# Patient Record
Sex: Female | Born: 1955 | Race: Black or African American | Hispanic: No | Smoking: Never smoker
Health system: Southern US, Community
[De-identification: ages and names within clinical notes are randomized; demographics above are authoritative.]

## PROBLEM LIST (undated history)

## (undated) DIAGNOSIS — R87619 Unspecified abnormal cytological findings in specimens from cervix uteri: Secondary | ICD-10-CM

## (undated) DIAGNOSIS — D573 Sickle-cell trait: Secondary | ICD-10-CM

## (undated) DIAGNOSIS — M1990 Unspecified osteoarthritis, unspecified site: Secondary | ICD-10-CM

## (undated) DIAGNOSIS — D259 Leiomyoma of uterus, unspecified: Secondary | ICD-10-CM

## (undated) DIAGNOSIS — H9192 Unspecified hearing loss, left ear: Secondary | ICD-10-CM

## (undated) DIAGNOSIS — F411 Generalized anxiety disorder: Secondary | ICD-10-CM

## (undated) DIAGNOSIS — D649 Anemia, unspecified: Secondary | ICD-10-CM

## (undated) DIAGNOSIS — F329 Major depressive disorder, single episode, unspecified: Secondary | ICD-10-CM

## (undated) DIAGNOSIS — H269 Unspecified cataract: Secondary | ICD-10-CM

## (undated) DIAGNOSIS — J302 Other seasonal allergic rhinitis: Secondary | ICD-10-CM

## (undated) DIAGNOSIS — N83209 Unspecified ovarian cyst, unspecified side: Secondary | ICD-10-CM

## (undated) DIAGNOSIS — K219 Gastro-esophageal reflux disease without esophagitis: Secondary | ICD-10-CM

## (undated) DIAGNOSIS — M8580 Other specified disorders of bone density and structure, unspecified site: Secondary | ICD-10-CM

## (undated) DIAGNOSIS — E785 Hyperlipidemia, unspecified: Secondary | ICD-10-CM

## (undated) DIAGNOSIS — F32A Depression, unspecified: Secondary | ICD-10-CM

## (undated) HISTORY — DX: Generalized anxiety disorder: F41.1

## (undated) HISTORY — PX: ENDOMETRIAL ABLATION: SHX621

## (undated) HISTORY — DX: Unspecified cataract: H26.9

## (undated) HISTORY — DX: Sickle-cell trait: D57.3

## (undated) HISTORY — DX: Other specified disorders of bone density and structure, unspecified site: M85.80

## (undated) HISTORY — DX: Leiomyoma of uterus, unspecified: D25.9

## (undated) HISTORY — DX: Unspecified abnormal cytological findings in specimens from cervix uteri: R87.619

## (undated) HISTORY — PX: COLONOSCOPY: SHX174

## (undated) HISTORY — PX: POLYPECTOMY: SHX149

## (undated) HISTORY — DX: Unspecified ovarian cyst, unspecified side: N83.209

## (undated) HISTORY — DX: Unspecified hearing loss, left ear: H91.92

## (undated) HISTORY — DX: Gastro-esophageal reflux disease without esophagitis: K21.9

## (undated) HISTORY — PX: MYOMECTOMY: SHX85

## (undated) HISTORY — DX: Other seasonal allergic rhinitis: J30.2

---

## 2001-09-16 ENCOUNTER — Other Ambulatory Visit: Admission: RE | Admit: 2001-09-16 | Discharge: 2001-09-16 | Payer: Self-pay | Admitting: Internal Medicine

## 2006-12-29 DIAGNOSIS — H9192 Unspecified hearing loss, left ear: Secondary | ICD-10-CM

## 2006-12-29 HISTORY — DX: Unspecified hearing loss, left ear: H91.92

## 2008-01-28 ENCOUNTER — Encounter: Payer: Self-pay | Admitting: Gastroenterology

## 2008-01-28 ENCOUNTER — Emergency Department (HOSPITAL_COMMUNITY): Admission: EM | Admit: 2008-01-28 | Discharge: 2008-01-28 | Payer: Self-pay | Admitting: Emergency Medicine

## 2008-02-09 ENCOUNTER — Ambulatory Visit: Payer: Self-pay | Admitting: Gastroenterology

## 2008-02-23 ENCOUNTER — Ambulatory Visit: Payer: Self-pay | Admitting: Gastroenterology

## 2008-02-23 ENCOUNTER — Encounter: Payer: Self-pay | Admitting: Gastroenterology

## 2008-11-30 ENCOUNTER — Encounter: Payer: Self-pay | Admitting: Gastroenterology

## 2009-03-05 ENCOUNTER — Encounter: Payer: Self-pay | Admitting: Gastroenterology

## 2009-03-19 ENCOUNTER — Telehealth: Payer: Self-pay | Admitting: Gastroenterology

## 2009-04-17 ENCOUNTER — Ambulatory Visit: Payer: Self-pay | Admitting: Gastroenterology

## 2009-04-17 DIAGNOSIS — R142 Eructation: Secondary | ICD-10-CM

## 2009-04-17 DIAGNOSIS — R1011 Right upper quadrant pain: Secondary | ICD-10-CM | POA: Insufficient documentation

## 2009-04-17 DIAGNOSIS — R143 Flatulence: Secondary | ICD-10-CM

## 2009-04-17 DIAGNOSIS — R141 Gas pain: Secondary | ICD-10-CM | POA: Insufficient documentation

## 2009-04-17 DIAGNOSIS — K219 Gastro-esophageal reflux disease without esophagitis: Secondary | ICD-10-CM | POA: Insufficient documentation

## 2009-04-24 ENCOUNTER — Ambulatory Visit: Payer: Self-pay | Admitting: Gastroenterology

## 2009-04-25 ENCOUNTER — Telehealth: Payer: Self-pay | Admitting: Gastroenterology

## 2009-09-27 ENCOUNTER — Encounter: Admission: RE | Admit: 2009-09-27 | Discharge: 2009-09-27 | Payer: Self-pay | Admitting: Family Medicine

## 2011-01-19 ENCOUNTER — Encounter: Payer: Self-pay | Admitting: Internal Medicine

## 2011-02-21 ENCOUNTER — Ambulatory Visit
Admission: RE | Admit: 2011-02-21 | Discharge: 2011-02-21 | Disposition: A | Discharge status: Home / Self Care | Payer: 59 | Source: Ambulatory Visit | Attending: Internal Medicine | Admitting: Internal Medicine

## 2011-02-21 ENCOUNTER — Other Ambulatory Visit: Payer: Self-pay | Admitting: Internal Medicine

## 2011-02-27 ENCOUNTER — Emergency Department (HOSPITAL_COMMUNITY)
Admission: EM | Admit: 2011-02-27 | Discharge: 2011-02-27 | Disposition: A | Discharge status: Home / Self Care | Payer: 59 | Attending: Emergency Medicine | Admitting: Emergency Medicine

## 2011-02-27 ENCOUNTER — Emergency Department (HOSPITAL_COMMUNITY): Payer: 59

## 2011-02-27 DIAGNOSIS — R002 Palpitations: Secondary | ICD-10-CM | POA: Insufficient documentation

## 2011-02-27 DIAGNOSIS — F411 Generalized anxiety disorder: Secondary | ICD-10-CM | POA: Insufficient documentation

## 2011-02-27 DIAGNOSIS — R110 Nausea: Secondary | ICD-10-CM | POA: Insufficient documentation

## 2011-02-27 DIAGNOSIS — R000 Tachycardia, unspecified: Secondary | ICD-10-CM | POA: Insufficient documentation

## 2011-02-27 DIAGNOSIS — F410 Panic disorder [episodic paroxysmal anxiety] without agoraphobia: Secondary | ICD-10-CM | POA: Insufficient documentation

## 2011-02-27 DIAGNOSIS — R0602 Shortness of breath: Secondary | ICD-10-CM | POA: Insufficient documentation

## 2011-02-27 DIAGNOSIS — R0989 Other specified symptoms and signs involving the circulatory and respiratory systems: Secondary | ICD-10-CM | POA: Insufficient documentation

## 2011-02-27 DIAGNOSIS — Z79899 Other long term (current) drug therapy: Secondary | ICD-10-CM | POA: Insufficient documentation

## 2011-02-27 DIAGNOSIS — K219 Gastro-esophageal reflux disease without esophagitis: Secondary | ICD-10-CM | POA: Insufficient documentation

## 2011-02-27 DIAGNOSIS — R0609 Other forms of dyspnea: Secondary | ICD-10-CM | POA: Insufficient documentation

## 2011-02-27 LAB — CBC
MCHC: 35.8 g/dL (ref 30.0–36.0)
MCV: 86 fL (ref 78.0–100.0)
Platelets: 247 10*3/uL (ref 150–400)
RDW: 13.3 % (ref 11.5–15.5)
WBC: 8.5 10*3/uL (ref 4.0–10.5)

## 2011-02-27 LAB — BASIC METABOLIC PANEL
BUN: 12 mg/dL (ref 6–23)
Calcium: 9 mg/dL (ref 8.4–10.5)
GFR calc non Af Amer: 52 mL/min — ABNORMAL LOW (ref >60)
Glucose, Bld: 102 mg/dL — ABNORMAL HIGH (ref 70–99)
Potassium: 4.2 mEq/L (ref 3.5–5.1)

## 2011-02-27 LAB — POCT CARDIAC MARKERS
Myoglobin, poc: 62.2 ng/mL (ref 12–200)
Troponin i, poc: <0.05 ng/mL (ref 0.00–0.09)

## 2011-07-07 ENCOUNTER — Other Ambulatory Visit: Payer: Self-pay | Admitting: Obstetrics & Gynecology

## 2011-09-18 LAB — URINALYSIS, ROUTINE W REFLEX MICROSCOPIC
Nitrite: NEGATIVE
Protein, ur: NEGATIVE
Specific Gravity, Urine: 1.008
Urobilinogen, UA: 0.2

## 2011-09-18 LAB — COMPREHENSIVE METABOLIC PANEL
Alkaline Phosphatase: 35 — ABNORMAL LOW
BUN: 7
CO2: 26
Chloride: 103
GFR calc non Af Amer: 58 — ABNORMAL LOW
Glucose, Bld: 112 — ABNORMAL HIGH
Potassium: 3.8
Total Bilirubin: 1
Total Protein: 6.7

## 2011-09-18 LAB — DIFFERENTIAL
Basophils Absolute: 0
Basophils Relative: 0
Eosinophils Absolute: 0
Neutro Abs: 7.6
Neutrophils Relative %: 84 — ABNORMAL HIGH

## 2011-09-18 LAB — CBC
HCT: 44.2
Hemoglobin: 15
MCHC: 34
MCV: 92
Platelets: 239
RBC: 4.8
RDW: 13.3
WBC: 9.1

## 2011-10-18 ENCOUNTER — Emergency Department (HOSPITAL_COMMUNITY)
Admission: EM | Admit: 2011-10-18 | Discharge: 2011-10-18 | Disposition: A | Discharge status: Home / Self Care | Payer: 59 | Attending: Emergency Medicine | Admitting: Emergency Medicine

## 2011-10-18 DIAGNOSIS — F411 Generalized anxiety disorder: Secondary | ICD-10-CM | POA: Insufficient documentation

## 2011-10-18 DIAGNOSIS — K219 Gastro-esophageal reflux disease without esophagitis: Secondary | ICD-10-CM | POA: Insufficient documentation

## 2011-10-18 DIAGNOSIS — IMO0002 Reserved for concepts with insufficient information to code with codable children: Secondary | ICD-10-CM | POA: Insufficient documentation

## 2011-10-18 DIAGNOSIS — S71009A Unspecified open wound, unspecified hip, initial encounter: Secondary | ICD-10-CM | POA: Insufficient documentation

## 2011-10-18 DIAGNOSIS — Z79899 Other long term (current) drug therapy: Secondary | ICD-10-CM | POA: Insufficient documentation

## 2011-10-18 DIAGNOSIS — W540XXA Bitten by dog, initial encounter: Secondary | ICD-10-CM | POA: Insufficient documentation

## 2011-10-18 DIAGNOSIS — S71109A Unspecified open wound, unspecified thigh, initial encounter: Secondary | ICD-10-CM | POA: Insufficient documentation

## 2011-11-17 ENCOUNTER — Ambulatory Visit (INDEPENDENT_AMBULATORY_CARE_PROVIDER_SITE_OTHER): Payer: Managed Care, Other (non HMO) | Admitting: Family Medicine

## 2011-11-17 ENCOUNTER — Encounter: Payer: Self-pay | Admitting: Family Medicine

## 2011-11-17 VITALS — BP 118/70 | HR 72 | Temp 98.0°F | Ht 66.0 in | Wt 156.0 lb

## 2011-11-17 DIAGNOSIS — N390 Urinary tract infection, site not specified: Secondary | ICD-10-CM

## 2011-11-17 DIAGNOSIS — F411 Generalized anxiety disorder: Secondary | ICD-10-CM | POA: Insufficient documentation

## 2011-11-17 DIAGNOSIS — R300 Dysuria: Secondary | ICD-10-CM

## 2011-11-17 LAB — POCT URINALYSIS DIPSTICK
Bilirubin, UA: NEGATIVE
Ketones, UA: NEGATIVE
Urobilinogen, UA: NEGATIVE
pH, UA: 5

## 2011-11-17 MED ORDER — SULFAMETHOXAZOLE-TRIMETHOPRIM 800-160 MG PO TABS: 1.0000 | ORAL_TABLET | Freq: Two times a day (BID) | ORAL | Status: AC

## 2011-11-17 NOTE — Patient Instructions (Signed)
Please take the antibiotics as directed.  Call if your symptoms progress or worsen.  We will contact you with culture results if it is found that you either don't have an infection, or that the prescribed antibiotics aren't effective and need to be changed.   Continue to drink plenty of fluids.  We discussed your anxiety today.  Consider taking a daily medication for prevention, if you continue to have ongoing, daily anxiety. Please follow up to discuss this further if you are interested.

## 2011-11-17 NOTE — Progress Notes (Signed)
5 days ago noticed faint pink on the toilet paper after wiping after urinating. Only occurred one time. She had been traveling, and not drinking adequate fluids.  Denies any discomfort with voiding--no stinging or burning; denies urgency or frequency.  She gets UTI's every 1-2 years, hasn't had one yet this year, but this felt different than her previous UTI's.  Presents to the office today to make sure there is no infection.  Has had an ache on the right side of her umbilicus for about a month, comes and goes.  Also has had some suprapubic discomfort for a while.  Last saw her GYN in July--has known fibroids and ovarian cyst.  Past Medical History  Diagnosis Date  . GERD (gastroesophageal reflux disease)     EGD at [facility]  . Anxiety state, unspecified     on daily xanax for a couple of years  . Fibroid uterus     GYN--Dr. Seymour Bars  . Ovarian cyst   . Sickle cell trait     Past Surgical History  Procedure Date  . Myomectomy     Dr. Seymour Bars  . Endometrial ablation     History   Social History  . Marital Status: Married    Spouse Name: N/A    Number of Children: 1  . Years of Education: N/A   Occupational History  . works from home.  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 Smoking status: Never Smoker   . Smokeless tobacco: Never Used  . Alcohol Use: Yes     2-3 drinks per week.  . Drug Use: No  . Sexually Active: Not on file   Other Topics Concern  . Not on file   Social History Narrative   Just got her Ph.D. In July.  Lives with her husband, mother and 70 year old daughter    Family History  Problem Relation Age of Onset  . Hyperlipidemia Mother   . Hypertension Mother   . Diabetes Mother   . Dementia Mother   . Irritable bowel syndrome Mother   . Alcohol abuse Father   . Kidney disease Father   . Ulcers Father   . Allergies Daughter   . Eczema Daughter   . Cancer Maternal Aunt     breast cancer (60's)  . Cancer  Maternal Uncle 70    colon  . Diabetes Maternal Grandfather   . Stroke Maternal Grandfather     Current outpatient prescriptions:ALPRAZolam (XANAX) 0.25 MG tablet, Take 0.25 mg by mouth every morning.  , Disp: , Rfl: ;  cholecalciferol (VITAMIN D) 1000 UNITS tablet, Take 1,000 Units by mouth daily.  , Disp: , Rfl: ;  cyanocobalamin 100 MCG tablet, Take 100 mcg by mouth daily.  , Disp: , Rfl: ;  cyproheptadine (PERIACTIN) 4 MG tablet, Take 4 mg by mouth at bedtime as needed.  , Disp: , Rfl:  dexlansoprazole (DEXILANT) 60 MG capsule, Take 60 mg by mouth daily.  , Disp: , Rfl: ;  folic acid (FOLVITE) 400 MCG tablet, Take 400 mcg by mouth daily.  , Disp: , Rfl: ;  Iron-Vitamins (GERITOL PO), Take 1 tablet by mouth.  , Disp: , Rfl: ;  Magnesium 500 MG TABS, Take 1 tablet by mouth daily.  , Disp: , Rfl: ;  Probiotic Product (RESTORA) CAPS, Take 1 capsule by mouth daily.  , Disp: , Rfl:  vitamin E 100 UNIT capsule, Take 100 Units by mouth daily.  ,  Disp: , Rfl: ;  zinc gluconate 50 MG tablet, Take 50 mg by mouth daily.  , Disp: , Rfl: ;  sulfamethoxazole-trimethoprim (SEPTRA DS) 800-160 MG per tablet, Take 1 tablet by mouth 2 (two) times daily., Disp: 14 tablet, Rfl: 0  Allergies  Allergen Reactions  . Augmentin Diarrhea   ROS: Denies any fevers.  Has had some lower back pain, thinks due to her mattress, for the last few weeks, intermittent.  Denies h/o kidney stones. No nausea, vomiting, diarrhea or other bowel changes.  No fevers, URI symptoms, shortness of breath, chest pain, headaches, other joint pains, skin rashes or other concerns. +anxiety.  Taking xanax most mornings for anxiety.  Got her PhD over the summer.  Already a little anxious about her only daughter leaving for college next year.  PHYSICAL EXAM: BP 118/70  Pulse 72  Temp(Src) 98 F (36.7 C) (Oral)  Ht 5\' 6"  (1.676 m)  Wt 156 lb (70.761 kg)  BMI 25.18 kg/m2 Well developed, pleasant female in no distress HEENT: PERRL, EOMI,  conjunctiva clear Neck: no lymphadenopathy or thyromegaly Heart: regular rate and rhythm without murmur Lungs: clear bilaterally Abdomen:  Soft.  No organomegaly or mass.  Tender just to the right of umbilicus--slight defect in fascia, but no bulging or evidence of hernia.  Tender in suprapubic area, and slightly to the right.  No masses appreciable Extremities: no edema Skin: no rash Psych: normal mood, affect, hygiene and grooming  Urine dip--trace blood and 2+ leukocytes  ASSESSMENT/PLAN: 1. Urinary tract infection, site not specified  Urine Culture, sulfamethoxazole-trimethoprim (SEPTRA DS) 800-160 MG per tablet  2. Dysuria  POCT Urinalysis Dipstick  3. Anxiety state, unspecified     Discussed anxiety--using xanax daily.  To consider preventative med instead.  Will f/u to discuss in future

## 2011-11-21 LAB — URINE CULTURE

## 2011-11-24 ENCOUNTER — Other Ambulatory Visit: Payer: Self-pay | Admitting: *Deleted

## 2011-11-24 DIAGNOSIS — N390 Urinary tract infection, site not specified: Secondary | ICD-10-CM

## 2011-11-24 MED ORDER — NITROFURANTOIN MONOHYD MACRO 100 MG PO CAPS: 100.0000 mg | ORAL_CAPSULE | Freq: Two times a day (BID) | ORAL | Status: DC

## 2011-12-08 ENCOUNTER — Ambulatory Visit: Payer: Managed Care, Other (non HMO) | Admitting: Family Medicine

## 2011-12-10 ENCOUNTER — Other Ambulatory Visit: Payer: Managed Care, Other (non HMO)

## 2011-12-10 DIAGNOSIS — Z79899 Other long term (current) drug therapy: Secondary | ICD-10-CM

## 2011-12-10 DIAGNOSIS — E7800 Pure hypercholesterolemia, unspecified: Secondary | ICD-10-CM

## 2011-12-10 DIAGNOSIS — E559 Vitamin D deficiency, unspecified: Secondary | ICD-10-CM

## 2011-12-10 LAB — COMPREHENSIVE METABOLIC PANEL
Alkaline Phosphatase: 44 U/L (ref 39–117)
CO2: 28 mEq/L (ref 19–32)
Creat: 1.11 mg/dL — ABNORMAL HIGH (ref 0.50–1.10)
Glucose, Bld: 80 mg/dL (ref 70–99)
Sodium: 140 mEq/L (ref 135–145)
Total Bilirubin: 0.7 mg/dL (ref 0.3–1.2)
Total Protein: 6.8 g/dL (ref 6.0–8.3)

## 2011-12-10 LAB — LIPID PANEL
Cholesterol: 219 mg/dL — ABNORMAL HIGH (ref 0–200)
HDL: 54 mg/dL (ref >39)
Total CHOL/HDL Ratio: 4.1 Ratio
VLDL: 19 mg/dL (ref 0–40)

## 2011-12-10 LAB — MAGNESIUM: Magnesium: 2 mg/dL (ref 1.5–2.5)

## 2011-12-11 LAB — VITAMIN D 25 HYDROXY (VIT D DEFICIENCY, FRACTURES): Vit D, 25-Hydroxy: 48 ng/mL (ref 30–89)

## 2011-12-15 ENCOUNTER — Encounter: Payer: Self-pay | Admitting: Family Medicine

## 2011-12-15 ENCOUNTER — Ambulatory Visit (INDEPENDENT_AMBULATORY_CARE_PROVIDER_SITE_OTHER): Payer: Managed Care, Other (non HMO) | Admitting: Family Medicine

## 2011-12-15 VITALS — BP 104/72 | HR 68 | Ht 66.0 in | Wt 157.0 lb

## 2011-12-15 DIAGNOSIS — E7800 Pure hypercholesterolemia, unspecified: Secondary | ICD-10-CM

## 2011-12-15 DIAGNOSIS — Z0000 Encounter for general adult medical examination without abnormal findings: Secondary | ICD-10-CM

## 2011-12-15 LAB — POCT URINALYSIS DIPSTICK
Bilirubin, UA: NEGATIVE
Glucose, UA: NEGATIVE
Ketones, UA: NEGATIVE
Leukocytes, UA: NEGATIVE
Nitrite, UA: NEGATIVE

## 2011-12-15 NOTE — Progress Notes (Signed)
Laura Kirby is a 55 y.o. female who presents for a complete physical.  She has the following concerns: annual exam, labs done 12/10/11. Dr Seymour Bars is GYN.-up to date on PAP.  Left leg.,dog bite 10/18/11-treated in ER and by former physician, wants to know if you can take a look at it, not sure if it is healing properly  10 days ago had shooting pains in L thigh where dog bite was, resolved on its own after one day.  Some stingling and slight residual pain  F/u labs she had done prior to her appointment.  She has had elevated cholesterol in the past.  Has been eating more Gouda cheese recently.  Lipids from 11/2010 showed TChol 193, LDL 119.  Labs: Lab Results  Component Value Date   CHOL 219* 12/10/2011   HDL 54 12/10/2011   LDLCALC 146* 12/10/2011   TRIG 94 12/10/2011   CHOLHDL 4.1 12/10/2011     Chemistry      Component Value Date/Time   NA 140 12/10/2011 1521   K 4.0 12/10/2011 1521   CL 105 12/10/2011 1521   CO2 28 12/10/2011 1521   BUN 13 12/10/2011 1521   CREATININE 1.11* 12/10/2011 1521   CREATININE 1.10 02/27/2011 0534      Component Value Date/Time   CALCIUM 9.4 12/10/2011 1521   ALKPHOS 44 12/10/2011 1521   AST 17 12/10/2011 1521   ALT 18 12/10/2011 1521   BILITOT 0.7 12/10/2011 1521     Lab Results  Component Value Date   WBC 8.5 02/27/2011   HGB 14.3 02/27/2011   HCT 40.0 02/27/2011   MCV 86.0 02/27/2011   PLT 247 02/27/2011   Vitamin D was normal at 48 Magnesium level normal at 2  Health Maintenance: Immunization History  Administered Date(s) Administered  . Td 11/17/2005   She had tetanus shot given in ER 10/18/11 when she had dog bite (doesn't state whether it was Td or TdaP) Doesn't take flu shots Last Pap smear: July 2012 with Dr. Seymour Bars Last mammogram: July 2012 Last colonoscopy: 2009, + polyps, due again in 2013  Last DEXA: never Dentist: over a year ago Ophtho: last year Exercise: yoga daily.  Not getting cardiovascular  Past Medical  History  Diagnosis Date  . GERD (gastroesophageal reflux disease)     EGD at [facility]  . Anxiety state, unspecified     on daily xanax for a couple of years in past  . Fibroid uterus     GYN--Dr. Seymour Bars  . Ovarian cyst   . Sickle cell trait     Past Surgical History  Procedure Date  . Myomectomy     Dr. Seymour Bars  . Endometrial ablation     History   Social History  . Marital Status: Married    Spouse Name: N/A    Number of Children: 1  . Years of Education: N/A   Occupational History  . works from home.  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 Smoking status: Never Smoker   . Smokeless tobacco: Never Used  . Alcohol Use: Yes     2-3 drinks per week.  . Drug Use: No  . Sexually Active: Yes -- Female partner(s)    Birth Control/ Protection: Post-menopausal   Other Topics Concern  . Not on file   Social History Narrative   Just got her Ph.D. In July.  Lives with her husband, mother and 17 year old daughter  Family History  Problem Relation Age of Onset  . Hyperlipidemia Mother   . Hypertension Mother   . Diabetes Mother   . Dementia Mother   . Irritable bowel syndrome Mother   . Alcohol abuse Father   . Kidney disease Father   . Ulcers Father   . Allergies Daughter   . Eczema Daughter   . Cancer Maternal Aunt     breast cancer (60's)  . Cancer Maternal Uncle 70    colon  . Diabetes Maternal Grandfather   . Stroke Maternal Grandfather    Current Outpatient Prescriptions on File Prior to Visit  Medication Sig Dispense Refill  . cholecalciferol (VITAMIN D) 1000 UNITS tablet Take 1,000 Units by mouth daily.        . cyanocobalamin 100 MCG tablet Take 100 mcg by mouth daily.        . cyproheptadine (PERIACTIN) 4 MG tablet Take 4 mg by mouth at bedtime as needed.        Marland Kitchen dexlansoprazole (DEXILANT) 60 MG capsule Take 60 mg by mouth daily.        . folic acid (FOLVITE) 400 MCG tablet Take 400 mcg by mouth daily.         . Iron-Vitamins (GERITOL PO) Take 1 tablet by mouth.        . Magnesium 500 MG TABS Take 1 tablet by mouth daily.        . Probiotic Product (RESTORA) CAPS Take 1 capsule by mouth daily.        . vitamin E 100 UNIT capsule Take 100 Units by mouth daily.        Marland Kitchen zinc gluconate 50 MG tablet Take 50 mg by mouth daily.        Marland Kitchen ALPRAZolam (XANAX) 0.25 MG tablet Take 0.25 mg by mouth every morning.          Allergies  Allergen Reactions  . Augmentin Diarrhea   ROS: The patient denies anorexia, fever, weight changes, headaches,  vision changes, decreased hearing, ear pain, sore throat, breast concerns, chest pain, palpitations, dizziness, syncope, dyspnea on exertion, cough, swelling, nausea, vomiting, diarrhea, constipation, abdominal pain, melena, hematochezia, indigestion/heartburn, hematuria, incontinence, dysuria, irregular menstrual cycles, vaginal discharge, odor or itch, genital lesions, joint pains, numbness, tingling, weakness, tremor, suspicious skin lesions, depression, anxiety, abnormal bleeding/bruising (has persistent bruise/lesion L thigh from dogbite), or enlarged lymph nodes. Hasn't needed xanax in over a month.  C/o Pain at night L anterior elbow at antecubital fossa, and radiates up the arm to the shoulder.  Only hurts when she lies on her left side.  Symptoms lasted x 2 weeks, and is improving now.  Better since back on her Magnesium.  Has known carpal tunnel on left, and sometimes gets tingling into L hand.  PHYSICAL EXAM: BP 104/72  Pulse 68  Ht 5\' 6"  (1.676 m)  Wt 157 lb (71.215 kg)  BMI 25.34 kg/m2  General Appearance:    Alert, cooperative, no distress, appears stated age  Head:    Normocephalic, without obvious abnormality, atraumatic  Eyes:    PERRL, conjunctiva/corneas clear, EOM's intact, fundi    benign  Ears:    Normal TM's and external ear canals  Nose:   Nares normal, mucosa normal, no drainage or sinus   tenderness  Throat:   Lips, mucosa, and tongue  normal; teeth and gums normal  Neck:   Supple, no lymphadenopathy;  thyroid:  no   enlargement/tenderness/nodules; no carotid   bruit or JVD  Back:    Spine nontender, no curvature, ROM normal, no CVA     tenderness  Lungs:     Clear to auscultation bilaterally without wheezes, rales or     ronchi; respirations unlabored  Chest Wall:    No tenderness or deformity   Heart:    Regular rate and rhythm, S1 and S2 normal, no murmur, rub   or gallop  Breast Exam:    Deferred to GYN  Abdomen:     Soft, non-tender, nondistended, normoactive bowel sounds,    no masses, no hepatosplenomegaly. Mild diffuse tenderness over uterus, slightly nodular (known fibroids)  Genitalia:    Deferred to GYN     Extremities:   No clubbing, cyanosis or edema. Normal Left shoulder and elbow exam--strength 5/5 and no pain with strength testing. L outer thigh--multiple healing puncture wounds with some scar tissue.  Central area is somewhat raised/firm, slightly tender.  No erythema, warmth, induration or fluctuance.  Likely residual hematoma (?)   Pulses:   2+ and symmetric all extremities  Skin:   Skin color, texture, turgor normal, no rashes or lesions  Lymph nodes:   Cervical, supraclavicular, and axillary nodes normal  Neurologic:   CNII-XII intact, normal strength, sensation and gait; reflexes 2+ and sym metric throughout          Psych:   Normal mood, affect, hygiene and grooming.    ASSESSMENT/PLAN:  1. Routine general medical examination at a health care facility  POCT Urinalysis Dipstick, Visual acuity screening  2. Pure hypercholesterolemia  Lipid panel   Low cholesterol diet reviewed. Re-check in 6 months  L arm pain--discussed differential diagnosis with normal exam--?strain, ?cervical disk issues.  Symptoms seems to be resolving.  Trial of NSAID's prn  Discussed monthly self breast exams and yearly mammograms after the age of 52; at least 30 minutes of aerobic activity at least 5 days/week; proper  sunscreen use reviewed; healthy diet, including goals of calcium and vitamin D intake and alcohol recommendations (less than or equal to 1 drink/day) reviewed; regular seatbelt use; changing batteries in smoke detectors.  Immunization recommendations discussed--flu shot declined. To try and find out if Tdap vs Td given in ER so can be entered in Epic.  Colonoscopy recommendations reviewed--UTD

## 2011-12-15 NOTE — Patient Instructions (Addendum)
HEALTH MAINTENANCE RECOMMENDATIONS:  It is recommended that you get at least 30 minutes of aerobic exercise at least 5 days/week (for weight loss, you may need as much as 60-90 minutes). This can be any activity that gets your heart rate up. This can be divided in 10-15 minute intervals if needed, but try and build up your endurance at least once a week.  Weight bearing exercise is also recommended twice weekly.  Eat a healthy diet with lots of vegetables, fruits and fiber.  "Colorful" foods have a lot of vitamins (ie green vegetables, tomatoes, red peppers, etc).  Limit sweet tea, regular sodas and alcoholic beverages, all of which has a lot of calories and sugar.  Up to 1 alcoholic drink daily may be beneficial for women (unless trying to lose weight, watch sugars).  Drink a lot of water.  Calcium recommendations are 1200-1500 mg daily (1500 mg for postmenopausal women or women without ovaries), and vitamin D 1000 IU daily.  This should be obtained from diet and/or supplements (vitamins), and calcium should not be taken all at once, but in divided doses.  Monthly self breast exams and yearly mammograms for women over the age of 49 is recommended.  Sunscreen of at least SPF 30 should be used on all sun-exposed parts of the skin when outside between the hours of 10 am and 4 pm (not just when at beach or pool, but even with exercise, golf, tennis, and yard work!)  Use a sunscreen that says "broad spectrum" so it covers both UVA and UVB rays, and make sure to reapply every 1-2 hours.  Remember to change the batteries in your smoke detectors when changing your clock times in the spring and fall.  Use your seat belt every time you are in a car, and please drive safely and not be distracted with cell phones and texting while driving.  Please see if you can find out if you were given Td or TdaP (type of tetanus shot) at your ER visit so I can enter properly in the computer. Try and cut back on cheese,  creamy things, red meat, butter, egg yolks, etc to help lower your cholesterol.  See info below.  Left arm pain--? Etiology.  If pain persists, trial of anti-inflammatories (ie Aleve or Motrin).  Follow up if symptoms progress or worsen, or new symptoms develop   Cholesterol Control Diet Cholesterol levels in your body are determined significantly by your diet. Cholesterol levels may also be related to heart disease. The following material helps to explain this relationship and discusses what you can do to help keep your heart healthy. Not all cholesterol is bad. Low-density lipoprotein (LDL) cholesterol is the "bad" cholesterol. It may cause fatty deposits to build up inside your arteries. High-density lipoprotein (HDL) cholesterol is "good." It helps to remove the "bad" LDL cholesterol from your blood. Cholesterol is a very important risk factor for heart disease. Other risk factors are high blood pressure, smoking, stress, heredity, and weight. The heart muscle gets its supply of blood through the coronary arteries. If your LDL cholesterol is high and your HDL cholesterol is low, you are at risk for having fatty deposits build up in your coronary arteries. This leaves less room through which blood can flow. Without sufficient blood and oxygen, the heart muscle cannot function properly and you may feel chest pains (angina pectoris). When a coronary artery closes up entirely, a part of the heart muscle may die, causing a heart attack (myocardial infarction).  CHECKING CHOLESTEROL When your caregiver sends your blood to a lab to be analyzed for cholesterol, a complete lipid (fat) profile may be done. With this test, the total amount of cholesterol and levels of LDL and HDL are determined. Triglycerides are a type of fat that circulates in the blood and can also be used to determine heart disease risk. The list below describes what the numbers should be: Test: Total Cholesterol.  Less than 200 mg/dl.    Test: LDL "bad cholesterol."  Less than 100 mg/dl.   Less than 70 mg/dl if you are at very high risk of a heart attack or sudden cardiac death.  Test: HDL "good cholesterol."  Greater than 50 mg/dl for women.   Greater than 40 mg/dl for men.  Test: Triglycerides.  Less than 150 mg/dl.  CONTROLLING CHOLESTEROL WITH DIET Although exercise and lifestyle factors are important, your diet is key. That is because certain foods are known to raise cholesterol and others to lower it. The goal is to balance foods for their effect on cholesterol and more importantly, to replace saturated and trans fat with other types of fat, such as monounsaturated fat, polyunsaturated fat, and omega-3 fatty acids. On average, a person should consume no more than 15 to 17 g of saturated fat daily. Saturated and trans fats are considered "bad" fats, and they will raise LDL cholesterol. Saturated fats are primarily found in animal products such as meats, butter, and cream. However, that does not mean you need to sacrifice all your favorite foods. Today, there are good tasting, low-fat, low-cholesterol substitutes for most of the things you like to eat. Choose low-fat or nonfat alternatives. Choose round or loin cuts of red meat, since these types of cuts are lowest in fat and cholesterol. Chicken (without the skin), fish, veal, and ground [place] breast are excellent choices. Eliminate fatty meats, such as hot dogs and salami. Even shellfish have little or no saturated fat. Have a 3 oz (85 g) portion when you eat lean meat, poultry, or fish. Trans fats are also called "partially hydrogenated oils." They are oils that have been scientifically manipulated so that they are solid at room temperature resulting in a longer shelf life and improved taste and texture of foods in which they are added. Trans fats are found in stick margarine, some tub margarines, cookies, crackers, and baked goods.  When baking and cooking, oils are an  excellent substitute for butter. The monounsaturated oils are especially beneficial since it is believed they lower LDL and raise HDL. The oils you should avoid entirely are saturated tropical oils, such as coconut and palm.  Remember to eat liberally from food groups that are naturally free of saturated and trans fat, including fish, fruit, vegetables, beans, grains (barley, rice, couscous, bulgur wheat), and pasta (without cream sauces).  IDENTIFYING FOODS THAT LOWER CHOLESTEROL  Soluble fiber may lower your cholesterol. This type of fiber is found in fruits such as apples, vegetables such as broccoli, potatoes, and carrots, legumes such as beans, peas, and lentils, and grains such as barley. Foods fortified with plant sterols (phytosterol) may also lower cholesterol. You should eat at least 2 g per day of these foods for a cholesterol lowering effect.  Read package labels to identify low-saturated fats, trans fats free, and low-fat foods at the supermarket. Select cheeses that have only 2 to 3 g saturated fat per ounce. Use a heart-healthy tub margarine that is free of trans fats or partially hydrogenated oil. When buying  baked goods (cookies, crackers), avoid partially hydrogenated oils. Breads and muffins should be made from whole grains (whole-wheat or whole oat flour, instead of "flour" or "enriched flour"). Buy non-creamy canned soups with reduced salt and no added fats.  FOOD PREPARATION TECHNIQUES  Never deep-fry. If you must fry, either stir-fry, which uses very little fat, or use non-stick cooking sprays. When possible, broil, bake, or roast meats, and steam vegetables. Instead of dressing vegetables with butter or margarine, use lemon and herbs, applesauce and cinnamon (for squash and sweet potatoes), nonfat yogurt, salsa, and low-fat dressings for salads.  LOW-SATURATED FAT / LOW-FAT FOOD SUBSTITUTES Meats / Saturated Fat (g)  Avoid: Steak, marbled (3 oz/85 g) / 11 g   Choose: Steak, lean  (3 oz/85 g) / 4 g   Avoid: Hamburger (3 oz/85 g) / 7 g   Choose: Hamburger, lean (3 oz/85 g) / 5 g   Avoid: Ham (3 oz/85 g) / 6 g   Choose: Ham, lean cut (3 oz/85 g) / 2.4 g   Avoid: Chicken, with skin, dark meat (3 oz/85 g) / 4 g   Choose: Chicken, skin removed, dark meat (3 oz/85 g) / 2 g   Avoid: Chicken, with skin, light meat (3 oz/85 g) / 2.5 g   Choose: Chicken, skin removed, light meat (3 oz/85 g) / 1 g  Dairy / Saturated Fat (g)  Avoid: Whole milk (1 cup) / 5 g   Choose: Low-fat milk, 2% (1 cup) / 3 g   Choose: Low-fat milk, 1% (1 cup) / 1.5 g   Choose: Skim milk (1 cup) / 0.3 g   Avoid: Hard cheese (1 oz/28 g) / 6 g   Choose: Skim milk cheese (1 oz/28 g) / 2 to 3 g   Avoid: Cottage cheese, 4% fat (1 cup) / 6.5 g   Choose: Low-fat cottage cheese, 1% fat (1 cup) / 1.5 g   Avoid: Ice cream (1 cup) / 9 g   Choose: Sherbet (1 cup) / 2.5 g   Choose: Nonfat frozen yogurt (1 cup) / 0.3 g   Choose: Frozen fruit bar / trace   Avoid: Whipped cream (1 tbs) / 3.5 g   Choose: Nondairy whipped topping (1 tbs) / 1 g  Condiments / Saturated Fat (g)  Avoid: Mayonnaise (1 tbs) / 2 g   Choose: Low-fat mayonnaise (1 tbs) / 1 g   Avoid: Butter (1 tbs) / 7 g   Choose: Extra light margarine (1 tbs) / 1 g   Avoid: Coconut oil (1 tbs) / 11.8 g   Choose: Olive oil (1 tbs) / 1.8 g   Choose: Corn oil (1 tbs) / 1.7 g   Choose: Safflower oil (1 tbs) / 1.2 g   Choose: Sunflower oil (1 tbs) / 1.4 g   Choose: Soybean oil (1 tbs) / 2.4 g   Choose: Canola oil (1 tbs) / 1 g  Document Released: 12/15/2005 Document Revised: 08/27/2011 Document Reviewed: 06/05/2011 [facility] Patient Information 2012 [place], [place].

## 2012-01-01 ENCOUNTER — Telehealth: Payer: Self-pay | Admitting: *Deleted

## 2012-01-01 NOTE — Telephone Encounter (Signed)
If she has been taking Aleve 2 pills twice daily regularly for at least 10-14 days, then she needs to f/u.  Need to re-exam to determine next step (whether it is EMG-NCV tests, vs MRI vs refer to ortho, vs cortisone shot, vs other)

## 2012-01-01 NOTE — Telephone Encounter (Signed)
Spoke with patient and she states that she only took one dose of Aleve and felt no relief. She will begin taking 2 pills twice daily and see if things get better. If she gets no relief she will schedule follow up OV.

## 2012-01-01 NOTE — Telephone Encounter (Signed)
Patient called and said that during her CPE in December she had discussed with you some arm pain that she was having and it was recommended that she take aleve, she has been taking w/o any relief. She states that she is having tingling/numbness in her left arm from shoulder to fingers, she believes the pain is coming from the inside of her left elbow. She states that she has been taking Bayer 325mg  and this resolves pain for about an hour or so. She really wants to get back to the gym but doesn't feel comfortable doing so until this issue resolves. Is there anything you can suggest?

## 2012-01-07 ENCOUNTER — Other Ambulatory Visit: Payer: Self-pay | Admitting: Family Medicine

## 2012-01-07 NOTE — Telephone Encounter (Signed)
This was in rx requests. I called patient to see if she put this request in and what was going on. She stated that she told you that one of the "side effects" she had while taking this med for her uti was that it cleared up her acne. She is having a terrible acne break out and she has a job interview next week. Is this ok to refill?

## 2012-02-10 ENCOUNTER — Telehealth: Payer: Self-pay | Admitting: Family Medicine

## 2012-02-10 DIAGNOSIS — K219 Gastro-esophageal reflux disease without esophagitis: Secondary | ICD-10-CM

## 2012-02-10 MED ORDER — DEXLANSOPRAZOLE 60 MG PO CPDR: 60.0000 mg | DELAYED_RELEASE_CAPSULE | Freq: Every day | ORAL | Status: DC

## 2012-02-10 NOTE — Telephone Encounter (Signed)
rx sent to pharmacy.  Await forms and chart if needs prior auth.

## 2012-02-10 NOTE — Telephone Encounter (Signed)
Pt wants refill on Dexilant 60 mg qd with [facility] on [address].  She didn't call pharm as under old dr name.  Pt states rx will need prior auth.through [facility].

## 2012-03-03 ENCOUNTER — Other Ambulatory Visit: Payer: Self-pay | Admitting: Family Medicine

## 2012-03-03 DIAGNOSIS — K219 Gastro-esophageal reflux disease without esophagitis: Secondary | ICD-10-CM

## 2012-03-03 NOTE — Telephone Encounter (Signed)
Okay--if they won't authorize without her also trying Nexium, then change to Nexium 40mg  daily.  Advise pt of need to do this, and to notify us if this med isn't as effective as the Dexilant, in which case we can resubmit for prior auth. If Nexium is as effective, then call for further refills. Okay for #30 with 2 refills for trial

## 2012-03-04 MED ORDER — ESOMEPRAZOLE MAGNESIUM 40 MG PO CPDR: 40.0000 mg | DELAYED_RELEASE_CAPSULE | Freq: Every day | ORAL | Status: DC

## 2012-03-04 NOTE — Telephone Encounter (Signed)
PLS SIGN OFF ON MED 

## 2012-03-04 NOTE — Telephone Encounter (Signed)
signed

## 2012-03-04 NOTE — Telephone Encounter (Signed)
PT INFORMED

## 2012-03-05 ENCOUNTER — Telehealth: Payer: Self-pay | Admitting: Family Medicine

## 2012-03-05 NOTE — Telephone Encounter (Signed)
DONE

## 2012-04-05 ENCOUNTER — Telehealth: Payer: Self-pay | Admitting: Family Medicine

## 2012-04-05 NOTE — Telephone Encounter (Signed)
LM

## 2012-04-14 ENCOUNTER — Telehealth: Payer: Self-pay | Admitting: *Deleted

## 2012-04-14 DIAGNOSIS — G4700 Insomnia, unspecified: Secondary | ICD-10-CM

## 2012-04-14 MED ORDER — CYPROHEPTADINE HCL 4 MG PO TABS: 4.0000 mg | ORAL_TABLET | Freq: Every evening | ORAL | Status: DC | PRN

## 2012-04-14 NOTE — Telephone Encounter (Signed)
This is an antihistamine--I need to know what she uses it for (is it for insomnia, to help her sleep? Or for allergies?) so I can map is appropriately in computer.  Does she take it every night, or prn?

## 2012-04-14 NOTE — Telephone Encounter (Signed)
done

## 2012-04-14 NOTE — Telephone Encounter (Signed)
Patient called back and said she takes medication for sleep and #30 usually lasts her about 2 months.

## 2012-04-14 NOTE — Telephone Encounter (Signed)
Patient called and wanted to know if you would refill her cyproheptadine 4mg  qhs to [facility]. This is a medication that was rx'd by her her former doctor.

## 2012-04-14 NOTE — Telephone Encounter (Signed)
Left message for patient to return my call.

## 2012-04-22 ENCOUNTER — Encounter: Payer: Self-pay | Admitting: Family Medicine

## 2012-04-22 ENCOUNTER — Ambulatory Visit (INDEPENDENT_AMBULATORY_CARE_PROVIDER_SITE_OTHER): Payer: Managed Care, Other (non HMO) | Admitting: Family Medicine

## 2012-04-22 VITALS — BP 102/70 | HR 74 | Temp 98.2°F | Ht 65.0 in | Wt 153.0 lb

## 2012-04-22 DIAGNOSIS — M25519 Pain in unspecified shoulder: Secondary | ICD-10-CM

## 2012-04-22 DIAGNOSIS — M25512 Pain in left shoulder: Secondary | ICD-10-CM

## 2012-04-22 MED ORDER — NAPROXEN 500 MG PO TABS: 500.0000 mg | ORAL_TABLET | Freq: Two times a day (BID) | ORAL | Status: DC

## 2012-04-22 NOTE — Progress Notes (Signed)
Patient presents with complaint of L arm pain. She first mentioned the pain at her physical in December.  At that time, she reported pain at night L anterior elbow at antecubital fossa, and radiates up the arm to the shoulder. Only hurt when she laid on her left side. Symptoms lasted x 2 weeks, and is was improving at time of physical, had gotten better since back on her Magnesium. She reports that pain never really resolved.  Had gotten duller, but never resolved.  Pain seems to start at her anterior shoulder, and also has pain at her L elbow--at antecubital fossa and posteriorly, near triceps muscle.  Has sharp pains at L shoulder with certain positions, and radiates down to the elbow.  No numbness/tingling associated with it, and some weakness, related to the pain.  Occurred today when she reached to turn off her alarm, pain with certain positions with yoga (internal rotation).  She tried Aleve, and initially got some relief, even thought it got better for a few days. Used the Aleve nightly, for about a month, although just sporadically at first. Pain has gotten worse over the last month.  Past Medical History  Diagnosis Date  . GERD (gastroesophageal reflux disease)     EGD at [facility]  . Anxiety state, unspecified     on daily xanax for a couple of years in past  . Fibroid uterus     GYN--Dr. Seymour Bars  . Ovarian cyst   . Sickle cell trait    Past Surgical History  Procedure Date  . Myomectomy     Dr. Seymour Bars  . Endometrial ablation    Current Outpatient Prescriptions on File Prior to Visit  Medication Sig Dispense Refill  . cholecalciferol (VITAMIN D) 1000 UNITS tablet Take 1,000 Units by mouth daily.        . cyproheptadine (PERIACTIN) 4 MG tablet Take 1 tablet (4 mg total) by mouth at bedtime as needed.  30 tablet  1  . dexlansoprazole (DEXILANT) 60 MG capsule Take 1 capsule (60 mg total) by mouth daily.  30 capsule  5  . folic acid (FOLVITE) 400 MCG tablet Take 400 mcg by mouth daily.         . Iron-Vitamins (GERITOL PO) Take 1 tablet by mouth.        . Magnesium 500 MG TABS Take 1 tablet by mouth daily.        . Probiotic Product (RESTORA) CAPS Take 1 capsule by mouth daily.        . vitamin E 100 UNIT capsule Take 100 Units by mouth daily.        Marland Kitchen zinc gluconate 50 MG tablet Take 50 mg by mouth daily.        Marland Kitchen ALPRAZolam (XANAX) 0.25 MG tablet Take 0.25 mg by mouth every morning.        . cyanocobalamin 100 MCG tablet Take 100 mcg by mouth daily.        Marland Kitchen esomeprazole (NEXIUM) 40 MG capsule Take 1 capsule (40 mg total) by mouth daily.  30 capsule  2  . nitrofurantoin, macrocrystal-monohydrate, (MACROBID) 100 MG capsule TAKE ONE CAPSULE BY MOUTH TWICE DAILY  14 capsule  0   Allergies  Allergen Reactions  . Augmentin Diarrhea   ROS:  Denies numbness, tingling, fevers, URI symptoms, chest pain, skin rash, or other concerns.  PHYSICAL EXAM: BP 102/70  Pulse 74  Temp(Src) 98.2 F (36.8 C) (Oral)  Ht 5\' 5"  (1.651 m)  Wt 153 lb (  69.4 kg)  BMI 25.46 kg/m2 Well developed, pleasant female in no distress Neck: nontender, FROM L shoulder: Full range of motion with forward flexion and abduction, but painful arc (mostly at full forward flexion/abduction), significant discomfort with decreased range of motion with internal rotation. Normal strength, sensation, no impingement.  Tender at L [facility] joint Pain with internal rotation against resistance Pain with subscapularis testing (but somewhat painful just being in that position with internal rotation of shoulder) Tender over L biceps tendon at elbow, and over muscle belly of triceps muscle.  Heart: regular rate and rhythm Lungs: clear Abdomen nontender Skin: no rash  ASSESSMENT/PLAN: 1. Shoulder pain, left  naproxen (NAPROSYN) 500 MG tablet, DG Shoulder Left   X-ray of L shoulder/AC joint Naproxen BID--risks and NSAID precautions reviewed.  ROM exercises shown.  Avoid yoga poses with internal rotation (listen to body--if  it hurts, don't do it) May need PT, vs referral to ortho if abnormality seen on x-ray,  F/u 2 weeks prn.  Consider PT vs chiro if not improving with these measures, possibly ortho, depending on x-ray results, especially if symptoms seem to be limited to [facility] joint

## 2012-04-22 NOTE — Patient Instructions (Signed)
Take the naproxen twice daily with food, no other pain medications. Tylenol is okay. Get x-ray at your convenience.  Follow up in 2 weeks if not better.

## 2012-05-03 ENCOUNTER — Ambulatory Visit
Admission: RE | Admit: 2012-05-03 | Discharge: 2012-05-03 | Disposition: A | Discharge status: Home / Self Care | Payer: 59 | Source: Ambulatory Visit | Attending: Family Medicine | Admitting: Family Medicine

## 2012-05-03 DIAGNOSIS — M25512 Pain in left shoulder: Secondary | ICD-10-CM

## 2012-05-20 ENCOUNTER — Encounter: Payer: Self-pay | Admitting: Family Medicine

## 2012-05-20 ENCOUNTER — Ambulatory Visit (INDEPENDENT_AMBULATORY_CARE_PROVIDER_SITE_OTHER): Payer: 59 | Admitting: Family Medicine

## 2012-05-20 VITALS — BP 118/78 | HR 72 | Ht 66.0 in | Wt 155.0 lb

## 2012-05-20 DIAGNOSIS — M79609 Pain in unspecified limb: Secondary | ICD-10-CM

## 2012-05-20 DIAGNOSIS — M79602 Pain in left arm: Secondary | ICD-10-CM

## 2012-05-20 MED ORDER — MELOXICAM 15 MG PO TABS: 15.0000 mg | ORAL_TABLET | Freq: Every day | ORAL | Status: DC

## 2012-05-20 NOTE — Patient Instructions (Signed)
Stop the naproxen.  Use the meloxicam in its place--take with food, just once daily.  Continue stretches and heat vs ice. Call if you don't hear about physical therapy appointment in the next week.  If not getting better with PT, I will likely refer you to either Dr. Enid Baas (sports medicine) vs orthopedist.

## 2012-05-20 NOTE — Progress Notes (Signed)
Chief Complaint  Patient presents with  . Follow-up    2 week follow-u on left shoulder pain   HPI:  Pain in L shoulder and AC joint--treated with naproxen.  Shown stretches.  X-rays showed minimal degenerative changes at L [facility] joint.  For the most part was taking the medication very regularly, and it seems to dull the pain, but pain persists.  Continues to have pain at her L elbow, mostly at antecubital fossa, but up into biceps and triceps also.  Denies neck pain.  Medicine dulls the shoulder pain more than the elbow pain--the pain in anterior elbow sometimes keeps her awake at night. ROM of shoulder seems better than previously.  Past Medical History  Diagnosis Date  . GERD (gastroesophageal reflux disease)     EGD at [facility]  . Anxiety state, unspecified     on daily xanax for a couple of years in past  . Fibroid uterus     GYN--Dr. Seymour Bars  . Ovarian cyst   . Sickle cell trait    Past Surgical History  Procedure Date  . Myomectomy     Dr. Seymour Bars  . Endometrial ablation    Current Outpatient Prescriptions on File Prior to Visit  Medication Sig Dispense Refill  . ALPRAZolam (XANAX) 0.25 MG tablet Take 0.25 mg by mouth every morning.        . cholecalciferol (VITAMIN D) 1000 UNITS tablet Take 1,000 Units by mouth daily.        . cyanocobalamin 100 MCG tablet Take 100 mcg by mouth daily.        . cyproheptadine (PERIACTIN) 4 MG tablet Take 1 tablet (4 mg total) by mouth at bedtime as needed.  30 tablet  1  . dexlansoprazole (DEXILANT) 60 MG capsule Take 1 capsule (60 mg total) by mouth daily.  30 capsule  5  . esomeprazole (NEXIUM) 40 MG capsule Take 1 capsule (40 mg total) by mouth daily.  30 capsule  2  . folic acid (FOLVITE) 400 MCG tablet Take 400 mcg by mouth daily.        . Iron-Vitamins (GERITOL PO) Take 1 tablet by mouth.        . Magnesium 500 MG TABS Take 1 tablet by mouth daily.        . nitrofurantoin, macrocrystal-monohydrate, (MACROBID) 100 MG capsule TAKE ONE CAPSULE  BY MOUTH TWICE DAILY  14 capsule  0  . Probiotic Product (RESTORA) CAPS Take 1 capsule by mouth daily.        . vitamin E 100 UNIT capsule Take 100 Units by mouth daily.        Marland Kitchen zinc gluconate 50 MG tablet Take 50 mg by mouth daily.         Allergies  Allergen Reactions  . Amoxicillin-Pot Clavulanate Diarrhea   ROS:  Denies GI complaints, fevers, numbness, tingling, weakness, neck pain, skin rashes, bleeding or other concerns  PHYSICAL EXAM: BP 118/78  Pulse 72  Ht 5\' 6"  (1.676 m)  Wt 155 lb (70.308 kg)  BMI 25.02 kg/m2 Well developed, pleasant female in no distress LUE:  FROM of shoulder--some discomfort but much improved. Pain with internal rotation against resistance.  Strength is normal. Pain with subscapularis testing. nontender at anterior shoulder, just at L [facility] joint, mildly tender.  Area of pain is antecubital fossa, biceps tendon, and triceps muscle, however isn't tender on exam currently  ASSESSMENT/PLAN: 1. Arm pain, left  Ambulatory referral to Physical Therapy, meloxicam (MOBIC) 15 MG tablet  L shoulder and arm/elbow pain.  Mild improvement with naproxen, but still ongoing daily symptoms.  Has component of mild AC joint arthritis, and biceps tendinitis.  Change to mobic and refer to PT.  Consider Dr. Enid Baas vs ortho if not improving with these measures.

## 2012-05-26 ENCOUNTER — Telehealth: Payer: Self-pay | Admitting: Internal Medicine

## 2012-05-26 NOTE — Telephone Encounter (Signed)
Pt is to be referred to PT for shoulder pain. She is scheduled at [facility], Tuesday June 4 @2 :15pm. Address is [address] [place]. Phone # to office is [phone]. Pt was notified and aware of appt

## 2012-06-01 ENCOUNTER — Ambulatory Visit: Payer: 59 | Admitting: Rehabilitative and Restorative Service Providers"

## 2012-07-05 ENCOUNTER — Other Ambulatory Visit: Payer: Self-pay | Admitting: Family Medicine

## 2012-07-05 NOTE — Telephone Encounter (Signed)
Is this okay to refill? 

## 2012-07-05 NOTE — Telephone Encounter (Signed)
done

## 2012-07-19 LAB — HM MAMMOGRAPHY: HM Mammogram: NORMAL

## 2012-07-22 ENCOUNTER — Encounter: Payer: Self-pay | Admitting: Internal Medicine

## 2012-08-10 ENCOUNTER — Telehealth: Payer: Self-pay

## 2012-08-10 DIAGNOSIS — K219 Gastro-esophageal reflux disease without esophagitis: Secondary | ICD-10-CM

## 2012-08-10 MED ORDER — ESOMEPRAZOLE MAGNESIUM 40 MG PO CPDR: 40.0000 mg | DELAYED_RELEASE_CAPSULE | Freq: Every day | ORAL | Status: DC

## 2012-08-10 NOTE — Telephone Encounter (Signed)
We have never evaluated for hearing loss--might be worth having OV at our office to make sure isn't something as simple as cerumen impaction, which we could take care of.  If this has been an ongoing problem that is worsening (and just not documented in chart), then okay for referral.

## 2012-08-10 NOTE — Telephone Encounter (Signed)
Called pt for her to call me back and let me know for sure that this has been on going so I can make referral

## 2012-08-10 NOTE — Telephone Encounter (Signed)
PT CALLED AND SAID SHE NEEDED A REFERRAL TO DR.MAY AT [facility] [phone] FOR HER HEARING IN LEFT EAR HAS DIMINISHED EVEN MORE PLEASE ADVISE IF THIS IS OK THANK YOU

## 2012-08-10 NOTE — Telephone Encounter (Signed)
Pt called me back she was DX in 2008 and has seen Dr.May before but needs a new referral so I faxed a new referral to [facility] office fax # [phone] and phone # [phone]

## 2012-08-10 NOTE — Telephone Encounter (Signed)
PT CALLED AND ASKED FOR SOME SAMPLES GAVE HER 28 DAYS

## 2012-09-16 ENCOUNTER — Telehealth: Payer: Self-pay | Admitting: Internal Medicine

## 2012-09-16 NOTE — Telephone Encounter (Signed)
Done sl 

## 2012-10-21 ENCOUNTER — Other Ambulatory Visit: Payer: Self-pay | Admitting: Family Medicine

## 2012-12-15 ENCOUNTER — Other Ambulatory Visit: Payer: Self-pay | Admitting: Family Medicine

## 2012-12-15 NOTE — Telephone Encounter (Signed)
She should schedule CPE, where it will be written for a year.  Refill until appt

## 2012-12-15 NOTE — Telephone Encounter (Signed)
Is this okay to refill? Her last CPE was 11/2011.

## 2012-12-17 ENCOUNTER — Telehealth: Payer: Self-pay | Admitting: Family Medicine

## 2012-12-17 ENCOUNTER — Other Ambulatory Visit: Payer: Self-pay | Admitting: Medical

## 2012-12-17 DIAGNOSIS — K219 Gastro-esophageal reflux disease without esophagitis: Secondary | ICD-10-CM

## 2012-12-17 MED ORDER — DEXLANSOPRAZOLE 60 MG PO CPDR: 60.0000 mg | DELAYED_RELEASE_CAPSULE | Freq: Every day | ORAL | Status: DC

## 2012-12-17 NOTE — Telephone Encounter (Signed)
Patient needs refill on Dexilant, patient schedule for CPE in March   [facility] [address]

## 2012-12-27 ENCOUNTER — Ambulatory Visit (INDEPENDENT_AMBULATORY_CARE_PROVIDER_SITE_OTHER): Payer: 59 | Admitting: Medical

## 2012-12-27 ENCOUNTER — Encounter: Payer: Self-pay | Admitting: Medical

## 2012-12-27 VITALS — BP 98/60 | HR 72 | Temp 98.7°F | Resp 16 | Wt 152.0 lb

## 2012-12-27 DIAGNOSIS — J111 Influenza due to unidentified influenza virus with other respiratory manifestations: Secondary | ICD-10-CM

## 2012-12-27 LAB — POCT INFLUENZA A/B: Influenza A, POC: NEGATIVE

## 2012-12-27 MED ORDER — OSELTAMIVIR PHOSPHATE 75 MG PO CAPS: 75.0000 mg | ORAL_CAPSULE | Freq: Two times a day (BID) | ORAL | Status: DC

## 2012-12-27 NOTE — Progress Notes (Signed)
Subjective:  Laura Kirby is a 56 y.o. female who presents for evaluation of influenza like symptoms. Symptoms include chills, dry cough, headache, myalgias, sore throat and fever and have been present for 1 day. She has tried to alleviate the symptoms with acetaminophen with minimal relief.  Been around daughter who has been sick, home from college.  No SOB, wheezing, chest pain, no nausea, vomiting, diarrhea, sinus pressure.  She normally does not get flu vaccine.   Past Medical History  Diagnosis Date  . GERD (gastroesophageal reflux disease)     EGD at [facility]  . Anxiety state, unspecified     on daily xanax for a couple of years in past  . Fibroid uterus     GYN--Dr. Seymour Bars  . Ovarian cyst   . Sickle cell trait    ROS as in subjective     Objective:    General: Ill-appearing, well-developed, well-nourished Skin: warm, dry HEENT: Nose inflamed and congested, clear conjunctiva, TMs pearly, no sinus tenderness, pharynx with erythema, no exudates Neck: Supple, nontender, shotty cervical adenopathy Heart: Regular rate and rhythm, normal S1, S2, no murmurs Lungs: Clear to auscultation bilaterally, no wheezes, rales, rhonchi Extremities: Mild generalized tenderness      Assessment and Plan:   Encounter Diagnosis  Name Primary?  . Influenza Yes   Flu test negative, but advised of possibility of false negative results.   Clinically symptoms and exam suggest influenza like illness, and we will thus treat as such.  Discussed diagnosis of influenza.  prescription given for Tamiflu, discussed risks/benefits of medication.  Discussed supportive care including rest, hydration, OTC Tylenol or NSAID for fever, aches, and malaise.  Discussed period of contagion, self quarantine at home away from others to avoid spread of disease, discussed means of transmission, and possible complications including pneumonia.  If worse or not improving within the next 4-5 days, then call or  return.

## 2013-01-17 ENCOUNTER — Encounter: Payer: Self-pay | Admitting: Gastroenterology

## 2013-01-26 HISTORY — PX: STAPEDES SURGERY: SHX789

## 2013-03-03 ENCOUNTER — Ambulatory Visit (INDEPENDENT_AMBULATORY_CARE_PROVIDER_SITE_OTHER): Payer: 59 | Admitting: Family Medicine

## 2013-03-03 ENCOUNTER — Encounter: Payer: Self-pay | Admitting: Family Medicine

## 2013-03-03 VITALS — BP 118/74 | HR 68 | Ht 65.25 in | Wt 153.0 lb

## 2013-03-03 DIAGNOSIS — K219 Gastro-esophageal reflux disease without esophagitis: Secondary | ICD-10-CM

## 2013-03-03 DIAGNOSIS — Z0000 Encounter for general adult medical examination without abnormal findings: Secondary | ICD-10-CM

## 2013-03-03 DIAGNOSIS — F329 Major depressive disorder, single episode, unspecified: Secondary | ICD-10-CM

## 2013-03-03 DIAGNOSIS — F3289 Other specified depressive episodes: Secondary | ICD-10-CM

## 2013-03-03 DIAGNOSIS — R3915 Urgency of urination: Secondary | ICD-10-CM

## 2013-03-03 DIAGNOSIS — E7800 Pure hypercholesterolemia, unspecified: Secondary | ICD-10-CM

## 2013-03-03 LAB — LIPID PANEL
Cholesterol: 241 mg/dL — ABNORMAL HIGH (ref 0–200)
LDL Cholesterol: 152 mg/dL — ABNORMAL HIGH (ref 0–99)
VLDL: 28 mg/dL (ref 0–40)

## 2013-03-03 LAB — COMPREHENSIVE METABOLIC PANEL
Albumin: 3.8 g/dL (ref 3.5–5.2)
Alkaline Phosphatase: 61 U/L (ref 39–117)
Glucose, Bld: 87 mg/dL (ref 70–99)
Potassium: 3.9 mEq/L (ref 3.5–5.3)
Sodium: 139 mEq/L (ref 135–145)
Total Protein: 6.8 g/dL (ref 6.0–8.3)

## 2013-03-03 LAB — POCT URINALYSIS DIPSTICK
Glucose, UA: NEGATIVE
Ketones, UA: NEGATIVE
Spec Grav, UA: <=1.005
Urobilinogen, UA: NEGATIVE

## 2013-03-03 MED ORDER — ESCITALOPRAM OXALATE 10 MG PO TABS: ORAL_TABLET | ORAL | Status: DC

## 2013-03-03 NOTE — Patient Instructions (Addendum)
Consider calling Dr. Donell Beers for your mother (psychiatrist) Counseling (for you) is strongly encouraged.  I recommend Berniece Andreas (with [facility], on [address], near [address]).  Start lexapro at 1/2 tablet once daily, increase to full tablet if tolerating at a week.  If you need to take 1/2 tablet longer, that's okay. Ultimately full tablet will likely be more effective.  Schedule your colonoscopy.   HEALTH MAINTENANCE RECOMMENDATIONS:  It is recommended that you get at least 30 minutes of aerobic exercise at least 5 days/week (for weight loss, you may need as much as 60-90 minutes). This can be any activity that gets your heart rate up. This can be divided in 10-15 minute intervals if needed, but try and build up your endurance at least once a week.  Weight bearing exercise is also recommended twice weekly.  Eat a healthy diet with lots of vegetables, fruits and fiber.  "Colorful" foods have a lot of vitamins (ie green vegetables, tomatoes, red peppers, etc).  Limit sweet tea, regular sodas and alcoholic beverages, all of which has a lot of calories and sugar.  Up to 1 alcoholic drink daily may be beneficial for women (unless trying to lose weight, watch sugars).  Drink a lot of water.  Calcium recommendations are 1200-1500 mg daily (1500 mg for postmenopausal women or women without ovaries), and vitamin D 1000 IU daily.  This should be obtained from diet and/or supplements (vitamins), and calcium should not be taken all at once, but in divided doses.  Monthly self breast exams and yearly mammograms for women over the age of 59 is recommended.  Sunscreen of at least SPF 30 should be used on all sun-exposed parts of the skin when outside between the hours of 10 am and 4 pm (not just when at beach or pool, but even with exercise, golf, tennis, and yard work!)  Use a sunscreen that says "broad spectrum" so it covers both UVA and UVB rays, and make sure to reapply every 1-2 hours.  Remember to  change the batteries in your smoke detectors when changing your clock times in the spring and fall.  Use your seat belt every time you are in a car, and please drive safely and not be distracted with cell phones and texting while driving.

## 2013-03-03 NOTE — Progress Notes (Addendum)
Chief Complaint  Patient presents with  . Annual Exam    fasting annual exam without pap-sees Dr.Lavoie and is up to date. UA showed 3+leuks and trace blood, patient is asymptomatic except for some urgency. Patient was on prednisone x 8days post ear surgery and states that she felt great and wants to know if there anything that she can take that would replicate the way she felt on those. Also having some issues with depression as her mother was just diagnosed with dementia.   Laura Kirby is a 57 y.o. female who presents for a complete physical.  She has the following concerns:  Feeling hopeless and helpless with regards to being a caregiver for her mother, her healthcare.  She is going to support groups, learning more.  Mother is in denial.  Accusing her of stealing, etc.  Refusing to give up driving.  She is feeling inadequate.  She is not crying, feels unable to cry.  Trouble getting things done in a timely fashion (prednisone helped).  She wants medications, asking for something that would mimic how the prednisone made her feel, or at least an anti-depressant.  Lost about 9 pounds (158 to 149 prior to recent use of prednisone).  Has decreased appetite.  H/o eating disorder when younger.  Admits to not eating when stressed.  Immunization History  Administered Date(s) Administered  . Td 11/17/2005  tetanus give in ED 10/18/11--documentation in computer doesn't say which type given Doesn't take flu shots  Last Pap smear: June 2013 with Dr. Seymour Bars  Last mammogram: June 2013 Last colonoscopy: 2009, + polyps, due again in 2013.  Plans to schedule for near future Last DEXA: never  Dentist: twice a year Ophtho: yearly Exercise: yoga daily, and getting cardio just once a week.  Hasn't done any exercise since recent ear surgery.  Past Medical History  Diagnosis Date  . GERD (gastroesophageal reflux disease)     EGD at [facility]  . Anxiety state, unspecified     on daily xanax for a  couple of years in past  . Fibroid uterus     GYN--Dr. Seymour Bars  . Ovarian cyst   . Sickle cell trait   . Hearing loss in left ear 2008    s/p surgery 12/2012, has persistent hearing loss    Past Surgical History  Procedure Laterality Date  . Myomectomy      Dr. Seymour Bars  . Endometrial ablation    . Stapedes surgery Left 01/26/13    at [facility]; otosclerosis; persistent L hearing loss after surgery    History   Social History  . Marital Status: Married    Spouse Name: N/A    Number of Children: 1  . Years of Education: N/A   Occupational History  . works from home.  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 Smoking status: Never Smoker   . Smokeless tobacco: Never Used  . Alcohol Use: Yes     Comment: 2-3 drinks per week.  . Drug Use: No  . Sexually Active: Yes -- Female partner(s)    Birth Control/ Protection: Post-menopausal   Other Topics Concern  . Not on file   Social History Narrative   Got Ph.D. In 06/2011.  Lives with her husband, mother and daughter (in college).   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    Family History  Problem Relation Age of Onset  . Hyperlipidemia Mother   .  Hypertension Mother   . Diabetes Mother   . Dementia Mother   . Irritable bowel syndrome Mother   . Alcohol abuse Father   . Kidney disease Father   . Ulcers Father   . Allergies Daughter   . Eczema Daughter   . Cancer Maternal Aunt     breast cancer (60's)  . Cancer Maternal Uncle 70    colon  . Diabetes Maternal Grandfather   . Stroke Maternal Grandfather     Current outpatient prescriptions:Calcium Carbonate-Vit D-Min (CALCIUM 1200 PO), Take 1 each by mouth daily., Disp: , Rfl: ;  cyanocobalamin 100 MCG tablet, Take 100 mcg by mouth daily.  , Disp: , Rfl: ;  cyproheptadine (PERIACTIN) 4 MG tablet, TAKE ONE TABLET BY MOUTH AT BEDTIME AS NEEDED, Disp: 30 tablet, Rfl: 2;   dexlansoprazole (DEXILANT) 60 MG capsule, Take 1 capsule (60 mg total) by mouth daily., Disp: 30 capsule, Rfl: 3 folic acid (FOLVITE) 400 MCG tablet, Take 400 mcg by mouth daily.  , Disp: , Rfl: ;  Iron-Vitamins (GERITOL PO), Take 1 tablet by mouth.  , Disp: , Rfl: ;  Magnesium 500 MG TABS, Take 1 tablet by mouth daily.  , Disp: , Rfl: ;  Probiotic Product (RESTORA) CAPS, Take 1 capsule by mouth daily.  , Disp: , Rfl: ;  vitamin D, CHOLECALCIFEROL, 400 UNITS tablet, Take 400 Units by mouth daily., Disp: , Rfl:  zinc gluconate 50 MG tablet, Take 50 mg by mouth daily.  , Disp: , Rfl: ;  escitalopram (LEXAPRO) 10 MG tablet, Start at 1/2 tablet for the first week, then increase to full tablet, Disp: 30 tablet, Rfl: 1;  vitamin E 100 UNIT capsule, Take 100 Units by mouth daily.  , Disp: , Rfl:  (Lexapro started at visit, NOT prior to today's encounter)  Allergies  Allergen Reactions  . Amoxicillin-Pot Clavulanate Diarrhea      ROS: The patient denies fever, headaches, vision changes, sore throat, breast concerns, chest pain, palpitations, dizziness, syncope, dyspnea on exertion, cough, swelling, nausea, vomiting, diarrhea, constipation, abdominal pain, melena, hematochezia, indigestion/heartburn, hematuria, incontinence, dysuria but having some urinary urgency, vaginal bleeding, discharge, odor or itch, joint pains, numbness, tingling, weakness, tremor, suspicious skin lesions.  See HPI--decreased L hearing, depression, decreased appetite, weight loss  PHYSICAL EXAM: BP 118/74  Pulse 68  Ht 5' 5.25" (1.657 m)  Wt 153 lb (69.4 kg)  BMI 25.28 kg/m2 General Appearance:  Alert, cooperative, no distress, appears stated age   Head:  Normocephalic, without obvious abnormality, atraumatic   Eyes:  PERRL, conjunctiva/corneas clear, EOM's intact, fundi  benign   Ears:  Normal TM's and external ear canals--very small canals.  Scarring on L TM  Nose:  Nares normal, mucosa normal, no drainage or sinus  tenderness   Throat:  Lips, mucosa, and tongue normal; teeth and gums normal   Neck:  Supple, no lymphadenopathy; thyroid: no enlargement/tenderness/nodules; no carotid  bruit or JVD   Back:  Spine nontender, no curvature, ROM normal, no CVA tenderness   Lungs:  Clear to auscultation bilaterally without wheezes, rales or ronchi; respirations unlabored   Chest Wall:  No tenderness or deformity   Heart:  Regular rate and rhythm, S1 and S2 normal, no murmur, rub  or gallop   Breast Exam:  Deferred to GYN   Abdomen:  Soft, non-tender, nondistended, normoactive bowel sounds,  no masses, no hepatosplenomegaly. Mild diffuse tenderness over uterus/suprapubic, slightly nodular (known fibroids)  Genitalia:  Deferred to GYN  Extremities:  No clubbing, cyanosis or edema. 2+ PT and DP pulses  Pulses:  2+ and symmetric all extremities   Skin:  Skin color, texture, turgor normal, no rashes or lesions   Lymph nodes:  Cervical, supraclavicular, and axillary nodes normal   Neurologic:  CNII-XII intact, normal strength, sensation and gait; reflexes 2+ and sym metric throughout   Psych: Normal affect, hygiene and grooming. She appears frustrated, but full range of affect.  Depressed mood  ASSESSMENT/PLAN:  Routine general medical examination at a health care facility - Plan: Visual acuity screening, POCT Urinalysis Dipstick, Comprehensive metabolic panel  GERD  Depressive disorder, not elsewhere classified - Plan: escitalopram (LEXAPRO) 10 MG tablet, TSH  Pure hypercholesterolemia - Plan: TSH, Lipid panel  Urinary urgency - Plan: Urine culture  Sign release to get nursing records for 10/18/11 ER visit, since documentation of type of tetanus not in computer.  Discussed monthly self breast exams and yearly mammograms after the age of 72; at least 30 minutes of aerobic activity at least 5 days/week; proper sunscreen use reviewed; healthy diet, including goals of calcium and vitamin D intake and  alcohol recommendations (less than or equal to 1 drink/day) reviewed; regular seatbelt use; changing batteries in smoke detectors.  Immunization recommendations discussed--she declines flu shots.  Colonoscopy recommendations reviewed--past due, encouraged to schedule.  Briefly discussed shingles vaccine--can return if covered by insurance, otherwise plan when 61 (or covered). Yearly flu shots recommended Schedule colonoscopy  Depression, situational related to stress of mother with dementia.  Encouraged counseling.  Recommended Dr. Alisia Ferrari for her mother.  She was insistent on starting medications.  We discussed target symptoms of treatment--unmotivation, hopeless/helpless, decreased appetite/weight loss.  Reviewed possible side effects.  Return in 4-6 weeks for f/u.

## 2013-03-04 ENCOUNTER — Encounter: Payer: Self-pay | Admitting: Family Medicine

## 2013-03-04 DIAGNOSIS — E7800 Pure hypercholesterolemia, unspecified: Secondary | ICD-10-CM | POA: Insufficient documentation

## 2013-03-04 DIAGNOSIS — F325 Major depressive disorder, single episode, in full remission: Secondary | ICD-10-CM | POA: Insufficient documentation

## 2013-03-06 LAB — URINE CULTURE: Colony Count: >=100000

## 2013-03-07 ENCOUNTER — Telehealth: Payer: Self-pay | Admitting: Family Medicine

## 2013-03-07 ENCOUNTER — Encounter: Payer: Self-pay | Admitting: *Deleted

## 2013-03-07 DIAGNOSIS — N390 Urinary tract infection, site not specified: Secondary | ICD-10-CM

## 2013-03-07 DIAGNOSIS — F411 Generalized anxiety disorder: Secondary | ICD-10-CM

## 2013-03-07 MED ORDER — ALPRAZOLAM 0.25 MG PO TABS: 0.2500 mg | ORAL_TABLET | Freq: Three times a day (TID) | ORAL | Status: DC | PRN

## 2013-03-07 MED ORDER — NITROFURANTOIN MONOHYD MACRO 100 MG PO CAPS: 100.0000 mg | ORAL_CAPSULE | Freq: Two times a day (BID) | ORAL | Status: DC

## 2013-03-07 NOTE — Telephone Encounter (Signed)
Advise pt okay for alprazolam 0.25mg  to use q8hrs prn anxiety.  #20, no refill.  This is only to be used for significant anxiety, will not help with the symptoms we talked about at her visit.  Remind her of possible sedation from med, not to mix with alcohol, and use caution with driving if causes sedation.  She can try cutting the dose of lexapro back further (quarter tablet) and slowly gradually increase as tolerated.  She can try taking it different times of day--perhaps she wouldn't notice the nausea as much if she took it at bedtime, vs trying to take it with food/meals instead of on empty stomach.  She can continue to experiment with how to take to make tolerable.  The xanax is not a replacement medication

## 2013-03-07 NOTE — Telephone Encounter (Signed)
New medication, she is not able to take, causing nausea  Please call Pt wants Rx for xanax  [facility], [address]

## 2013-03-07 NOTE — Telephone Encounter (Signed)
Medication phoned in to [facility] [address] and left detailed message for her with Dr.Knapp's recommendations.

## 2013-03-10 ENCOUNTER — Ambulatory Visit: Payer: 59 | Admitting: Family Medicine

## 2013-03-10 ENCOUNTER — Encounter: Payer: Self-pay | Admitting: Family Medicine

## 2013-03-10 VITALS — BP 120/82 | HR 72 | Ht 65.25 in | Wt 151.0 lb

## 2013-03-10 DIAGNOSIS — G44209 Tension-type headache, unspecified, not intractable: Secondary | ICD-10-CM

## 2013-03-10 DIAGNOSIS — N390 Urinary tract infection, site not specified: Secondary | ICD-10-CM

## 2013-03-10 DIAGNOSIS — F411 Generalized anxiety disorder: Secondary | ICD-10-CM

## 2013-03-10 DIAGNOSIS — F329 Major depressive disorder, single episode, unspecified: Secondary | ICD-10-CM

## 2013-03-10 DIAGNOSIS — F3289 Other specified depressive episodes: Secondary | ICD-10-CM

## 2013-03-10 NOTE — Progress Notes (Signed)
Chief Complaint  Patient presents with  . Headache    started Monday but has had one every day this week. Would go away and then came back each day at lunchtime. Has to leave work yesterday, wetnt home an took Environmental education officer, felt better last night. Does not have a HA today, just some dullness, residual pain-head is very stuff today.    She started with a headache 3 days ago at lunchtime on the right side of her face, at the temple.  Having some nasal congestion, some runny nose.  Denies fevers.   She took Aleve at work, and headache resolved.  Same thing happened at work the following day. Yesterday headache started prior to lunch, and Aleve dulled the headache, didn't completely resolve, and recurred later in the day with nausea. She left work yesterday afternoon.  Took 2 Vanquish (OTC), went to bed, and headache resolved.  She awoke at 8 last night, and headache was better. She feels like headache now is "about to recur". +family h/o migraines in her mother.  She had similar headache to the severity of yesterday's headache (with nausea) prior to her cycles in the past.  +clenches her teeth, has been under stress. She is up for a promotion at work, and had to cancel job interview today due to headache (r/s for tomorrow).  She has had some numbness in her left arm, which she associates with a shoulder problem that she has had.  She denies any neck pain, but has some stress that she holds in her neck..  Not numb now.  Depression/Anxiety: Took lexapro just twice (1/2 tablet), but stopped due nausea.  Hasn't called to schedule counseling yet. UTI--enterococcus on urine culture. Pt had some urinary urgency.  Taking ABX as prescribed and symptoms have resolved, although ABX may be contributing some to nausea.  Past Medical History  Diagnosis Date  . GERD (gastroesophageal reflux disease)     EGD at [facility]  . Anxiety state, unspecified     on daily xanax for a couple of years in past  . Fibroid uterus      GYN--Dr. Seymour Bars  . Ovarian cyst   . Sickle cell trait   . Hearing loss in left ear 2008    s/p surgery 12/2012, has persistent hearing loss   Past Surgical History  Procedure Laterality Date  . Myomectomy      Dr. Seymour Bars  . Endometrial ablation    . Stapedes surgery Left 01/26/13    at [facility]; otosclerosis; persistent L hearing loss after surgery   History   Social History  . Marital Status: Married    Spouse Name: N/A    Number of Children: 1  . Years of Education: N/A   Occupational History  . works from home.  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 Smoking status: Never Smoker   . Smokeless tobacco: Never Used  . Alcohol Use: Yes     Comment: 2-3 drinks per week.  . Drug Use: No  . Sexually Active: Yes -- Female partner(s)    Birth Control/ Protection: Post-menopausal   Other Topics Concern  . Not on file   Social History Narrative   Got Ph.D. In 06/2011.  Lives with her husband, mother and daughter (in college).   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   Current Outpatient Prescriptions on File Prior to Visit  Medication Sig Dispense Refill  .  ALPRAZolam (XANAX) 0.25 MG tablet Take 1 tablet (0.25 mg total) by mouth every 8 (eight) hours as needed for sleep.  20 tablet  0  . Calcium Carbonate-Vit D-Min (CALCIUM 1200 PO) Take 1 each by mouth daily.      . cyanocobalamin 100 MCG tablet Take 100 mcg by mouth daily.        . cyproheptadine (PERIACTIN) 4 MG tablet TAKE ONE TABLET BY MOUTH AT BEDTIME AS NEEDED  30 tablet  2  . dexlansoprazole (DEXILANT) 60 MG capsule Take 1 capsule (60 mg total) by mouth daily.  30 capsule  3  . escitalopram (LEXAPRO) 10 MG tablet Start at 1/2 tablet for the first week, then increase to full tablet  30 tablet  1  . folic acid (FOLVITE) 400 MCG tablet Take 400 mcg by mouth daily.        . Iron-Vitamins (GERITOL PO) Take 1 tablet by  mouth.        . Magnesium 500 MG TABS Take 1 tablet by mouth daily.        . nitrofurantoin, macrocrystal-monohydrate, (MACROBID) 100 MG capsule Take 1 capsule (100 mg total) by mouth 2 (two) times daily.  10 capsule  0  . Probiotic Product (RESTORA) CAPS Take 1 capsule by mouth daily.        . vitamin D, CHOLECALCIFEROL, 400 UNITS tablet Take 400 Units by mouth daily.      . vitamin E 100 UNIT capsule Take 100 Units by mouth daily.        Marland Kitchen zinc gluconate 50 MG tablet Take 50 mg by mouth daily.         No current facility-administered medications on file prior to visit.   Allergies  Allergen Reactions  . Amoxicillin-Pot Clavulanate Diarrhea   ROS:  Denies fevers, dizziness, chest pain, palpitations, dysuria, abdominal pain.  +nasal congestion, +nausea and headache per HPI.  Denies other neuro symptoms except as per HPI.  Denies skin rash/lesions, bleeding/bruising.  +stress/depression  PHYSICAL EXAM: BP 110/90  Pulse 72  Ht 5' 5.25" (1.657 m)  Wt 151 lb (68.493 kg)  BMI 24.95 kg/m2  Well developed female, in no distress HEENT:  PERRL, EOMI, conjunctiva clear.  Fundi benign.  Nasal mucosa mildly edematous with clear discharge.  Sinuses nontender.  OP clear.  +TTP at R temporalis muscle, and R occiput at origin of trapezius, paraspinous muscles.  nontender at temporal artery, TMJ Neck: no lymphadenopathy, thyromegaly or mass Heart: regular rate and rhythm Lung:clear bilaterally Abdomen: soft, nontender Back: no CVA tenderness Neuro: alert and oriented.  Cranial nerves intact. Normal strength, sensation, finger to nose, gait.  Normal tandem gait.  DTR's symmetric. Psych: mildly depressed mood.  Full range of affect.  Normal eye contact, speech, grooming and hygiene  ASSESSMENT/PLAN:  Tension headache  Anxiety state, unspecified  Depressive disorder, not elsewhere classified  UTI (urinary tract infection)  Tension headaches related to teeth clenching/grinding, which triggered  migraine yesterday.  Previously had nightguard--can follow up with dentist to get another.   Use aleve up to 2 tablets twice daily with food, taken regularly until discomfort at R temple completely resolves (for up to a week) Stress reduction techniques reviewed.  Depression/anxiety--didn't tolerate lexapro due to nausea--to try 1/4 tablet, change time of taking to see if can make it tolerable. Call and schedule with Berniece Andreas  UTI--urgency improving with macrobid--complete full course.  Return if symptoms persist/worsen despite above treatment recommendations.

## 2013-03-10 NOTE — Patient Instructions (Addendum)
Tension headaches related to teeth clenching/grinding, which triggered migraine yesterday.  Also tension headache at neck   Use aleve up to 2 tablets twice daily with food, taken regularly until discomfort at R temple completely resolves (for up to a week) Stress reduction techniques, frequent neck stretches throughout the day   lexapro--try 1/4 tablet, and increase to 1/2 tablet after a few days to a week if tolerating (nausea improves) Call and schedule with Berniece Andreas for counseling

## 2013-04-07 ENCOUNTER — Ambulatory Visit (INDEPENDENT_AMBULATORY_CARE_PROVIDER_SITE_OTHER): Payer: 59 | Admitting: Family Medicine

## 2013-04-07 ENCOUNTER — Encounter: Payer: Self-pay | Admitting: Family Medicine

## 2013-04-07 VITALS — BP 104/70 | HR 72 | Ht 65.25 in | Wt 152.0 lb

## 2013-04-07 DIAGNOSIS — K219 Gastro-esophageal reflux disease without esophagitis: Secondary | ICD-10-CM

## 2013-04-07 DIAGNOSIS — G4700 Insomnia, unspecified: Secondary | ICD-10-CM

## 2013-04-07 DIAGNOSIS — F3289 Other specified depressive episodes: Secondary | ICD-10-CM

## 2013-04-07 DIAGNOSIS — E7800 Pure hypercholesterolemia, unspecified: Secondary | ICD-10-CM

## 2013-04-07 DIAGNOSIS — F329 Major depressive disorder, single episode, unspecified: Secondary | ICD-10-CM

## 2013-04-07 DIAGNOSIS — F411 Generalized anxiety disorder: Secondary | ICD-10-CM

## 2013-04-07 MED ORDER — DEXLANSOPRAZOLE 60 MG PO CPDR: 60.0000 mg | DELAYED_RELEASE_CAPSULE | Freq: Every day | ORAL | Status: DC

## 2013-04-07 MED ORDER — CYPROHEPTADINE HCL 4 MG PO TABS: ORAL_TABLET | ORAL | Status: DC

## 2013-04-07 NOTE — Patient Instructions (Signed)
Start counseling as planned.  Consider trying to increase Lexapro to full tablet, if tolerated.  If not tolerated, continue with 1/2 tablet.  Continue exercise. Have pharmacy call for additional refills when needed.  Continue proper diet for reflux (avoid spicy, caffeine, citrus foods, etc).  You may continue claritin.  You may add decongestant as needed for sinus headaches, runny nose.  If your mucus is thick, try mucinex or robitussin.  Sinus rinses may also help.

## 2013-04-07 NOTE — Progress Notes (Signed)
Chief Complaint  Patient presents with  . Depression    1 month follow up. Needs refill on Dexilant.   Woke up with headache today, she thinks related to allergies.  Took Claritin this morning which helped some.  Headache is at R temple and R cheek. No longer having as many tension headaches as at last visit.  She saw dentist on Monday, and getting another bite guard at the end of the month.  She has been taking 1/2 tablet of Lexapro every night, and overall is improved.  Has some bad days related to when her mother has bad days.  Less anxious, tense, but still notes she is clenching her jaw.  Admits to only taking it 4-5 days/week--she will forget to take it at night when gets home very late. Denies problems sleeping. She started going back to the gym, getting there once a week now and plans to try and add Saturdays in to make it twice a week. She is running there.  She feels much better when she is able to exercise. Has appt with Berniece Andreas tomorrow for her first appointment.  GERD--Dexilant is working well.  Has some reflux symptoms only after certain foods, such as salsa.  Denies dysphagia.  She is concerned about her cholesterol.  Was elevated at last check, and she was given info.  No follow-up is arranged. Lab Results  Component Value Date   CHOL 241* 03/03/2013   HDL 61 03/03/2013   LDLCALC 829* 03/03/2013   TRIG 141 03/03/2013   CHOLHDL 4.0 03/03/2013    Past Medical History  Diagnosis Date  . GERD (gastroesophageal reflux disease)     EGD at [facility]  . Anxiety state, unspecified     on daily xanax for a couple of years in past  . Fibroid uterus     GYN--Dr. Seymour Bars  . Ovarian cyst   . Sickle cell trait   . Hearing loss in left ear 2008    s/p surgery 12/2012, has persistent hearing loss   Past Surgical History  Procedure Laterality Date  . Myomectomy      Dr. Seymour Bars  . Endometrial ablation    . Stapedes surgery Left 01/26/13    at [facility]; otosclerosis; persistent L hearing loss  after surgery   History   Social History  . Marital Status: Married    Spouse Name: N/A    Number of Children: 1  . Years of Education: N/A   Occupational History  . works from home.  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 Smoking status: Never Smoker   . Smokeless tobacco: Never Used  . Alcohol Use: Yes     Comment: 2-3 drinks per week.  . Drug Use: No  . Sexually Active: Yes -- Female partner(s)    Birth Control/ Protection: Post-menopausal   Other Topics Concern  . Not on file   Social History Narrative   Got Ph.D. In 06/2011.  Lives with her husband, mother and daughter (in college).   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   Current Outpatient Prescriptions on File Prior to Visit  Medication Sig Dispense Refill  . ASA-APAP-Caff Buffered (VANQUISH PO) Take 2 tablets by mouth daily.      . Calcium Carbonate-Vit D-Min (CALCIUM 1200 PO) Take 1 each by mouth daily.      . cyanocobalamin 100 MCG tablet Take 100 mcg by mouth daily.        Marland Kitchen  escitalopram (LEXAPRO) 10 MG tablet Start at 1/2 tablet for the first week, then increase to full tablet  30 tablet  1  . folic acid (FOLVITE) 400 MCG tablet Take 400 mcg by mouth daily.        . Iron-Vitamins (GERITOL PO) Take 1 tablet by mouth.        . Magnesium 500 MG TABS Take 1 tablet by mouth daily.        . Probiotic Product (RESTORA) CAPS Take 1 capsule by mouth daily.        . vitamin D, CHOLECALCIFEROL, 400 UNITS tablet Take 400 Units by mouth daily.      . vitamin E 100 UNIT capsule Take 100 Units by mouth daily.        Marland Kitchen zinc gluconate 50 MG tablet Take 50 mg by mouth daily.        Marland Kitchen ALPRAZolam (XANAX) 0.25 MG tablet Take 1 tablet (0.25 mg total) by mouth every 8 (eight) hours as needed for sleep.  20 tablet  0   No current facility-administered medications on file prior to visit.   Allergies  Allergen Reactions  .  Amoxicillin-Pot Clavulanate Diarrhea   ROS: Denies fevers, discolored nasal mucus, cough, shortness of breath, chest pain, palpitations.  +headaches, nasal congestion/allergies.  Had some chest pain a few weeks ago, diagnosed with costochondritis in past, and bayer aspirin helped.  No nausea, vomiting, bowel changes, skin rashes.  PHYSICAL EXAM: BP 104/70  Pulse 72  Ht 5' 5.25" (1.657 m)  Wt 152 lb (68.947 kg)  BMI 25.11 kg/m2  Well developed, pleasant female in no distress HEENT:  PERRL, EOMI, conjunctiva clear.  Sinuses nontender.  No tenderness at temporalis muscles.  OP clear. Nasal mucosa mildly edematous, white discharge, no purulence Neck: no lymphadenopathy, thyromegaly or mass Heart: regular rate and rhythm without murmur Lungs: clear bilaterally Abdomen: soft, nontender, no mass Extremities: no edema Psych: normal mood, affect, hygiene, grooming, speech, eye contact Neuro: alert and oriented.  Normal gait, cranial nerves   ASSESSMENT/PLAN:  Depressive disorder, not elsewhere classified  GERD  GERD (gastroesophageal reflux disease) - Plan: dexlansoprazole (DEXILANT) 60 MG capsule  Insomnia - Plan: cyproheptadine (PERIACTIN) 4 MG tablet  Anxiety state, unspecified   Start counseling as planned.  Consider trying to increase Lexapro to full tablet, if tolerated.  If not tolerated, continue with 1/2 tablet.  Continue exercise. Have pharmacy call for additional refills (6 months) when needed.  Allergies--OTC measures reviewed.  Headaches--tension and sinus.  Discussed supportive measures (sinus rinse, mucinex, antihistamines).  Get nightguard as planned.  GERD--stable overall.  Continue to try and follow reflux precautions/diet.     F/u 6 months med check with fasting labs prior  Discuss lipids at f/u

## 2013-04-08 ENCOUNTER — Ambulatory Visit (INDEPENDENT_AMBULATORY_CARE_PROVIDER_SITE_OTHER): Payer: 59 | Admitting: Licensed Clinical Social Worker

## 2013-04-08 DIAGNOSIS — F321 Major depressive disorder, single episode, moderate: Secondary | ICD-10-CM

## 2013-04-22 ENCOUNTER — Encounter: Payer: Self-pay | Admitting: Gastroenterology

## 2013-05-06 ENCOUNTER — Ambulatory Visit (INDEPENDENT_AMBULATORY_CARE_PROVIDER_SITE_OTHER): Payer: 59 | Admitting: Licensed Clinical Social Worker

## 2013-05-06 DIAGNOSIS — F321 Major depressive disorder, single episode, moderate: Secondary | ICD-10-CM

## 2013-05-17 ENCOUNTER — Telehealth: Payer: Self-pay | Admitting: Internal Medicine

## 2013-05-17 NOTE — Telephone Encounter (Signed)
Pt scheduled 5/22 to see Dr. Lynelle Doctor

## 2013-05-17 NOTE — Telephone Encounter (Signed)
I would like her to schedule OV for tomorrow for evaluation (reasoning is below).    (her last culture showed enterococcus, and there was some resistance of the bacteria to certain antibiotics.  I would like to see her (to ensure no evidence of kidney infection) and that way we could also collect a urine specimen to properly evaluate her infection.  This way, if she doesn't respond, we will know the type of bacterial and which antibiotic to switch it to.  Please schedule appt.)

## 2013-05-17 NOTE — Telephone Encounter (Signed)
Pt is complaining of another UTI with burning and lower back pain. And has been drinking cranberry juice and staying hydrated but she feels she needs an another antbotic. She was seen back in march for UTI. She would like rx sent to [facility] [address]

## 2013-05-19 ENCOUNTER — Ambulatory Visit (INDEPENDENT_AMBULATORY_CARE_PROVIDER_SITE_OTHER): Payer: 59 | Admitting: Family Medicine

## 2013-05-19 ENCOUNTER — Encounter: Payer: Self-pay | Admitting: Family Medicine

## 2013-05-19 VITALS — BP 94/60 | HR 72 | Temp 98.1°F | Ht 65.25 in | Wt 155.0 lb

## 2013-05-19 DIAGNOSIS — R300 Dysuria: Secondary | ICD-10-CM

## 2013-05-19 LAB — POCT URINALYSIS DIPSTICK
Protein, UA: NEGATIVE
Spec Grav, UA: 1.015
Urobilinogen, UA: NEGATIVE

## 2013-05-19 NOTE — Patient Instructions (Addendum)
Continue drinking plenty of fluids. Consider discussing with GYN if ongoing issues and no evidence of infection, might be related to atrophic changes to urethral area.   We will call with results when available.

## 2013-05-19 NOTE — Progress Notes (Signed)
Chief Complaint  Patient presents with  . Back Pain    some burning with urination, lower abdominal pain and urgency x 1 month.   3-4 weeks ago she started with lower abdominal pain and urgency.  She hadn't been drinking enough water.  In the last week she developed slight burning with urination, and some low back pain this week.  The urinary urgency has improved some.  Just feels "yucky".  She no longer has the abdominal pain (sporadic, less often).  Only had burning with urination two times, earlier this week, not recently.  She has been drinking more water, and always drinks cranberry juice daily.  She has been doing a lot of lifting, bending, wearing high heels (new shoes that she wore this week).  "I know how I feel with a urinary tract infection", and thinks she has one.  Denies fevers, chills, nausea, vomiting.  Denies flank pain.  She had some vaginal discharge early on, brownish (denies that it was vaginal spotting or bleeding).  Vaginal discharge has resolved.  Denies pelvic pain, dyspareunia.  Recent UTI in March showed enterococcus (see culture results).  Past Medical History  Diagnosis Date  . GERD (gastroesophageal reflux disease)     EGD at [facility]  . Anxiety state, unspecified     on daily xanax for a couple of years in past  . Fibroid uterus     GYN--Dr. Seymour Bars  . Ovarian cyst   . Sickle cell trait   . Hearing loss in left ear 2008    s/p surgery 12/2012, has persistent hearing loss   Past Surgical History  Procedure Laterality Date  . Myomectomy      Dr. Seymour Bars  . Endometrial ablation    . 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 Marital Status: Married    Spouse Name: N/A    Number of Children: 1  . Years of Education: N/A   Occupational History  . works from home.  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 Smoking status: Never  Smoker   . Smokeless tobacco: Never Used  . Alcohol Use: Yes     Comment: 2-3 drinks per week.  . Drug Use: No  . Sexually Active: Yes -- Female partner(s)    Birth Control/ Protection: Post-menopausal   Other Topics Concern  . Not on file   Social History Narrative   Got Ph.D. In 06/2011.  Lives with her husband, mother and daughter (in college).   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   Current Outpatient Prescriptions on File Prior to Visit  Medication Sig Dispense Refill  . ASA-APAP-Caff Buffered (VANQUISH PO) Take 2 tablets by mouth daily.      . Calcium Carbonate-Vit D-Min (CALCIUM 1200 PO) Take 1 each by mouth daily.      . cyanocobalamin 100 MCG tablet Take 100 mcg by mouth daily.        . cyproheptadine (PERIACTIN) 4 MG tablet TAKE ONE TABLET BY MOUTH AT BEDTIME AS NEEDED  30 tablet  5  . dexlansoprazole (DEXILANT) 60 MG capsule Take 1 capsule (60 mg total) by mouth daily.  30 capsule  5  . escitalopram (LEXAPRO) 10 MG tablet Start at 1/2 tablet for the first week, then increase to full tablet  30 tablet  1  .  folic acid (FOLVITE) 400 MCG tablet Take 400 mcg by mouth daily.        . Iron-Vitamins (GERITOL PO) Take 1 tablet by mouth.        . Magnesium 500 MG TABS Take 1 tablet by mouth daily.        . Probiotic Product (RESTORA) CAPS Take 1 capsule by mouth daily.        . vitamin D, CHOLECALCIFEROL, 400 UNITS tablet Take 400 Units by mouth daily.      . vitamin E 100 UNIT capsule Take 100 Units by mouth daily.        Marland Kitchen zinc gluconate 50 MG tablet Take 50 mg by mouth daily.        Marland Kitchen ALPRAZolam (XANAX) 0.25 MG tablet Take 1 tablet (0.25 mg total) by mouth every 8 (eight) hours as needed for sleep.  20 tablet  0   No current facility-administered medications on file prior to visit.   Allergies  Allergen Reactions  . Amoxicillin-Pot Clavulanate Diarrhea   ROS:  See HPI.  No fevers, nausea, vomiting, flank pain,  diarrhea, skin rash, chest pain, shortness of breath.  PHYSICAL EXAM: BP 94/60  Pulse 72  Temp(Src) 98.1 F (36.7 C) (Oral)  Ht 5' 5.25" (1.657 m)  Wt 155 lb (70.308 kg)  BMI 25.61 kg/m2 Well developed female, in no distress  No CVA tenderness.  Area of discomfort is lower lumbar area, but nontender on exam, no spasm. Abdomen: no suprapubic tenderness  Urine dip:  Trace leuks, trace blood  ASSESSMENT/PLAN:  Burning with urination - Plan: POCT Urinalysis Dipstick, Urine culture  Declines presumptive ABX while awaiting culture.  Can try AZO prn discomfort. Will call with culture results when availble.    Continue drinking plenty of fluids. Consider discussing with GYN if ongoing issues and no evidence of infection, might be related to atrophic changes to urethral area (she reports some dryness)

## 2013-05-20 ENCOUNTER — Encounter: Payer: Self-pay | Admitting: Gastroenterology

## 2013-05-20 ENCOUNTER — Ambulatory Visit (AMBULATORY_SURGERY_CENTER): Payer: 59 | Admitting: *Deleted

## 2013-05-20 ENCOUNTER — Ambulatory Visit: Payer: 59 | Admitting: Licensed Clinical Social Worker

## 2013-05-20 VITALS — Ht 66.0 in | Wt 154.0 lb

## 2013-05-20 DIAGNOSIS — Z1211 Encounter for screening for malignant neoplasm of colon: Secondary | ICD-10-CM

## 2013-05-20 MED ORDER — NA SULFATE-K SULFATE-MG SULF 17.5-3.13-1.6 GM/177ML PO SOLN: ORAL | Status: DC

## 2013-05-21 LAB — URINE CULTURE

## 2013-05-27 ENCOUNTER — Telehealth: Payer: Self-pay

## 2013-05-27 NOTE — Telephone Encounter (Signed)
Patient rescheduled to the same day at 3:30

## 2013-05-27 NOTE — Telephone Encounter (Signed)
I have left a message for the patient that we need to reschedule her colonoscopy for 05/31/13, Dr. Russella Dar has a conflict.

## 2013-05-31 ENCOUNTER — Ambulatory Visit (AMBULATORY_SURGERY_CENTER): Payer: 59 | Admitting: Gastroenterology

## 2013-05-31 ENCOUNTER — Encounter: Payer: Self-pay | Admitting: Gastroenterology

## 2013-05-31 ENCOUNTER — Encounter: Payer: 59 | Admitting: Gastroenterology

## 2013-05-31 VITALS — BP 98/62 | HR 64 | Temp 97.1°F | Resp 16 | Ht 66.0 in | Wt 154.0 lb

## 2013-05-31 DIAGNOSIS — D126 Benign neoplasm of colon, unspecified: Secondary | ICD-10-CM

## 2013-05-31 DIAGNOSIS — Z1211 Encounter for screening for malignant neoplasm of colon: Secondary | ICD-10-CM

## 2013-05-31 MED ORDER — SODIUM CHLORIDE 0.9 % IV SOLN: 500.0000 mL | INTRAVENOUS | Status: DC

## 2013-05-31 NOTE — Patient Instructions (Addendum)

## 2013-05-31 NOTE — Progress Notes (Signed)
Patient did not experience any of the following events: a burn prior to discharge; a fall within the facility; wrong site/side/patient/procedure/implant event; or a hospital transfer or hospital admission upon discharge from the facility. (G8907) Patient did not have preoperative order for IV antibiotic SSI prophylaxis. (G8918)  

## 2013-05-31 NOTE — Progress Notes (Signed)
Called to room to assist during endoscopic procedure.  Patient ID and intended procedure confirmed with present staff. Received instructions for my participation in the procedure from the performing physician.  

## 2013-05-31 NOTE — Op Note (Signed)
[facility] [address] [place], [address]   COLONOSCOPY PROCEDURE REPORT  PATIENT: Laura Kirby, Laura Kirby  MR#: [id number] BIRTHDATE: [birth year] , 56  yrs. old GENDER: Female ENDOSCOPIST: Meryl Dare, MD, [facility] REFERRED Edison Simon, M.D. PROCEDURE DATE:  05/31/2013 PROCEDURE:   Colonoscopy with snare polypectomy ASA CLASS:   Class II INDICATIONS:average risk screening and first colonoscopy. MEDICATIONS: MAC sedation, administered by CRNA and propofol (Diprivan) 200mg  IV DESCRIPTION OF PROCEDURE:   After the risks benefits and alternatives of the procedure were thoroughly explained, informed consent was obtained.  A digital rectal exam revealed no abnormalities of the rectum.   The LB [id number]  endoscope was introduced through the anus and advanced to the cecum, which was identified by both the appendix and ileocecal valve. No adverse events experienced.   The quality of the prep was good, using MoviPrep  The instrument was then slowly withdrawn as the colon was fully examined.  COLON FINDINGS: Two sessile polyps measuring 5-6 mm in size were found at the cecum.  A polypectomy was performed with a cold snare. The resection was complete and the polyp tissue was completely retrieved.   Mild diverticulosis was noted.   The colon was otherwise normal.  There was no diverticulosis, inflammation, polyps or cancers unless previously stated.  Retroflexed views revealed no abnormalities. The time to cecum=2 minutes 51 seconds. Withdrawal time=9 minutes 00 seconds.  The scope was withdrawn and the procedure completed.  COMPLICATIONS: There were no complications.  ENDOSCOPIC IMPRESSION: 1.   Two sessile polyps measuring 5-6 mm at the cecum; polypectomy performed with a cold snare 2.   Mild diverticulosis was noted  RECOMMENDATIONS: 1.  Await pathology results 2.  Repeat colonoscopy in 5 years if polyp(s) adenomatous; otherwise 10 years 3.  High  fiber diet with liberal fluid intake.  eSigned:  Meryl Dare, MD, [facility] 05/31/2013 2:50 PM

## 2013-06-01 ENCOUNTER — Telehealth: Payer: Self-pay

## 2013-06-01 NOTE — Telephone Encounter (Signed)
Left message on answering machine. 

## 2013-06-12 ENCOUNTER — Encounter: Payer: Self-pay | Admitting: Gastroenterology

## 2013-06-14 ENCOUNTER — Encounter: Payer: Self-pay | Admitting: Internal Medicine

## 2013-06-20 ENCOUNTER — Telehealth: Payer: Self-pay | Admitting: Gastroenterology

## 2013-06-20 NOTE — Telephone Encounter (Signed)
Patient's questions about polyps and pathology answered.  I have mailed her some literature on Polyps, she will call back if she has any additional questions

## 2013-07-05 ENCOUNTER — Other Ambulatory Visit: Payer: Self-pay | Admitting: Family Medicine

## 2013-08-24 ENCOUNTER — Other Ambulatory Visit: Payer: Self-pay | Admitting: Obstetrics & Gynecology

## 2013-08-24 ENCOUNTER — Encounter (HOSPITAL_COMMUNITY): Payer: Self-pay | Admitting: Obstetrics & Gynecology

## 2013-08-26 ENCOUNTER — Encounter (HOSPITAL_COMMUNITY): Payer: Self-pay | Admitting: Pharmacy Technician

## 2013-09-02 ENCOUNTER — Encounter (HOSPITAL_COMMUNITY)
Admission: RE | Disposition: A | Discharge status: Home / Self Care | Payer: Self-pay | Source: Ambulatory Visit | Attending: Obstetrics & Gynecology

## 2013-09-02 ENCOUNTER — Ambulatory Visit (HOSPITAL_COMMUNITY)
Admission: RE | Admit: 2013-09-02 | Discharge: 2013-09-02 | Disposition: A | Discharge status: Home / Self Care | Payer: 59 | Source: Ambulatory Visit | Attending: Obstetrics & Gynecology | Admitting: Obstetrics & Gynecology

## 2013-09-02 ENCOUNTER — Ambulatory Visit (HOSPITAL_COMMUNITY): Payer: 59 | Admitting: Registered Nurse

## 2013-09-02 ENCOUNTER — Encounter (HOSPITAL_COMMUNITY): Payer: Self-pay | Admitting: Registered Nurse

## 2013-09-02 ENCOUNTER — Encounter (HOSPITAL_COMMUNITY): Payer: Self-pay | Admitting: *Deleted

## 2013-09-02 DIAGNOSIS — N854 Malposition of uterus: Secondary | ICD-10-CM | POA: Insufficient documentation

## 2013-09-02 DIAGNOSIS — N841 Polyp of cervix uteri: Secondary | ICD-10-CM | POA: Insufficient documentation

## 2013-09-02 DIAGNOSIS — N840 Polyp of corpus uteri: Secondary | ICD-10-CM | POA: Insufficient documentation

## 2013-09-02 DIAGNOSIS — N950 Postmenopausal bleeding: Secondary | ICD-10-CM | POA: Insufficient documentation

## 2013-09-02 HISTORY — DX: Major depressive disorder, single episode, unspecified: F32.9

## 2013-09-02 HISTORY — DX: Hyperlipidemia, unspecified: E78.5

## 2013-09-02 HISTORY — DX: Unspecified osteoarthritis, unspecified site: M19.90

## 2013-09-02 HISTORY — DX: Depression, unspecified: F32.A

## 2013-09-02 HISTORY — DX: Anemia, unspecified: D64.9

## 2013-09-02 HISTORY — PX: DILITATION & CURRETTAGE/HYSTROSCOPY WITH VERSAPOINT RESECTION: SHX5571

## 2013-09-02 LAB — CBC
HCT: 40.9 % (ref 36.0–46.0)
Hemoglobin: 14.6 g/dL (ref 12.0–15.0)
MCH: 30 pg (ref 26.0–34.0)
MCHC: 35.7 g/dL (ref 30.0–36.0)
MCV: 84 fL (ref 78.0–100.0)
RDW: 14 % (ref 11.5–15.5)

## 2013-09-02 SURGERY — DILATATION & CURETTAGE/HYSTEROSCOPY WITH VERSAPOINT RESECTION
Anesthesia: General | Site: Vagina | Wound class: Clean Contaminated

## 2013-09-02 MED ORDER — LIDOCAINE HCL (CARDIAC) 20 MG/ML IV SOLN
INTRAVENOUS | Status: AC
  Filled 2013-09-02: qty 5

## 2013-09-02 MED ORDER — LIDOCAINE HCL (CARDIAC) 20 MG/ML IV SOLN
INTRAVENOUS | Status: DC | PRN
  Administered 2013-09-02: 50 mg via INTRAVENOUS

## 2013-09-02 MED ORDER — FAMOTIDINE 20 MG PO TABS
ORAL_TABLET | ORAL | Status: AC
  Administered 2013-09-02: 20 mg via ORAL
  Filled 2013-09-02: qty 1

## 2013-09-02 MED ORDER — CEFAZOLIN SODIUM-DEXTROSE 2-3 GM-% IV SOLR
2.0000 g | INTRAVENOUS | Status: AC
  Administered 2013-09-02: 2 g via INTRAVENOUS

## 2013-09-02 MED ORDER — MIDAZOLAM HCL 5 MG/5ML IJ SOLN
INTRAMUSCULAR | Status: DC | PRN
  Administered 2013-09-02: 2 mg via INTRAVENOUS

## 2013-09-02 MED ORDER — MIDAZOLAM HCL 2 MG/2ML IJ SOLN
INTRAMUSCULAR | Status: AC
  Filled 2013-09-02: qty 2

## 2013-09-02 MED ORDER — DEXAMETHASONE SODIUM PHOSPHATE 10 MG/ML IJ SOLN
INTRAMUSCULAR | Status: AC
  Filled 2013-09-02: qty 1

## 2013-09-02 MED ORDER — FAMOTIDINE 20 MG PO TABS
ORAL_TABLET | ORAL | Status: AC
  Filled 2013-09-02: qty 1

## 2013-09-02 MED ORDER — ONDANSETRON HCL 4 MG/2ML IJ SOLN
INTRAMUSCULAR | Status: DC | PRN
  Administered 2013-09-02: 4 mg via INTRAVENOUS

## 2013-09-02 MED ORDER — SODIUM CHLORIDE 0.9 % IR SOLN
Status: DC | PRN
  Administered 2013-09-02: 3000 mL

## 2013-09-02 MED ORDER — LACTATED RINGERS IV SOLN
INTRAVENOUS | Status: DC
  Administered 2013-09-02 (×2): via INTRAVENOUS

## 2013-09-02 MED ORDER — HYDROCODONE-IBUPROFEN 5-200 MG PO TABS: 1.0000 | ORAL_TABLET | Freq: Three times a day (TID) | ORAL | Status: DC | PRN

## 2013-09-02 MED ORDER — PROPOFOL 10 MG/ML IV BOLUS
INTRAVENOUS | Status: DC | PRN
  Administered 2013-09-02: 170 mg via INTRAVENOUS

## 2013-09-02 MED ORDER — FENTANYL CITRATE 0.05 MG/ML IJ SOLN: 25.0000 ug | INTRAMUSCULAR | Status: DC | PRN

## 2013-09-02 MED ORDER — KETOROLAC TROMETHAMINE 30 MG/ML IJ SOLN
INTRAMUSCULAR | Status: AC
  Filled 2013-09-02: qty 1

## 2013-09-02 MED ORDER — CEFAZOLIN SODIUM-DEXTROSE 2-3 GM-% IV SOLR
INTRAVENOUS | Status: AC
  Filled 2013-09-02: qty 50

## 2013-09-02 MED ORDER — ONDANSETRON HCL 4 MG/2ML IJ SOLN
INTRAMUSCULAR | Status: AC
  Filled 2013-09-02: qty 2

## 2013-09-02 MED ORDER — DEXAMETHASONE SODIUM PHOSPHATE 10 MG/ML IJ SOLN
INTRAMUSCULAR | Status: DC | PRN
  Administered 2013-09-02: 10 mg via INTRAVENOUS

## 2013-09-02 MED ORDER — KETOROLAC TROMETHAMINE 30 MG/ML IJ SOLN
INTRAMUSCULAR | Status: DC | PRN
  Administered 2013-09-02: 30 mg via INTRAVENOUS

## 2013-09-02 MED ORDER — SILVER NITRATE-POT NITRATE 75-25 % EX MISC
CUTANEOUS | Status: AC
  Filled 2013-09-02: qty 1

## 2013-09-02 MED ORDER — CHLOROPROCAINE HCL 1 % IJ SOLN
INTRAMUSCULAR | Status: AC
  Filled 2013-09-02: qty 30

## 2013-09-02 MED ORDER — FENTANYL CITRATE 0.05 MG/ML IJ SOLN
INTRAMUSCULAR | Status: DC | PRN
  Administered 2013-09-02 (×2): 50 ug via INTRAVENOUS

## 2013-09-02 MED ORDER — FAMOTIDINE 20 MG PO TABS
20.0000 mg | ORAL_TABLET | Freq: Once | ORAL | Status: AC
  Administered 2013-09-02: 20 mg via ORAL

## 2013-09-02 MED ORDER — PROPOFOL 10 MG/ML IV EMUL
INTRAVENOUS | Status: AC
  Filled 2013-09-02: qty 20

## 2013-09-02 MED ORDER — CHLOROPROCAINE HCL 1 % IJ SOLN
INTRAMUSCULAR | Status: DC | PRN
  Administered 2013-09-02: 20 mL

## 2013-09-02 MED ORDER — FENTANYL CITRATE 0.05 MG/ML IJ SOLN
INTRAMUSCULAR | Status: AC
  Filled 2013-09-02: qty 2

## 2013-09-02 SURGICAL SUPPLY — 15 items
CANISTER SUCTION 2500CC (MISCELLANEOUS) ×2 IMPLANT
CATH ROBINSON RED A/P 16FR (CATHETERS) ×2 IMPLANT
CLOTH BEACON ORANGE TIMEOUT ST (SAFETY) ×2 IMPLANT
CONTAINER PREFILL 10% NBF 60ML (FORM) ×2 IMPLANT
DRESSING TELFA 8X3 (GAUZE/BANDAGES/DRESSINGS) ×2 IMPLANT
ELECTRODE RT ANGLE VERSAPOINT (CUTTING LOOP) ×2 IMPLANT
GLOVE BIO SURGEON STRL SZ 6.5 (GLOVE) ×2 IMPLANT
GLOVE BIOGEL PI IND STRL 7.0 (GLOVE) ×2 IMPLANT
GLOVE BIOGEL PI INDICATOR 7.0 (GLOVE) ×2
GOWN STRL REIN XL XLG (GOWN DISPOSABLE) ×2 IMPLANT
GOWN SURGICAL LARGE (GOWNS) ×2 IMPLANT
PACK HYSTEROSCOPY LF (CUSTOM PROCEDURE TRAY) ×2 IMPLANT
PAD OB MATERNITY 4.3X12.25 (PERSONAL CARE ITEMS) ×2 IMPLANT
TOWEL OR 17X24 6PK STRL BLUE (TOWEL DISPOSABLE) ×4 IMPLANT
WATER STERILE IRR 1000ML POUR (IV SOLUTION) ×2 IMPLANT

## 2013-09-02 NOTE — Op Note (Signed)
09/02/2013  9:53 AM  PATIENT:  Laura Kirby  57 y.o. female  PRE-OPERATIVE DIAGNOSIS:  Endometrial Polyp/s/p endometrial ablation    POST-OPERATIVE DIAGNOSIS:  Endometrial Polyp/s/p endometrial ablation/minor synechia  PROCEDURE:  Procedure(s): DILATATION & CURETTAGE/HYSTEROSCOPY WITH VERSAPOINT RESECTION  SURGEON:  Surgeon(s): Genia Del, MD  ASSISTANTS: none   ANESTHESIA:   general  PROCEDURE:  Under general anesthesia with laryngeal mask, the patient is in lithotomy position. She is prepped with Betadine on the suprapubic, vulvar and vaginal areas. She is draped as usual. The vaginal exam revealed an anteverted uterus, normal volume, no adnexal mass.  The speculum is inserted in the vagina. The anterior lip of the cervix is grasped with a tenaculum. A paracervical block is done with Nesacaine 1% a total of 20 cc at 4 and 8:00. We dilate the cervix with Hegar dilators up to #33 without difficulty.  Insertion of the operative hysteroscope in the intrauterine cavity.  A probable polyp is seen at the level of the endocervix.  The endometrial cavity is within normal limit to inspection with the right ostium well seen and the left ostium location identified but the opening appears closed by adhesions.  Synechiae are present in the endocervical canal.  The synechiae and the endocervical polyp are resected with the VersaPoint loop.  Hemostasis is adequate at all levels. The hysteroscope was removed. We then used a sharp curet to systematically curettage the intrauterine cavity on all surfaces. Both specimens are sent together to pathology.  The tenaculum was removed from the cervix. Hemostasis was completed at that level with silver nitrate sticks. The speculum is removed. The patient is brought to recovery room in good and stable status. Pictures were taken.  ESTIMATED BLOOD LOSS:  50 cc FLUID DEFICIT:  200 cc   Intake/Output Summary (Last 24 hours) at 09/02/13 0953 Last data  filed at 09/02/13 0945  Gross per 24 hour  Intake   1200 ml  Output    150 ml  Net   1050 ml     BLOOD ADMINISTERED:none   LOCAL MEDICATIONS USED:  Nesacaine, Amount: 20 ml  SPECIMEN:  Source of Specimen:  Endometrial resection and curettings  DISPOSITION OF SPECIMEN:  PATHOLOGY  COUNTS:  YES  PLAN OF CARE: Transfer to PACU  Genia Del  MD  09/02/2013  9:54 am

## 2013-09-02 NOTE — Discharge Summary (Signed)
  Physician Discharge Summary  Patient ID: Laura Kirby MRN: [id number] DOB/AGE: [birth year] 57 y.o.  Admit date: 09/02/2013 Discharge date: 09/02/2013  Admission Diagnoses: Endometrial Polyp  [id number], s/p endometrial ablation  Discharge Diagnoses: Endometrial Polyp  [phone], s/p endometrial ablation, mild synechiae        Active Problems:   * No active hospital problems. *   Discharged Condition: good  Hospital Course: Outpatient  Consults: None  Treatments: surgery: Hysteroscopy with Versapoint resection, D+C  Disposition: 01-Home or Self Care   Future Appointments Provider Department Dept Phone   10/10/2013 8:30 AM [facility] Nurse [facility] [phone]   10/13/2013 9:30 AM Joselyn Arrow, MD [facility] [phone]       Medication List         ALPRAZolam 0.25 MG tablet  Commonly known as:  XANAX  Take 1 tablet (0.25 mg total) by mouth every 8 (eight) hours as needed for sleep.     CALCIUM 1200 PO  Take 1 each by mouth daily.     cyanocobalamin 100 MCG tablet  Take 100 mcg by mouth daily.     cyproheptadine 4 MG tablet  Commonly known as:  PERIACTIN  Take 4 mg by mouth at bedtime as needed (for sleep.).     dexlansoprazole 60 MG capsule  Commonly known as:  DEXILANT  Take 60 mg by mouth daily.     escitalopram 10 MG tablet  Commonly known as:  LEXAPRO  Take 10 mg by mouth daily.     folic acid 400 MCG tablet  Commonly known as:  FOLVITE  Take 400 mcg by mouth daily.     GERITOL PO  Take 1 tablet by mouth.     hydrocodone-ibuprofen 5-200 MG per tablet  Commonly known as:  VICOPROFEN  Take 1 tablet by mouth every 8 (eight) hours as needed for pain.     Magnesium 500 MG Tabs  Take 1 tablet by mouth daily.     RESTORA Caps  Take 1 capsule by mouth daily.     VANQUISH PO  Take 2 tablets by mouth daily.     vitamin D (CHOLECALCIFEROL) 400 UNITS tablet  Take 400 Units by mouth daily.     vitamin E 100 UNIT capsule   Take 100 Units by mouth daily.     zinc gluconate 50 MG tablet  Take 50 mg by mouth daily.           Follow-up Information   Follow up with Areen Trautner,MARIE-LYNE, MD In 3 weeks.   Specialty:  Obstetrics and Gynecology   Contact information:   [address] [place] [address] [phone]       Signed: Genia Del, MD 09/02/2013, 10:06 AM

## 2013-09-02 NOTE — Anesthesia Preprocedure Evaluation (Signed)
Anesthesia Evaluation  Patient identified by MRN, date of birth, ID band Patient awake    Reviewed: Allergy & Precautions, H&P , Patient's Chart, lab work & pertinent test results, reviewed documented beta blocker date and time   Airway Mallampati: II TM Distance: >3 FB Neck ROM: full    Dental no notable dental hx.    Pulmonary  breath sounds clear to auscultation  Pulmonary exam normal       Cardiovascular Rhythm:regular Rate:Normal     Neuro/Psych    GI/Hepatic GERD-  Medicated,  Endo/Other    Renal/GU      Musculoskeletal   Abdominal   Peds  Hematology   Anesthesia Other Findings   Reproductive/Obstetrics                           Anesthesia Physical Anesthesia Plan  ASA: II  Anesthesia Plan:    Post-op Pain Management:    Induction: Intravenous  Airway Management Planned: LMA  Additional Equipment:   Intra-op Plan:   Post-operative Plan:   Informed Consent: I have reviewed the patients History and Physical, chart, labs and discussed the procedure including the risks, benefits and alternatives for the proposed anesthesia with the patient or authorized representative who has indicated his/her understanding and acceptance.   Dental Advisory Given and Dental advisory given  Plan Discussed with: CRNA and Surgeon  Anesthesia Plan Comments:         Anesthesia Quick Evaluation

## 2013-09-02 NOTE — Anesthesia Postprocedure Evaluation (Signed)
  Anesthesia Post-op Note  Patient: Laura Kirby  Procedure(s) Performed: Procedure(s): DILATATION & CURETTAGE/HYSTEROSCOPY WITH VERSAPOINT RESECTION (N/A) Patient is awake and responsive. Pain and nausea are reasonably well controlled. Vital signs are stable and clinically acceptable. Oxygen saturation is clinically acceptable. There are no apparent anesthetic complications at this time. Patient is ready for discharge.

## 2013-09-02 NOTE — H&P (Signed)
Laura Kirby is an 57 y.o. female G0  RP:  PMB with polyp on EBx for [facility] resection/D+C  Pertinent Gynecological History: Menses: post-menopausal Bleeding: post menopausal bleeding Contraception: none Blood transfusions: none Sexually transmitted diseases: no past history Previous GYN Procedures: Endometrial ablation/ Myomectomy by laparotomy  Last mammogram: normal Last pap: normal OB History: G0  Menstrual History:  No LMP recorded. Patient is postmenopausal.    Past Medical History  Diagnosis Date  . GERD (gastroesophageal reflux disease)     EGD at [facility]  . Anxiety state, unspecified     on daily xanax for a couple of years in past  . Fibroid uterus     GYN--Dr. Seymour Bars  . Ovarian cyst   . Sickle cell trait   . Hearing loss in left ear 2008    s/p surgery 12/2012, has persistent hearing loss  . Hyperlipidemia     diet controlled  . Depression   . Arthritis     degenerative disc disease  . Anemia     history     Past Surgical History  Procedure Laterality Date  . Myomectomy      Dr. Seymour Bars - abdominal myomectomy  . Endometrial ablation    . Stapedes surgery Left 01/26/13    at [facility]; otosclerosis; persistent L hearing loss after surgery    Family History  Problem Relation Age of Onset  . Hyperlipidemia Mother   . Hypertension Mother   . Diabetes Mother   . Dementia Mother   . Irritable bowel syndrome Mother   . Alcohol abuse Father   . Kidney disease Father   . Ulcers Father   . Allergies Daughter   . Eczema Daughter   . Cancer Maternal Aunt     breast cancer (60's)  . Cancer Maternal Uncle 70    colon  . Colon cancer Maternal Uncle 70  . Diabetes Maternal Grandfather   . Stroke Maternal Grandfather     Social History:  reports that she has never smoked. She has never used smokeless tobacco. She reports that she drinks about 1.8 ounces of alcohol per week. She reports that she does not use illicit drugs.  Allergies:  Allergies   Allergen Reactions  . Amoxicillin-Pot Clavulanate Diarrhea and Nausea Only    Prescriptions prior to admission  Medication Sig Dispense Refill  . ALPRAZolam (XANAX) 0.25 MG tablet Take 1 tablet (0.25 mg total) by mouth every 8 (eight) hours as needed for sleep.  20 tablet  0  . ASA-APAP-Caff Buffered (VANQUISH PO) Take 2 tablets by mouth daily.      . Calcium Carbonate-Vit D-Min (CALCIUM 1200 PO) Take 1 each by mouth daily.      . cyanocobalamin 100 MCG tablet Take 100 mcg by mouth daily.        . cyproheptadine (PERIACTIN) 4 MG tablet Take 4 mg by mouth at bedtime as needed (for sleep.).      Marland Kitchen dexlansoprazole (DEXILANT) 60 MG capsule Take 60 mg by mouth daily.      Marland Kitchen escitalopram (LEXAPRO) 10 MG tablet Take 10 mg by mouth daily.      . folic acid (FOLVITE) 400 MCG tablet Take 400 mcg by mouth daily.        . Iron-Vitamins (GERITOL PO) Take 1 tablet by mouth.        . Magnesium 500 MG TABS Take 1 tablet by mouth daily.        . Probiotic Product (RESTORA) CAPS Take 1 capsule by  mouth daily.        . vitamin D, CHOLECALCIFEROL, 400 UNITS tablet Take 400 Units by mouth daily.      . vitamin E 100 UNIT capsule Take 100 Units by mouth daily.        Marland Kitchen zinc gluconate 50 MG tablet Take 50 mg by mouth daily.          Blood pressure 110/61, pulse 72, temperature 98.4 F (36.9 C), temperature source Oral, resp. rate 18, height 5' 5.5" (1.664 m), weight 70.308 kg (155 lb), SpO2 100.00%.  Pelvic US:  Thick irregular endometrial line. EBx;  Polyp  Results for orders placed during the hospital encounter of 09/02/13 (from the past 24 hour(s))  CBC     Status: None   Collection Time    09/02/13  8:00 AM      Result Value Range   WBC 6.3  4.0 - 10.5 K/uL   RBC 4.87  3.87 - 5.11 MIL/uL   Hemoglobin 14.6  12.0 - 15.0 g/dL   HCT 16.1  09.6 - 04.5 %   MCV 84.0  78.0 - 100.0 fL   MCH 30.0  26.0 - 34.0 pg   MCHC 35.7  30.0 - 36.0 g/dL   RDW 40.9  81.1 - 91.4 %   Platelets 214  150 - 400 K/uL     No results found.  Assessment/Plan: Probable endometrial polyp/ S/P endometrial ablation/abnormal uterine bleeding for HSC resection, D+C.  Surgery and risks reviewed.  Christien Berthelot,MARIE-LYNE 09/02/2013, 8:54 AM

## 2013-09-02 NOTE — Transfer of Care (Signed)
Immediate Anesthesia Transfer of Care Note  Patient: Laura Kirby  Procedure(s) Performed: Procedure(s): DILATATION & CURETTAGE/HYSTEROSCOPY WITH VERSAPOINT RESECTION (N/A)  Patient Location: PACU  Anesthesia Type:General  Level of Consciousness: awake, alert  and oriented  Airway & Oxygen Therapy: Patient Spontanous Breathing and Patient connected to nasal cannula oxygen  Post-op Assessment: Report given to PACU RN  Post vital signs: Reviewed  Complications: No apparent anesthesia complications

## 2013-09-02 NOTE — Preoperative (Signed)
Beta Blockers   Reason not to administer Beta Blockers:Not Applicable 

## 2013-09-05 ENCOUNTER — Encounter (HOSPITAL_COMMUNITY): Payer: Self-pay | Admitting: Obstetrics & Gynecology

## 2013-10-10 ENCOUNTER — Other Ambulatory Visit: Payer: 59

## 2013-10-13 ENCOUNTER — Encounter: Payer: 59 | Admitting: Family Medicine

## 2013-10-31 ENCOUNTER — Other Ambulatory Visit: Payer: 59

## 2013-10-31 DIAGNOSIS — E7800 Pure hypercholesterolemia, unspecified: Secondary | ICD-10-CM

## 2013-10-31 LAB — LIPID PANEL
Cholesterol: 231 mg/dL — ABNORMAL HIGH (ref 0–200)
Triglycerides: 75 mg/dL (ref <150)

## 2013-11-03 ENCOUNTER — Ambulatory Visit (INDEPENDENT_AMBULATORY_CARE_PROVIDER_SITE_OTHER): Payer: 59 | Admitting: Family Medicine

## 2013-11-03 ENCOUNTER — Encounter: Payer: Self-pay | Admitting: Family Medicine

## 2013-11-03 ENCOUNTER — Other Ambulatory Visit: Payer: Self-pay

## 2013-11-03 VITALS — BP 126/80 | HR 80 | Ht 65.25 in | Wt 162.0 lb

## 2013-11-03 DIAGNOSIS — K219 Gastro-esophageal reflux disease without esophagitis: Secondary | ICD-10-CM

## 2013-11-03 DIAGNOSIS — F329 Major depressive disorder, single episode, unspecified: Secondary | ICD-10-CM

## 2013-11-03 DIAGNOSIS — F411 Generalized anxiety disorder: Secondary | ICD-10-CM

## 2013-11-03 DIAGNOSIS — G4700 Insomnia, unspecified: Secondary | ICD-10-CM

## 2013-11-03 DIAGNOSIS — F3289 Other specified depressive episodes: Secondary | ICD-10-CM

## 2013-11-03 DIAGNOSIS — E7800 Pure hypercholesterolemia, unspecified: Secondary | ICD-10-CM

## 2013-11-03 MED ORDER — CYPROHEPTADINE HCL 4 MG PO TABS: 4.0000 mg | ORAL_TABLET | Freq: Every evening | ORAL | Status: DC | PRN

## 2013-11-03 MED ORDER — DEXLANSOPRAZOLE 60 MG PO CPDR: 60.0000 mg | DELAYED_RELEASE_CAPSULE | Freq: Every day | ORAL | Status: DC

## 2013-11-03 MED ORDER — ESCITALOPRAM OXALATE 10 MG PO TABS: 10.0000 mg | ORAL_TABLET | Freq: Every day | ORAL | Status: DC

## 2013-11-03 NOTE — Patient Instructions (Signed)
Continue your current medications.   Eventually, once your life stressors stabilize, can consider cutting lexapro in 1/2 for a couple of months, and consider stopping.  There is no reason to cut back the dose at this point, just consider trying at some point when stressors are less.  Continue to follow a low cholesterol diet.  Try and get at least 30 minutes of exercise daily.  Return fasting for your physical, at which time we will order more detailed cholesterol studies ([facility]) along with your other labs for physical.

## 2013-11-03 NOTE — Progress Notes (Signed)
Chief Complaint  Patient presents with  . Follow-up    nonfasting med check, lipid panel done 10/31/13.    Hyperlipidemia follow-up:  She has been busy dealing with her mother--she had a stroke in October, dementia got worse, and has been placed in memory care.  She is doing well there, but pt admits that she hasn't been taking care of herself like she should.  She hasn't been exercising as much, nor been as careful with her diet.  She is eating later, and having some more "comfort foods". In general, she has continued to follow a low cholesterol diet, but having snacks (cheetos, snickers).  She has gained 5-7 pounds in the last few months.  Depression/anxiety:  Very well controlled on her medication.  She has not needed to use xanax at all.    GERD:  Takes Dexilant daily during the week, not on the weekends. Only occasionally has heartburn, and she will sometimes use the medication on the weekend depending on her diet/food choices.  Denies dysphagia.  Headaches:  Only getting headaches infrequently, related to stress.  Relieved by Vanquish as needed.  Insomnia:  Takes periactin nightly to help with sleep.  She doesn't have any significant allergy symptoms, just mild congestion sometimes in the morning.  Past Medical History  Diagnosis Date  . GERD (gastroesophageal reflux disease)     EGD at [facility]  . Anxiety state, unspecified     on daily xanax for a couple of years in past  . Fibroid uterus     GYN--Dr. Seymour Bars  . Ovarian cyst   . Sickle cell trait   . Hearing loss in left ear 2008    s/p surgery 12/2012, has persistent hearing loss  . Hyperlipidemia     diet controlled  . Depression   . Arthritis     L AC joint/shoulder  . Anemia     history    Past Surgical History  Procedure Laterality Date  . Myomectomy      Dr. Seymour Bars - abdominal myomectomy  . Endometrial ablation    . Stapedes surgery Left 01/26/13    at [facility]; otosclerosis; persistent L hearing loss after surgery  .  Dilitation & currettage/hystroscopy with versapoint resection N/A 09/02/2013    Procedure: DILATATION & CURETTAGE/HYSTEROSCOPY WITH VERSAPOINT RESECTION;  Surgeon: Genia Del, MD;  Location: [facility];  Service: Gynecology;  Laterality: N/A;   History   Social History  . Marital Status: Married    Spouse Name: N/A    Number of Children: 1  . Years of Education: N/A   Occupational History  . works from home.  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 Smoking status: Never Smoker   . Smokeless tobacco: Never Used  . Alcohol Use: 1.8 oz/week    3 Glasses of wine per week     Comment: wine/beer with dinner, 1 daily  . Drug Use: No  . Sexual Activity: Yes    Partners: Male    Birth Control/ Protection: Post-menopausal   Other Topics Concern  . Not on file   Social History Narrative   Got Ph.D. In 06/2011.  Lives with her husband and daughter (in college).  Mother used to live with her, but placed in memory care 09/2013, after a stroke.   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   Current outpatient prescriptions:ALPRAZolam (XANAX) 0.25 MG tablet, Take 1 tablet (0.25 mg total)  by mouth every 8 (eight) hours as needed for sleep., Disp: 20 tablet, Rfl: 0;  ASA-APAP-Caff Buffered (VANQUISH PO), Take 2 tablets by mouth daily., Disp: , Rfl: ;  Calcium Carbonate-Vit D-Min (CALCIUM 1200 PO), Take 1 each by mouth daily., Disp: , Rfl: ;  cyanocobalamin 100 MCG tablet, Take 100 mcg by mouth daily.  , Disp: , Rfl:  cyproheptadine (PERIACTIN) 4 MG tablet, Take 4 mg by mouth at bedtime as needed (for sleep.)., Disp: , Rfl: ;  dexlansoprazole (DEXILANT) 60 MG capsule, Take 60 mg by mouth daily., Disp: , Rfl: ;  escitalopram (LEXAPRO) 10 MG tablet, Take 10 mg by mouth daily., Disp: , Rfl: ;  folic acid (FOLVITE) 400 MCG tablet, Take 400 mcg by mouth daily.  , Disp: , Rfl: ;  Iron-Vitamins (GERITOL PO),  Take 1 tablet by mouth.  , Disp: , Rfl:  Magnesium 500 MG TABS, Take 1 tablet by mouth daily.  , Disp: , Rfl: ;  Probiotic Product (RESTORA) CAPS, Take 1 capsule by mouth daily.  , Disp: , Rfl: ;  vitamin D, CHOLECALCIFEROL, 400 UNITS tablet, Take 400 Units by mouth daily., Disp: , Rfl: ;  zinc gluconate 50 MG tablet, Take 50 mg by mouth daily.  , Disp: , Rfl: ;  vitamin E 100 UNIT capsule, Take 100 Units by mouth daily.  , Disp: , Rfl:   Allergies  Allergen Reactions  . Amoxicillin-Pot Clavulanate Diarrhea and Nausea Only   ROS:  Denies fevers, chills, nausea, vomiting, bowel changes, urinary complaints. Denies dizziness, syncope, chest pain, palpitations, shortness of breath or URI symptoms. Occasional left shoulder pain. Infrequent headaches.    PHYSICAL EXAM: BP 122/88  Pulse 80  Ht 5' 5.25" (1.657 m)  Wt 162 lb (73.483 kg)  BMI 26.76 kg/m2 126/80 on repeat by MD, RA Well developed, pleasant female in no distress HEENT:  PERRL, EOMI, conjunctiva clear Neck: no lymphadenopathy, thyromegaly or carotid bruit Heart: regular rate and rhythm without murmur Lungs: clear bilaterally Abdomen: soft, nontender, no organomegaly or mass Psych: normal mood, affect, hygiene, grooming, speech, eye contact Neuro: alert and oriented. Normal strength, gait, cranial nerves intact  ASSESSMENT/PLAN:  Pure hypercholesterolemia - borderline LDL.  reviewed low cholesterol diet.  recommended checking hs-CRP--plan to do [facility] at her CPE in 6 months  GERD - controlled - Plan: dexlansoprazole (DEXILANT) 60 MG capsule  Depressive disorder, not elsewhere classified - controlled - Plan: escitalopram (LEXAPRO) 10 MG tablet  Anxiety state, unspecified - controlled - Plan: escitalopram (LEXAPRO) 10 MG tablet  Insomnia - controlled - Plan: cyproheptadine (PERIACTIN) 4 MG tablet  Declines flu shot, strongly encouraged  F/u for CPE in April, fasting.  Will do [facility] study along with other labs as  indicated for CPE at her visit.  30 min visit today, more than 1/2 spent counseling.  Counseled re: length of need for antidepressants, not to stop abruptly, can consider cutting dose in 1/2 for a few months prior to deciding to taper further, but NOT recommended at this point when still under a lot of stress, transition re: her mother.

## 2014-01-04 ENCOUNTER — Encounter: Payer: Self-pay | Admitting: Family Medicine

## 2014-01-04 ENCOUNTER — Ambulatory Visit (INDEPENDENT_AMBULATORY_CARE_PROVIDER_SITE_OTHER): Payer: 59 | Admitting: Family Medicine

## 2014-01-04 VITALS — BP 120/82 | HR 80 | Ht 65.25 in | Wt 166.0 lb

## 2014-01-04 DIAGNOSIS — M25562 Pain in left knee: Principal | ICD-10-CM

## 2014-01-04 DIAGNOSIS — M25569 Pain in unspecified knee: Secondary | ICD-10-CM

## 2014-01-04 DIAGNOSIS — M25561 Pain in right knee: Secondary | ICD-10-CM

## 2014-01-04 MED ORDER — MELOXICAM 15 MG PO TABS: 15.0000 mg | ORAL_TABLET | Freq: Every day | ORAL | Status: DC

## 2014-01-04 NOTE — Patient Instructions (Signed)
Ice the area for another 24 hours, then can try alternating heat and ice, continuing with whichever seems to be most effective (and alternating them if they both help). Use the anti-inflammatory once daily with food.  Do not take other anti-inflammatories with it (no aspirin, Vanquish, aleve, etc).  Tylenol products are okay to use in addition if needed for pain or headache.  Rest until pain has resolved, then okay to resume going to the gym, but start slowly and listen to your body

## 2014-01-04 NOTE — Progress Notes (Signed)
Chief Complaint  Patient presents with  . Knee Pain    B/L knee pain since this Saturday, had some swelling as well.   She has had problems on and off with knee pain, but never to this degree. She was doing a lot of cleaning over the weekend--putting away Christmas decorations, up and down stairs, vacuuming, etc.  She didn't have any pain while doing these activities, but later that night she started having pain in both knees.  Gradual onset of bilateral knee pain between getting mani-pedi and dinner.  She didn't notice swelling initially, but noticed swelling the following day.  Denies any injury or fall.  She is having focal tenderness medially at both knees, and the swelling was in the same location.  Swelling has improved since then.  She took one of her husband's Vimovo tablets (naproxen/esomeprazole).  This caused a headache, so she only took one dose.  She took 2 Vanquish this morning for a headache, which has also helped her knees (this is aspirin, acetaminophen and caffeine). She denies any locking of knee; had it feel like it almost gave out, but she relates it to pain, rather than true instability.  Past Medical History  Diagnosis Date  . GERD (gastroesophageal reflux disease)     EGD at [facility]  . Anxiety state, unspecified     on daily xanax for a couple of years in past  . Fibroid uterus     GYN--Dr. Dellis Filbert  . Ovarian cyst   . Sickle cell trait   . Hearing loss in left ear 2008    s/p surgery 12/2012, has persistent hearing loss  . Hyperlipidemia     diet controlled  . Depression   . Arthritis     L AC joint/shoulder  . Anemia     history    Past Surgical History  Procedure Laterality Date  . Myomectomy      Dr. Dellis Filbert - abdominal myomectomy  . Endometrial ablation    . Stapedes surgery Left 01/26/13    at [facility]; otosclerosis; persistent L hearing loss after surgery  . Dilitation & currettage/hystroscopy with versapoint resection N/A 09/02/2013    Procedure: DILATATION &  CURETTAGE/HYSTEROSCOPY WITH VERSAPOINT RESECTION;  Surgeon: Princess Bruins, MD;  Location: [facility];  Service: Gynecology;  Laterality: N/A;   History   Social History  . Marital Status: Married    Spouse Name: N/A    Number of Children: 1  . Years of Education: N/A   Occupational History  . works from home.  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 Smoking status: Never Smoker   . Smokeless tobacco: Never Used  . Alcohol Use: 1.8 oz/week    3 Glasses of wine per week     Comment: wine/beer with dinner, 1 daily  . Drug Use: No  . Sexual Activity: Yes    Partners: Male    Birth Control/ Protection: Post-menopausal   Other Topics Concern  . Not on file   Social History Narrative   Got Ph.D. In 06/2011.  Lives with her husband and daughter (in college).  Mother used to live with her, but placed in memory care 09/2013, after a stroke.   Works for [employer] for [employer], doing Pharmacologist.  Still teaching online course.   Current Outpatient Prescriptions on File Prior to Visit  Medication Sig Dispense Refill  . Calcium Carbonate-Vit D-Min (CALCIUM 1200 PO) Take 1 each by mouth daily.      Marland Kitchen  cyanocobalamin 100 MCG tablet Take 100 mcg by mouth daily.        . cyproheptadine (PERIACTIN) 4 MG tablet Take 1 tablet (4 mg total) by mouth at bedtime as needed (for sleep.).  30 tablet  11  . dexlansoprazole (DEXILANT) 60 MG capsule Take 1 capsule (60 mg total) by mouth daily.  30 capsule  11  . escitalopram (LEXAPRO) 10 MG tablet Take 1 tablet (10 mg total) by mouth daily.  30 tablet  11  . folic acid (FOLVITE) 782 MCG tablet Take 400 mcg by mouth daily.        . Iron-Vitamins (GERITOL PO) Take 1 tablet by mouth.        . Magnesium 500 MG TABS Take 1 tablet by mouth daily.        . Probiotic Product (RESTORA) CAPS Take 1 capsule by mouth daily.        . vitamin D, CHOLECALCIFEROL, 400 UNITS tablet Take 400 Units by mouth daily.       . vitamin E 100 UNIT capsule Take 100 Units by mouth daily.        Marland Kitchen zinc gluconate 50 MG tablet Take 50 mg by mouth daily.        Marland Kitchen ALPRAZolam (XANAX) 0.25 MG tablet Take 1 tablet (0.25 mg total) by mouth every 8 (eight) hours as needed for sleep.  20 tablet  0  . ASA-APAP-Caff Buffered (VANQUISH PO) Take 2 tablets by mouth as needed (migraine).        No current facility-administered medications on file prior to visit.   Allergies  Allergen Reactions  . Amoxicillin-Pot Clavulanate Diarrhea and Nausea Only   ROS:  Denies fevers, chills, headaches, dizziness, chest pain, nausea, vomiting, diarrhea, bleeding/bruising, skin rashes, URI symptoms, cough, shortness of breath or other complaints.  No GI complaints  PHYSICAL EXAM: BP 120/82  Pulse 80  Ht 5' 5.25" (1.657 m)  Wt 166 lb (75.297 kg)  BMI 27.42 kg/m2 Pleasant female, in no distress Knees: No effusion, warmth, swelling on exam today.  FROM, with only minimal crepitus on right, none on left. No tenderness at joint lines (medial or lateral).  No swelling or tenderness at anserine tendons/bursa.  She has a very small focal area of tenderness medially at knees, at area of MCL (but very small area), more tender on left than on the right.  No pain with varus/valgus stress on the left, but she has some mild medial pain with valgus stress on the right. No pain with flicking of lower leg (internal/external rotation) Negative Lachman test.  She has pain with McMurray, but no clicking/clunking.  Pain medially with valgus stressors.  ASSESSMENT/PLAN:  Bilateral knee pain - Plan: meloxicam (MOBIC) 15 MG tablet  Suspect tendonitis vs bursitis based on history, although exams suggests more of MCL strain.  Good endpoints.  Conservative treatment recommended.  Chart reviewed--previously tolerated meloxicam without problems.  Also briefly discussed topical voltaren--can call for rx if not tolerating the mobic.  Risks/side effects  reviewed.  Discussed heat vs ice (vs alternating), need for rest, then gradual return to activity, listening to body and stopping if having pain.  F/u prn

## 2014-01-30 ENCOUNTER — Telehealth: Payer: Self-pay | Admitting: Family Medicine

## 2014-01-30 MED ORDER — DICLOFENAC SODIUM 1 % TD GEL: 4.0000 g | Freq: Four times a day (QID) | TRANSDERMAL | Status: DC

## 2014-01-30 NOTE — Telephone Encounter (Signed)
Called to advise pt that med sent to pharmacy

## 2014-01-30 NOTE — Telephone Encounter (Signed)
Advise pt--topical voltaren gel was sent to her pharmacy.

## 2014-05-10 ENCOUNTER — Ambulatory Visit (INDEPENDENT_AMBULATORY_CARE_PROVIDER_SITE_OTHER): Payer: 59 | Admitting: Family Medicine

## 2014-05-10 ENCOUNTER — Encounter: Payer: Self-pay | Admitting: Family Medicine

## 2014-05-10 VITALS — BP 122/82 | HR 72 | Ht 68.5 in | Wt 164.0 lb

## 2014-05-10 DIAGNOSIS — R300 Dysuria: Secondary | ICD-10-CM

## 2014-05-10 DIAGNOSIS — E7800 Pure hypercholesterolemia, unspecified: Secondary | ICD-10-CM

## 2014-05-10 DIAGNOSIS — Z0000 Encounter for general adult medical examination without abnormal findings: Secondary | ICD-10-CM

## 2014-05-10 DIAGNOSIS — F3289 Other specified depressive episodes: Secondary | ICD-10-CM

## 2014-05-10 DIAGNOSIS — K219 Gastro-esophageal reflux disease without esophagitis: Secondary | ICD-10-CM

## 2014-05-10 DIAGNOSIS — G44209 Tension-type headache, unspecified, not intractable: Secondary | ICD-10-CM

## 2014-05-10 DIAGNOSIS — F411 Generalized anxiety disorder: Secondary | ICD-10-CM

## 2014-05-10 DIAGNOSIS — F329 Major depressive disorder, single episode, unspecified: Secondary | ICD-10-CM

## 2014-05-10 DIAGNOSIS — Z79899 Other long term (current) drug therapy: Secondary | ICD-10-CM

## 2014-05-10 LAB — POCT URINALYSIS DIPSTICK
BILIRUBIN UA: NEGATIVE
Glucose, UA: NEGATIVE
Ketones, UA: NEGATIVE
NITRITE UA: NEGATIVE
PH UA: 5
RBC UA: NEGATIVE
Spec Grav, UA: 1.01
UROBILINOGEN UA: NEGATIVE

## 2014-05-10 LAB — LIPID PANEL
CHOL/HDL RATIO: 4.2 ratio
Cholesterol: 213 mg/dL — ABNORMAL HIGH (ref 0–200)
HDL: 51 mg/dL (ref >39)
LDL Cholesterol: 143 mg/dL — ABNORMAL HIGH (ref 0–99)
TRIGLYCERIDES: 95 mg/dL (ref <150)
VLDL: 19 mg/dL (ref 0–40)

## 2014-05-10 LAB — CBC WITH DIFFERENTIAL/PLATELET
BASOS PCT: 1 % (ref 0–1)
Basophils Absolute: 0 10*3/uL (ref 0.0–0.1)
Eosinophils Absolute: 0.1 10*3/uL (ref 0.0–0.7)
Eosinophils Relative: 2 % (ref 0–5)
HEMATOCRIT: 40.7 % (ref 36.0–46.0)
HEMOGLOBIN: 13.9 g/dL (ref 12.0–15.0)
Lymphocytes Relative: 23 % (ref 12–46)
Lymphs Abs: 1.1 10*3/uL (ref 0.7–4.0)
MCH: 29 pg (ref 26.0–34.0)
MCHC: 34.2 g/dL (ref 30.0–36.0)
MCV: 84.8 fL (ref 78.0–100.0)
MONO ABS: 0.4 10*3/uL (ref 0.1–1.0)
MONOS PCT: 9 % (ref 3–12)
NEUTROS ABS: 3.2 10*3/uL (ref 1.7–7.7)
Neutrophils Relative %: 65 % (ref 43–77)
Platelets: 236 10*3/uL (ref 150–400)
RBC: 4.8 MIL/uL (ref 3.87–5.11)
RDW: 15.2 % (ref 11.5–15.5)
WBC: 4.9 10*3/uL (ref 4.0–10.5)

## 2014-05-10 LAB — COMPREHENSIVE METABOLIC PANEL
ALK PHOS: 60 U/L (ref 39–117)
ALT: 12 U/L (ref 0–35)
AST: 16 U/L (ref 0–37)
Albumin: 4.1 g/dL (ref 3.5–5.2)
BILIRUBIN TOTAL: 0.6 mg/dL (ref 0.2–1.2)
BUN: 18 mg/dL (ref 6–23)
CALCIUM: 9.4 mg/dL (ref 8.4–10.5)
CO2: 25 mEq/L (ref 19–32)
CREATININE: 1.17 mg/dL — AB (ref 0.50–1.10)
Chloride: 106 mEq/L (ref 96–112)
Glucose, Bld: 87 mg/dL (ref 70–99)
Potassium: 4.2 mEq/L (ref 3.5–5.3)
Sodium: 143 mEq/L (ref 135–145)
Total Protein: 7 g/dL (ref 6.0–8.3)

## 2014-05-10 LAB — TSH: TSH: 1.622 u[IU]/mL (ref 0.350–4.500)

## 2014-05-10 NOTE — Progress Notes (Signed)
Chief Complaint  Patient presents with  . Annual Exam    fasting physical, no other concerns   Laura Kirby is a 58 y.o. female who presents for a complete physical.  She sees Dr. Dellis Filbert for her GYN care.  She is here for routine med check for her chronic conditions.  She is asking for Restora samples (none available)  Knee pain--seen in January with bilateral knee pain.  She was originally given meloxicam, but then changed to voltaren gel in February. Knees are better, no longer needing to use the voltaren.  Hyperlipidemia follow-up:  Patient is reportedly following a low-fat, low cholesterol diet. She is due for repeat lipids.Her diet has improved since last check--making healthy smoothies, snacking less.  Last check showed: Lab Results  Component Value Date   CHOL 231* 10/31/2013   HDL 59 10/31/2013   LDLCALC 157* 10/31/2013   TRIG 75 10/31/2013   CHOLHDL 3.9 10/31/2013    Depression/anxiety: Very well controlled on her medication. She has not needed to use xanax at all. Denies side effects (other than possible weight gain).  She cut back on it to every other day, but she notices a difference on the days she doesn't take it.  GERD: Takes Dexilant daily with good control of symptoms.  Denies dysphagia.   Headaches: Only getting headaches infrequently, related to stress, and sometimes season.  No problems recently at all. Relieved by Vanquish as needed.   Insomnia: Takes periactin nightly to help with sleep. She doesn't have any significant allergy symptoms, just mild congestion sometimes in the morning. She will use an occasional claritin if needed for allergies.   Immunization History  Administered Date(s) Administered  . Td 11/17/2005  . Tdap 10/18/2011   Doesn't take flu shots  Last Pap smear: June 2013 with Dr. Dellis Filbert (had surgery in 08/2013) Last mammogram: 07/2013 Last colonoscopy: 05/2013 with Dr. Fuller Plan; polyps (adenomatous) and diverticulosis. Repeat in 5 years Last  DEXA: never  Dentist: twice a year  Ophtho: yearly  Exercise: yoga daily, and tries to get cardio 2x/week, often only once a week.  Weight-bearing exercise with 10# weight on the weekends.  Past Medical History  Diagnosis Date  . GERD (gastroesophageal reflux disease)     EGD at [facility]  . Anxiety state, unspecified     on daily xanax for a couple of years in past  . Fibroid uterus     GYN--Dr. Dellis Filbert  . Ovarian cyst   . Sickle cell trait   . Hearing loss in left ear 2008    s/p surgery 12/2012, has persistent hearing loss  . Hyperlipidemia     diet controlled  . Depression   . Arthritis     L AC joint/shoulder  . Anemia     history    Past Surgical History  Procedure Laterality Date  . Myomectomy      Dr. Dellis Filbert - abdominal myomectomy  . Endometrial ablation    . Stapedes surgery Left 01/26/13    at [facility]; otosclerosis; persistent L hearing loss after surgery  . Dilitation & currettage/hystroscopy with versapoint resection N/A 09/02/2013    Procedure: DILATATION & CURETTAGE/HYSTEROSCOPY WITH VERSAPOINT RESECTION;  Surgeon: Princess Bruins, MD;  Location: [facility];  Service: Gynecology;  Laterality: N/A;   History   Social History  . Marital Status: Married    Spouse Name: N/A    Number of Children: 1  . Years of Education: N/A   Occupational History  . works from home.  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 Smoking status: Never Smoker   . Smokeless tobacco: Never Used  . Alcohol Use: 1.8 oz/week    3 Glasses of wine per week     Comment: wine/beer with dinner, 1 daily  . Drug Use: No  . Sexual Activity: Yes    Partners: Male    Birth Control/ Protection: Post-menopausal   Other Topics Concern  . Not on file   Social History Narrative   Got Ph.D. In 06/2011.  Lives with her husband and daughter.  Mother used to live with her, but placed in memory care 09/2013, after a stroke.   Works for [employer] for  [employer], doing Pharmacologist.  Still teaching online course.    Outpatient Encounter Prescriptions as of 05/10/2014  Medication Sig Note  . BLACK COHOSH PO Take 1 tablet by mouth 2 (two) times daily.   . Calcium Carbonate-Vit D-Min (CALCIUM 1200 PO) Take 1 each by mouth daily.   . cyanocobalamin 100 MCG tablet Take 100 mcg by mouth daily.     . cyproheptadine (PERIACTIN) 4 MG tablet Take 1 tablet (4 mg total) by mouth at bedtime as needed (for sleep.).   Marland Kitchen dexlansoprazole (DEXILANT) 60 MG capsule Take 1 capsule (60 mg total) by mouth daily.   Marland Kitchen escitalopram (LEXAPRO) 10 MG tablet Take 1 tablet (10 mg total) by mouth daily.   . folic acid (FOLVITE) 233 MCG tablet Take 400 mcg by mouth daily.     Marland Kitchen glucosamine-chondroitin 500-400 MG tablet Take 1 tablet by mouth 3 (three) times daily.   . Iron-Vitamins (GERITOL PO) Take 1 tablet by mouth.     . Magnesium 500 MG TABS Take 1 tablet by mouth daily.     . Probiotic Product (RESTORA) CAPS Take 1 capsule by mouth daily.     . vitamin D, CHOLECALCIFEROL, 400 UNITS tablet Take 400 Units by mouth daily.   . vitamin E 100 UNIT capsule Take 100 Units by mouth daily.     Marland Kitchen zinc gluconate 50 MG tablet Take 50 mg by mouth daily.     Marland Kitchen ALPRAZolam (XANAX) 0.25 MG tablet Take 1 tablet (0.25 mg total) by mouth every 8 (eight) hours as needed for sleep. 11/03/2013: Very rarely needs  . ASA-APAP-Caff Buffered (VANQUISH PO) Take 2 tablets by mouth as needed (migraine).  05/10/2014: Uses prn, infrequently/sporadic  . diclofenac sodium (VOLTAREN) 1 % GEL Apply 4 g topically 4 (four) times daily. 05/10/2014: Not needing  . [DISCONTINUED] meloxicam (MOBIC) 15 MG tablet Take 1 tablet (15 mg total) by mouth daily.    Allergies  Allergen Reactions  . Amoxicillin-Pot Clavulanate Diarrhea and Nausea Only   ROS: The patient denies fever, vision changes, sore throat, breast concerns, chest pain, palpitations, dizziness, syncope, dyspnea on exertion, cough, swelling,  nausea, vomiting, diarrhea, constipation, abdominal pain, melena, hematochezia, indigestion/heartburn, hematuria, incontinence, dysuria, vaginal bleeding, discharge, odor or itch, joint pains, numbness, tingling, weakness, tremor, suspicious skin lesions.  Her weight is up 10# from last year, down 2# from her last visit. She had only one void where there was slight burning, in the last week.  None since.  Denies frequency (chronic urgency, unchanged).  PHYSICAL EXAM:  BP 122/82  Pulse 72  Ht 5' 8.5" (1.74 m)  Wt 164 lb (74.39 kg)  BMI 24.57 kg/m2  General Appearance:  Alert, cooperative, no distress, appears stated age   Head:  Normocephalic, without obvious  abnormality, atraumatic   Eyes:  PERRL, conjunctiva/corneas clear, EOM's intact, fundi  benign   Ears:  Normal TM's and external ear canals--very small canals. Scarring on L TM   Nose:  Nares normal, mucosa normal, no drainage or sinus tenderness   Throat:  Lips, mucosa, and tongue normal; teeth and gums normal   Neck:  Supple, no lymphadenopathy; thyroid: no enlargement/tenderness/nodules; no carotid  bruit or JVD   Back:  Spine nontender, no curvature, ROM normal, no CVA tenderness   Lungs:  Clear to auscultation bilaterally without wheezes, rales or ronchi; respirations unlabored   Chest Wall:  No tenderness or deformity   Heart:  Regular rate and rhythm, S1 and S2 normal, no murmur, rub  or gallop   Breast Exam:  Deferred to GYN   Abdomen:  Soft, non-tender, nondistended, normoactive bowel sounds,  no masses, no hepatosplenomegaly. Mild diffuse tenderness over uterus/suprapubic  Genitalia:  Deferred to GYN      Extremities:  No clubbing, cyanosis or edema. 2+ PT and DP pulses   Pulses:  2+ and symmetric all extremities   Skin:  Skin color, texture, turgor normal, no rashes or lesions   Lymph nodes:  Cervical, supraclavicular, and axillary nodes normal   Neurologic:  CNII-XII intact, normal strength, sensation and gait;  reflexes 2+ and sym metric throughout          Psych: Normal mood, affect, hygiene and grooming.   Urine dip: 2+ leuks, trace protein, no blood  ASSESSMENT/PLAN:  Routine general medical examination at a health care facility - Plan: POCT urinalysis dipstick, Lipid panel, Comprehensive metabolic panel, CBC with Differential, TSH  Pure hypercholesterolemia - diet has improved, due for recheck today - Plan: Lipid panel  Depressive disorder, not elsewhere classified - well controlled; some recurrent symptoms when cutting back dose.  continue daily - Plan: TSH  Anxiety state, unspecified - controlled.  not needing xanax  GERD - controlled with Dexilant  Tension headache - infrequent, r/b OTC meds  Encounter for long-term (current) use of other medications - Plan: Comprehensive metabolic panel, CBC with Differential  Dysuria - Plan: Urine culture  Discussed monthly self breast exams and yearly mammograms; at least 30 minutes of aerobic activity at least 5 days/week; proper sunscreen use reviewed; healthy diet, including goals of calcium and vitamin D intake and alcohol recommendations (less than or equal to 1 drink/day) reviewed; regular seatbelt use; changing batteries in smoke detectors. Immunization recommendations discussed--she declines flu shots, encouraged to get yearly.  Zostavax at age 44. Colonoscopy recommendations reviewed--UTD, due again in 05/2018   c-met, lipid, TSH, CBC  F/u 1 year, sooner if having problems. (h/o normal vit D in past)

## 2014-05-10 NOTE — Patient Instructions (Signed)
  HEALTH MAINTENANCE RECOMMENDATIONS:  It is recommended that you get at least 30 minutes of aerobic exercise at least 5 days/week (for weight loss, you may need as much as 60-90 minutes). This can be any activity that gets your heart rate up. This can be divided in 10-15 minute intervals if needed, but try and build up your endurance at least once a week.  Weight bearing exercise is also recommended twice weekly.  Eat a healthy diet with lots of vegetables, fruits and fiber.  "Colorful" foods have a lot of vitamins (ie green vegetables, tomatoes, red peppers, etc).  Limit sweet tea, regular sodas and alcoholic beverages, all of which has a lot of calories and sugar.  Up to 1 alcoholic drink daily may be beneficial for women (unless trying to lose weight, watch sugars).  Drink a lot of water.  Calcium recommendations are 1200-1500 mg daily (1500 mg for postmenopausal women or women without ovaries), and vitamin D 1000 IU daily.  This should be obtained from diet and/or supplements (vitamins), and calcium should not be taken all at once, but in divided doses.  Monthly self breast exams and yearly mammograms for women over the age of 65 is recommended.  Sunscreen of at least SPF 30 should be used on all sun-exposed parts of the skin when outside between the hours of 10 am and 4 pm (not just when at beach or pool, but even with exercise, golf, tennis, and yard work!)  Use a sunscreen that says "broad spectrum" so it covers both UVA and UVB rays, and make sure to reapply every 1-2 hours.  Remember to change the batteries in your smoke detectors when changing your clock times in the spring and fall.  Use your seat belt every time you are in a car, and please drive safely and not be distracted with cell phones and texting while driving.  I recommend that you continue the Lexapro daily.  If/when you are ready to see if you can get by at a lower dose, start cutting it in half, rather than taking it every  other day.

## 2014-05-12 LAB — URINE CULTURE

## 2014-07-28 LAB — HM MAMMOGRAPHY

## 2014-09-14 ENCOUNTER — Encounter: Payer: Self-pay | Admitting: Family Medicine

## 2014-09-15 ENCOUNTER — Encounter: Payer: Self-pay | Admitting: Internal Medicine

## 2014-10-02 ENCOUNTER — Encounter: Payer: Self-pay | Admitting: Family Medicine

## 2014-10-02 ENCOUNTER — Ambulatory Visit (INDEPENDENT_AMBULATORY_CARE_PROVIDER_SITE_OTHER): Payer: 59 | Admitting: Family Medicine

## 2014-10-02 VITALS — BP 100/70 | HR 72 | Temp 97.8°F | Ht 68.5 in | Wt 162.0 lb

## 2014-10-02 DIAGNOSIS — R109 Unspecified abdominal pain: Secondary | ICD-10-CM

## 2014-10-02 DIAGNOSIS — N3000 Acute cystitis without hematuria: Secondary | ICD-10-CM

## 2014-10-02 LAB — POCT URINALYSIS DIPSTICK
Bilirubin, UA: NEGATIVE
GLUCOSE UA: NEGATIVE
Ketones, UA: NEGATIVE
NITRITE UA: NEGATIVE
PH UA: 5
PROTEIN UA: NEGATIVE
Spec Grav, UA: 1.015
UROBILINOGEN UA: NEGATIVE

## 2014-10-02 MED ORDER — CIPROFLOXACIN HCL 250 MG PO TABS: 250.0000 mg | ORAL_TABLET | Freq: Two times a day (BID) | ORAL | Status: DC

## 2014-10-02 NOTE — Progress Notes (Signed)
Chief Complaint  Patient presents with  . Abdominal Cramping    and lbp x several weeks, gassy. Pt declined flu vaccine today.    A couple of weeks ago she developed some lower abdominal pain.  She usually is somewhat sensitive over the lower abdomen to the touch, but now she feels discomfort even without touching.  Mild discomfort when she voids.  Denies any urinary frequency, urgency, hematuria.  Denies odor or cloudiness to the urine.  She also notes pressure in the same location when she has a bowel movement.  Bowels are unchanged--has intermittent constipation.  Denies any mucus or blood in the stool. Denies fevers, chills, nausea or vomiting.  Denies flank pain.  She has been having some low back pain over the last 1-1.5 weeks.  This doesn't feel the same as prior UTI's.  Last documented culture+ UTI was 02/2013 (enterococcus).  She drinks cranberry juice every morning.  Of note--she had been put on Keflex a month ago by dermatologist for acne. She doesn't think it is acne, so she stopped taking the antibiotics 1-2 weeks ago, but then did take one tablet this morning.  Past Medical History  Diagnosis Date  . GERD (gastroesophageal reflux disease)     EGD at [facility]  . Anxiety state, unspecified     on daily xanax for a couple of years in past  . Fibroid uterus     GYN--Dr. Dellis Filbert  . Ovarian cyst   . Sickle cell trait   . Hearing loss in left ear 2008    s/p surgery 12/2012, has persistent hearing loss  . Hyperlipidemia     diet controlled  . Depression   . Arthritis     L AC joint/shoulder  . Anemia     history    Past Surgical History  Procedure Laterality Date  . Myomectomy      Dr. Dellis Filbert - abdominal myomectomy  . Endometrial ablation    . Stapedes surgery Left 01/26/13    at [facility]; otosclerosis; persistent L hearing loss after surgery  . Dilitation & currettage/hystroscopy with versapoint resection N/A 09/02/2013    Procedure: DILATATION & CURETTAGE/HYSTEROSCOPY WITH  VERSAPOINT RESECTION;  Surgeon: Princess Bruins, MD;  Location: [facility];  Service: Gynecology;  Laterality: N/A;   History   Social History  . Marital Status: Married    Spouse Name: N/A    Number of Children: 1  . Years of Education: N/A   Occupational History  . works from home.  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 Smoking status: Never Smoker   . Smokeless tobacco: Never Used  . Alcohol Use: 1.8 oz/week    3 Glasses of wine per week     Comment: wine/beer with dinner, 1 daily  . Drug Use: No  . Sexual Activity: Yes    Partners: Male    Birth Control/ Protection: Post-menopausal   Other Topics Concern  . Not on file   Social History Narrative   Got Ph.D. In 06/2011.  Lives with her husband and daughter.  Mother used to live with her, but placed in memory care 09/2013, after a stroke.   Works for [employer] for [employer], doing Pharmacologist.  Still teaching online course.    Outpatient Encounter Prescriptions as of 10/02/2014  Medication Sig Note  . ALPRAZolam (XANAX) 0.25 MG tablet Take 1 tablet (0.25 mg total) by mouth every 8 (eight) hours as needed for sleep. 11/03/2013: Very  rarely needs  . BLACK COHOSH PO Take 1 tablet by mouth 2 (two) times daily.   . Calcium Carbonate-Vit D-Min (CALCIUM 1200 PO) Take 1 each by mouth daily.   . cephALEXin (KEFLEX) 500 MG capsule Take 500 mg by mouth 2 (two) times daily. 10/02/2014: Prescribed by dermatologist.  Buddy Duty 9/10--she hadn't taken it on a week or two, but took a dose this morning  . cyanocobalamin 100 MCG tablet Take 100 mcg by mouth daily.     . cyproheptadine (PERIACTIN) 4 MG tablet Take 1 tablet (4 mg total) by mouth at bedtime as needed (for sleep.). 10/02/2014: Hasn't been using recently; just prn (not needing)  . dexlansoprazole (DEXILANT) 60 MG capsule Take 1 capsule (60 mg total) by mouth daily.   Marland Kitchen escitalopram (LEXAPRO) 10 MG tablet Take 1 tablet (10 mg  total) by mouth daily.   . folic acid (FOLVITE) 366 MCG tablet Take 400 mcg by mouth daily.     Marland Kitchen glucosamine-chondroitin 500-400 MG tablet Take 1 tablet by mouth 3 (three) times daily.   . Iron-Vitamins (GERITOL PO) Take 1 tablet by mouth.     . Magnesium 500 MG TABS Take 1 tablet by mouth daily.     . Probiotic Product (RESTORA) CAPS Take 1 capsule by mouth daily.     . vitamin D, CHOLECALCIFEROL, 400 UNITS tablet Take 400 Units by mouth daily.   . vitamin E 100 UNIT capsule Take 100 Units by mouth daily.     Marland Kitchen zinc gluconate 50 MG tablet Take 50 mg by mouth daily.     . ASA-APAP-Caff Buffered (VANQUISH PO) Take 2 tablets by mouth as needed (migraine).  05/10/2014: Uses prn, infrequently/sporadic  . diclofenac sodium (VOLTAREN) 1 % GEL Apply 4 g topically 4 (four) times daily. 05/10/2014: Not needing    Allergies  Allergen Reactions  . Amoxicillin-Pot Clavulanate Diarrhea and Nausea Only    ROS:  No fevers, chills, URI symptoms, cough, shortness of breath, nausea, vomiting, flank pain.  No vaginal discharge, odor, itch.  Ongoing problem with rash on face (not relieved by Keflex). No chest pain, shortness of breath, bleeding/bruising.  PHYSICAL EXAM: BP 100/70  Pulse 72  Temp(Src) 97.8 F (36.6 C) (Tympanic)  Ht 5' 8.5" (1.74 m)  Wt 162 lb (73.483 kg)  BMI 24.27 kg/m2 Well developed, pleasant female in no distress Heart: regular rate and rhythm Lungs: clear bilaterally Abdomen: Suprapubic tenderness to palpation Minimal epigastric tenderness. No organomegaly or  Mass. No guarding or rebound tenderness Back:  no CVA tenderness.  She has mild tenderness at lower lumbar paraspinous muscles, extending to just above SI joints bilaterally.  No significant spasm. Psych: normal mood, affect Neuro: alert and oriented.  Normal cranial nerves, gait  Urine dip:  3+ leuks, trace blood  ASSESSMENT/PLAN:  Acute cystitis without hematuria - Plan: ciprofloxacin (CIPRO) 250 MG  tablet  Abdominal pain, unspecified abdominal location - Plan: POCT Urinalysis Dipstick  Drink plenty of fluids. Start the antibiotics.  You should notice improvement within 48 hours.  If you develop fever, chills, vomiting, or pain over the kidneys (at flanks), please contact us.  Otherwise, contact us if your symptoms are not improving by the end of the week, or if they are worse.  Stop taking the antibiotic (cephalexin) from the dermatologist while you are taking the Cipro.  We will contact you with your culture results when available.   Refuses flu shots

## 2014-10-02 NOTE — Patient Instructions (Signed)
Drink plenty of fluids. Start the antibiotics.  You should notice improvement within 48 hours.  If you develop fever, chills, vomiting, or pain over the kidneys (at flanks), please contact us.  Otherwise, contact us if your symptoms are not improving by the end of the week, or if they are worse.  Stop taking the antibiotic (cephalexin) from the dermatologist while you are taking the Cipro.  We will contact you with your culture results when available.

## 2014-11-15 ENCOUNTER — Other Ambulatory Visit: Payer: Self-pay | Admitting: Family Medicine

## 2014-11-15 NOTE — Telephone Encounter (Signed)
Is this okay to fill until next May?

## 2014-11-15 NOTE — Telephone Encounter (Signed)
Left message for patient to call and schedule appt after 05/11/15.

## 2014-11-15 NOTE — Telephone Encounter (Signed)
Yes, but she needs to schedule her May CPE

## 2014-12-01 ENCOUNTER — Telehealth: Payer: Self-pay | Admitting: Family Medicine

## 2014-12-01 ENCOUNTER — Other Ambulatory Visit: Payer: Self-pay | Admitting: Family Medicine

## 2014-12-01 DIAGNOSIS — F411 Generalized anxiety disorder: Secondary | ICD-10-CM

## 2014-12-01 DIAGNOSIS — F325 Major depressive disorder, single episode, in full remission: Secondary | ICD-10-CM

## 2014-12-01 MED ORDER — ESCITALOPRAM OXALATE 10 MG PO TABS: 10.0000 mg | ORAL_TABLET | Freq: Every day | ORAL | Status: DC

## 2014-12-01 NOTE — Telephone Encounter (Signed)
Needs refill on lexapro, completely out . Advised patient more than likely she needs to schedule follow up appt.  Patient requested that I send refill request back, she doesn't think you will need her to come in for appointment   [facility]

## 2014-12-01 NOTE — Telephone Encounter (Signed)
Refill was sent.  She does not need visit; already scheduled for yearly exam in May

## 2014-12-01 NOTE — Telephone Encounter (Signed)
Is this okay to refill? 

## 2014-12-06 ENCOUNTER — Other Ambulatory Visit: Payer: Self-pay | Admitting: Family Medicine

## 2015-01-24 ENCOUNTER — Encounter: Payer: Self-pay | Admitting: Family Medicine

## 2015-01-24 ENCOUNTER — Ambulatory Visit (INDEPENDENT_AMBULATORY_CARE_PROVIDER_SITE_OTHER): Payer: 59 | Admitting: Family Medicine

## 2015-01-24 VITALS — BP 122/82 | HR 72 | Ht 68.5 in | Wt 163.0 lb

## 2015-01-24 DIAGNOSIS — K625 Hemorrhage of anus and rectum: Secondary | ICD-10-CM

## 2015-01-24 DIAGNOSIS — K649 Unspecified hemorrhoids: Secondary | ICD-10-CM

## 2015-01-24 NOTE — Progress Notes (Signed)
Chief Complaint  Patient presents with  . Blood In Stools    noticed some bright red blood on toilet paper yesteday and again today, no known history of hemorrhoids. Started colon cleanse on Sunday and wonders if there is any correlation. Liquid only Sunday. No BM untul Tuesday.    She is using 40 drops of the cleanse in water, twice daily (Essiac tonic--many herbs and glycerine).  She started this on 1/24 after being on a cruise--overeating and excess alcohol. She didn't have a bowel movement until Tuesday--normal color, large amount (she hadn't had a stool x 3 days). Normal caliber. She had some straining.  It was not painful to pass the stool. She noticed BRB on the toilet paper after this bowel movement.  No blood in the toilet water or mixed within stool, just on the paper, just with the first wipe.  No blood noted in underwear.  Next bowel movement was this morning, and she again noted BRB on the toilet paper only.  Stool was normal, no straining, no pain.  A few weeks ago she noted some anal itching and burning in the anal area.  She denies any nausea, vomiting, abdominal pain.  Her last colonoscopy was 05/2013, and was found to have tubular adenoma.  Due again 05/2018.  She is exercising daily on stationery bicycle, and dancing. She has some pain in her lower back, she relates to using the exercise bike.  Started bothering her today, and has been wearing heels today.  PMH, PSH, SH reviewed. Outpatient Encounter Prescriptions as of 01/24/2015  Medication Sig Note  . BLACK COHOSH PO Take 1 tablet by mouth 2 (two) times daily.   . Calcium Carbonate-Vit D-Min (CALCIUM 1200 PO) Take 1 each by mouth daily.   . cyanocobalamin 100 MCG tablet Take 100 mcg by mouth daily.     . cyproheptadine (PERIACTIN) 4 MG tablet TAKE ONE TABLET BY MOUTH AT BEDTIME AS NEEDED FOR SLEEP   . DEXILANT 60 MG capsule TAKE ONE CAPSULE BY MOUTH ONCE DAILY   . escitalopram (LEXAPRO) 10 MG tablet Take 1 tablet (10 mg  total) by mouth daily.   . folic acid (FOLVITE) 841 MCG tablet Take 400 mcg by mouth daily.     Marland Kitchen glucosamine-chondroitin 500-400 MG tablet Take 1 tablet by mouth 3 (three) times daily.   . Iron-Vitamins (GERITOL PO) Take 1 tablet by mouth.     . Magnesium 500 MG TABS Take 1 tablet by mouth daily.     . Misc Natural Products (COLON HERBAL CLEANSER PO) Take 40 drops by mouth 2 (two) times daily. 01/24/2015: Essiac Tonic Herbal Cleanse  . Probiotic Product (RESTORA) CAPS Take 1 capsule by mouth daily.     . vitamin D, CHOLECALCIFEROL, 400 UNITS tablet Take 400 Units by mouth daily.   . vitamin E 100 UNIT capsule Take 100 Units by mouth daily.     Marland Kitchen zinc gluconate 50 MG tablet Take 50 mg by mouth daily.     . ASA-APAP-Caff Buffered (VANQUISH PO) Take 2 tablets by mouth as needed (migraine).  05/10/2014: Uses prn, infrequently/sporadic  . [DISCONTINUED] ALPRAZolam (XANAX) 0.25 MG tablet Take 1 tablet (0.25 mg total) by mouth every 8 (eight) hours as needed for sleep. 11/03/2013: Very rarely needs  . [DISCONTINUED] cephALEXin (KEFLEX) 500 MG capsule Take 500 mg by mouth 2 (two) times daily. 10/02/2014: Prescribed by dermatologist.  Buddy Duty 9/10--she hadn't taken it on a week or two, but took a dose this morning  . [  DISCONTINUED] ciprofloxacin (CIPRO) 250 MG tablet Take 1 tablet (250 mg total) by mouth 2 (two) times daily.   . [DISCONTINUED] diclofenac sodium (VOLTAREN) 1 % GEL Apply 4 g topically 4 (four) times daily. 05/10/2014: Not needing   Allergies  Allergen Reactions  . Amoxicillin-Pot Clavulanate Diarrhea and Nausea Only   ROS: no fever, chills, urinary complaints, URI symptoms, cough, shortness of breath, chest pain, rash.  Slight back pain today. No headaches, dizziness or other concerns, except as per HPI.  PHYSICAL EXAM: BP 122/82 mmHg  Pulse 72  Ht 5' 8.5" (1.74 m)  Wt 163 lb (73.936 kg)  BMI 24.42 kg/m2 Well appearing, pleasant female in no distress Rectal: Small nonbleeding  hemorrhoid seen externally, posteriorly.  No fissure noted. On internal exam there was no significant stool in vault.  There was a small internal hemorrhoid felt posteriorly (and caused some discomfort to patient)--not thrombosed. Smear of stool was heme positive  ASSESSMENT/PLAN:  Rectal bleeding  Bleeding hemorrhoid   Rectal bleeding--suspect related to hemorrhoids, external and possibly internal as well.  Did not perform anoscopy--may need to return to have this done if having ongoing problems despite treatment for hemorhhoids.  Drink plenty of water. Eat a high fiber diet. Consider stool softener if stools remain hard.  Try using over-the-counter hemorrhoid medications such as Anusol HC.  The cream is used for external hemorrhoid, and the suppository is used for internal hemorrhoids.

## 2015-01-24 NOTE — Patient Instructions (Signed)
Drink plenty of water. Eat a high fiber diet. Consider stool softener if stools remain hard.  Try using over-the-counter hemorrhoid medications such as Anusol HC.  The cream is used for external hemorrhoid, and the suppository is used for internal hemorrhoids.  Hemorrhoids Hemorrhoids are swollen veins around the rectum or anus. There are two types of hemorrhoids:   Internal hemorrhoids. These occur in the veins just inside the rectum. They may poke through to the outside and become irritated and painful.  External hemorrhoids. These occur in the veins outside the anus and can be felt as a painful swelling or hard lump near the anus. CAUSES  Pregnancy.   Obesity.   Constipation or diarrhea.   Straining to have a bowel movement.   Sitting for long periods on the toilet.  Heavy lifting or other activity that caused you to strain.  Anal intercourse. SYMPTOMS   Pain.   Anal itching or irritation.   Rectal bleeding.   Fecal leakage.   Anal swelling.   One or more lumps around the anus.  DIAGNOSIS  Your caregiver may be able to diagnose hemorrhoids by visual examination. Other examinations or tests that may be performed include:   Examination of the rectal area with a gloved hand (digital rectal exam).   Examination of anal canal using a small tube (scope).   A blood test if you have lost a significant amount of blood.  A test to look inside the colon (sigmoidoscopy or colonoscopy). TREATMENT Most hemorrhoids can be treated at home. However, if symptoms do not seem to be getting better or if you have a lot of rectal bleeding, your caregiver may perform a procedure to help make the hemorrhoids get smaller or remove them completely. Possible treatments include:   Placing a rubber band at the base of the hemorrhoid to cut off the circulation (rubber band ligation).   Injecting a chemical to shrink the hemorrhoid (sclerotherapy).   Using a tool to burn  the hemorrhoid (infrared light therapy).   Surgically removing the hemorrhoid (hemorrhoidectomy).   Stapling the hemorrhoid to block blood flow to the tissue (hemorrhoid stapling).  HOME CARE INSTRUCTIONS   Eat foods with fiber, such as whole grains, beans, nuts, fruits, and vegetables. Ask your doctor about taking products with added fiber in them (fibersupplements).  Increase fluid intake. Drink enough water and fluids to keep your urine clear or pale yellow.   Exercise regularly.   Go to the bathroom when you have the urge to have a bowel movement. Do not wait.   Avoid straining to have bowel movements.   Keep the anal area dry and clean. Use wet toilet paper or moist towelettes after a bowel movement.   Medicated creams and suppositories may be used or applied as directed.   Only take over-the-counter or prescription medicines as directed by your caregiver.   Take warm sitz baths for 15-20 minutes, 3-4 times a day to ease pain and discomfort.   Place ice packs on the hemorrhoids if they are tender and swollen. Using ice packs between sitz baths may be helpful.   Put ice in a plastic bag.   Place a towel between your skin and the bag.   Leave the ice on for 15-20 minutes, 3-4 times a day.   Do not use a donut-shaped pillow or sit on the toilet for long periods. This increases blood pooling and pain.  SEEK MEDICAL CARE IF:  You have increasing pain and swelling that is  not controlled by treatment or medicine.  You have uncontrolled bleeding.  You have difficulty or you are unable to have a bowel movement.  You have pain or inflammation outside the area of the hemorrhoids. MAKE SURE YOU:  Understand these instructions.  Will watch your condition.  Will get help right away if you are not doing well or get worse. Document Released: 12/12/2000 Document Revised: 12/01/2012 Document Reviewed: 10/19/2012 [facility] Patient Information 2015 [place], [place].  This information is not intended to replace advice given to you by your health care provider. Make sure you discuss any questions you have with your health care provider.

## 2015-02-01 ENCOUNTER — Ambulatory Visit (INDEPENDENT_AMBULATORY_CARE_PROVIDER_SITE_OTHER): Payer: 59 | Admitting: Family Medicine

## 2015-02-01 ENCOUNTER — Encounter: Payer: Self-pay | Admitting: Family Medicine

## 2015-02-01 VITALS — BP 110/72 | HR 72 | Ht 68.5 in | Wt 163.0 lb

## 2015-02-01 DIAGNOSIS — M542 Cervicalgia: Secondary | ICD-10-CM

## 2015-02-01 NOTE — Patient Instructions (Signed)
  Use ice to your neck/shoulder for the next 24 hours, then switch to heat (you can try heat earlier if ice isn't effective). Do the range of motion exercises/stretches shown. Expect pain to be worse tomorrow. Use 2 Aleve twice daily with food until your pain has improved. If you develop significant muscle spasm in your neck, we might need to add muscle relaxants.  Call the office if this is the case--and let us know when your is bothering you most (at night, keeping you awake, or during the day--to help [place] decide which muscle relaxant might be best for you, more sedating or less).
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
## 2015-02-14 ENCOUNTER — Telehealth: Payer: Self-pay | Admitting: Family Medicine

## 2015-02-14 NOTE — Telephone Encounter (Signed)
Pt requesting meds to be called in for bladder infection. She states she has a history of getting bladder infections frequently and Dr Tomi Bamberger know her history so pt did not want an appointment. She has frequent urination and bad low back pain that all started on Monday. She started drinking a lot of water on Monday when symptoms started but symptoms has just gotten worse.

## 2015-02-14 NOTE — Telephone Encounter (Signed)
We specifically like people to come when they have UTI's frequently specifically so that we can obtain cultures and identify antibiotic resistance.  If we start on something and they call in a few days and aren't better, we won't have a culture to fall back on to direct antibiotic therapy.  Last UTI was October, no culture done.  Last culture done was 04/2014 and it didn't actually show infection.  Prefer to double book and OV today vs appt tomorrow for those reasons

## 2015-02-14 NOTE — Telephone Encounter (Signed)
Left detailed message with Dr.Knapp' s recommendations and asked her to please call back to schedule an appt for today (douuble booking ok) or tomorrow.

## 2015-05-24 ENCOUNTER — Encounter: Payer: 59 | Admitting: Family Medicine

## 2015-05-25 ENCOUNTER — Encounter: Payer: 59 | Admitting: Family Medicine

## 2015-06-13 ENCOUNTER — Ambulatory Visit (INDEPENDENT_AMBULATORY_CARE_PROVIDER_SITE_OTHER): Payer: 59 | Admitting: Family Medicine

## 2015-06-13 ENCOUNTER — Encounter: Payer: Self-pay | Admitting: Family Medicine

## 2015-06-13 VITALS — BP 120/68 | HR 80 | Temp 97.7°F | Ht 65.5 in | Wt 163.8 lb

## 2015-06-13 DIAGNOSIS — R8290 Unspecified abnormal findings in urine: Secondary | ICD-10-CM | POA: Diagnosis not present

## 2015-06-13 DIAGNOSIS — Z5181 Encounter for therapeutic drug level monitoring: Secondary | ICD-10-CM

## 2015-06-13 DIAGNOSIS — E7800 Pure hypercholesterolemia, unspecified: Secondary | ICD-10-CM

## 2015-06-13 DIAGNOSIS — Z0000 Encounter for general adult medical examination without abnormal findings: Secondary | ICD-10-CM

## 2015-06-13 DIAGNOSIS — F324 Major depressive disorder, single episode, in partial remission: Secondary | ICD-10-CM | POA: Diagnosis not present

## 2015-06-13 DIAGNOSIS — G4700 Insomnia, unspecified: Secondary | ICD-10-CM

## 2015-06-13 DIAGNOSIS — K219 Gastro-esophageal reflux disease without esophagitis: Secondary | ICD-10-CM

## 2015-06-13 DIAGNOSIS — G44209 Tension-type headache, unspecified, not intractable: Secondary | ICD-10-CM

## 2015-06-13 DIAGNOSIS — F411 Generalized anxiety disorder: Secondary | ICD-10-CM

## 2015-06-13 DIAGNOSIS — F325 Major depressive disorder, single episode, in full remission: Secondary | ICD-10-CM

## 2015-06-13 DIAGNOSIS — K649 Unspecified hemorrhoids: Secondary | ICD-10-CM

## 2015-06-13 LAB — CBC WITH DIFFERENTIAL/PLATELET
BASOS ABS: 0 10*3/uL (ref 0.0–0.1)
Basophils Relative: 0 % (ref 0–1)
Eosinophils Absolute: 0.1 10*3/uL (ref 0.0–0.7)
Eosinophils Relative: 2 % (ref 0–5)
HEMATOCRIT: 42.6 % (ref 36.0–46.0)
Hemoglobin: 14.2 g/dL (ref 12.0–15.0)
Lymphocytes Relative: 22 % (ref 12–46)
Lymphs Abs: 1.2 10*3/uL (ref 0.7–4.0)
MCH: 29 pg (ref 26.0–34.0)
MCHC: 33.3 g/dL (ref 30.0–36.0)
MCV: 86.9 fL (ref 78.0–100.0)
MONO ABS: 0.4 10*3/uL (ref 0.1–1.0)
MPV: 9.7 fL (ref 8.6–12.4)
Monocytes Relative: 7 % (ref 3–12)
NEUTROS PCT: 69 % (ref 43–77)
Neutro Abs: 3.7 10*3/uL (ref 1.7–7.7)
Platelets: 266 10*3/uL (ref 150–400)
RBC: 4.9 MIL/uL (ref 3.87–5.11)
RDW: 14.5 % (ref 11.5–15.5)
WBC: 5.4 10*3/uL (ref 4.0–10.5)

## 2015-06-13 LAB — POCT URINALYSIS DIPSTICK
BILIRUBIN UA: NEGATIVE
Glucose, UA: NEGATIVE
KETONES UA: NEGATIVE
Nitrite, UA: NEGATIVE
PH UA: 6
Protein, UA: NEGATIVE
RBC UA: NEGATIVE
Spec Grav, UA: 1.02
Urobilinogen, UA: NEGATIVE

## 2015-06-13 LAB — COMPREHENSIVE METABOLIC PANEL
ALBUMIN: 4.1 g/dL (ref 3.5–5.2)
ALT: 16 U/L (ref 0–35)
AST: 20 U/L (ref 0–37)
Alkaline Phosphatase: 58 U/L (ref 39–117)
BUN: 15 mg/dL (ref 6–23)
CALCIUM: 9.5 mg/dL (ref 8.4–10.5)
CHLORIDE: 106 meq/L (ref 96–112)
CO2: 27 meq/L (ref 19–32)
CREATININE: 1.1 mg/dL (ref 0.50–1.10)
Glucose, Bld: 83 mg/dL (ref 70–99)
POTASSIUM: 4.4 meq/L (ref 3.5–5.3)
Sodium: 141 mEq/L (ref 135–145)
TOTAL PROTEIN: 7.2 g/dL (ref 6.0–8.3)
Total Bilirubin: 0.5 mg/dL (ref 0.2–1.2)

## 2015-06-13 LAB — LIPID PANEL
CHOLESTEROL: 214 mg/dL — AB (ref 0–200)
HDL: 59 mg/dL (ref >=46)
LDL Cholesterol: 137 mg/dL — ABNORMAL HIGH (ref 0–99)
TRIGLYCERIDES: 90 mg/dL (ref <150)
Total CHOL/HDL Ratio: 3.6 Ratio
VLDL: 18 mg/dL (ref 0–40)

## 2015-06-13 LAB — TSH: TSH: 1.413 u[IU]/mL (ref 0.350–4.500)

## 2015-06-13 MED ORDER — ESCITALOPRAM OXALATE 10 MG PO TABS: 10.0000 mg | ORAL_TABLET | Freq: Every day | ORAL | Status: DC

## 2015-06-13 MED ORDER — CYPROHEPTADINE HCL 4 MG PO TABS: 4.0000 mg | ORAL_TABLET | Freq: Every evening | ORAL | Status: DC | PRN

## 2015-06-13 NOTE — Patient Instructions (Signed)
  HEALTH MAINTENANCE RECOMMENDATIONS:  It is recommended that you get at least 30 minutes of aerobic exercise at least 5 days/week (for weight loss, you may need as much as 60-90 minutes). This can be any activity that gets your heart rate up. This can be divided in 10-15 minute intervals if needed, but try and build up your endurance at least once a week.  Weight bearing exercise is also recommended twice weekly.  Eat a healthy diet with lots of vegetables, fruits and fiber.  "Colorful" foods have a lot of vitamins (ie green vegetables, tomatoes, red peppers, etc).  Limit sweet tea, regular sodas and alcoholic beverages, all of which has a lot of calories and sugar.  Up to 1 alcoholic drink daily may be beneficial for women (unless trying to lose weight, watch sugars).  Drink a lot of water.  Calcium recommendations are 1200-1500 mg daily (1500 mg for postmenopausal women or women without ovaries), and vitamin D 1000 IU daily.  This should be obtained from diet and/or supplements (vitamins), and calcium should not be taken all at once, but in divided doses.  Monthly self breast exams and yearly mammograms for women over the age of 24 is recommended.  Sunscreen of at least SPF 30 should be used on all sun-exposed parts of the skin when outside between the hours of 10 am and 4 pm (not just when at beach or pool, but even with exercise, golf, tennis, and yard work!)  Use a sunscreen that says "broad spectrum" so it covers both UVA and UVB rays, and make sure to reapply every 1-2 hours.  Remember to change the batteries in your smoke detectors when changing your clock times in the spring and fall.  Use your seat belt every time you are in a car, and please drive safely and not be distracted with cell phones and texting while driving.  Consider cutting back the Lexapro dose to just 1/2 tablet every day.  Do this for 1-2 months, and if no worsening depression or anxiety, you can try tapering off.  You  would take 1/2 tablet every other day for 2 weeks and then stop.  If you develop recurrent symptoms, simply go back up to the normal dose, and continue on it for the year.  Consider trying Dexilant every other day--but ensure to take it on prior to meals on the days that you eat foods that you know will trigger reflux symptoms.  If needed daily, it is fine to continue to take it every day.,
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
## 2015-07-04 ENCOUNTER — Other Ambulatory Visit: Payer: Self-pay | Admitting: Family Medicine

## 2015-07-20 ENCOUNTER — Other Ambulatory Visit: Payer: Self-pay | Admitting: Family Medicine

## 2015-07-20 NOTE — Telephone Encounter (Signed)
Dr.knapp pt has been in for a CPE last month you did discuss the med in your note but did not refill is this okay to refill

## 2015-07-20 NOTE — Telephone Encounter (Signed)
If it is discussed at the visit, but not prescribed (and not discontinued--notes state she should continue it), it simply means that she didn't need a refill at that time; this is okay to refill x 6 months

## 2015-07-30 HISTORY — PX: WISDOM TOOTH EXTRACTION: SHX21

## 2015-08-06 LAB — HM MAMMOGRAPHY: HM Mammogram: NEGATIVE

## 2015-08-09 ENCOUNTER — Encounter: Payer: Self-pay | Admitting: *Deleted

## 2015-09-26 ENCOUNTER — Other Ambulatory Visit: Payer: Self-pay | Admitting: *Deleted

## 2015-09-26 ENCOUNTER — Telehealth: Payer: Self-pay | Admitting: *Deleted

## 2015-09-26 DIAGNOSIS — Z01818 Encounter for other preprocedural examination: Secondary | ICD-10-CM

## 2015-09-26 NOTE — Telephone Encounter (Signed)
Ok, as long as we aren't responsible for interpretation, happy to fax results of the labs and EKG for them to evaluate

## 2015-09-26 NOTE — Telephone Encounter (Signed)
Patient having B/L upper lid blepharoplasty and need EKG and lab (CBC,CMP and PT/PTT) for pre-op. Results needs to be faxed to [phone] per Donnelly Angelica, NP. Patient wants to come here for these so she doesn't have to drive to [place]. Is this okay? Should I schedule her for an appt with you because of the EKG or is nurse visit ok?

## 2015-09-26 NOTE — Telephone Encounter (Signed)
Left message for pt to call back to schedule NV. Future orders placed.

## 2015-09-27 ENCOUNTER — Other Ambulatory Visit: Payer: 59

## 2015-09-27 DIAGNOSIS — Z01818 Encounter for other preprocedural examination: Secondary | ICD-10-CM

## 2015-09-27 LAB — CBC WITH DIFFERENTIAL/PLATELET
Basophils Absolute: 0 10*3/uL (ref 0.0–0.1)
Basophils Relative: 0 % (ref 0–1)
EOS PCT: 1 % (ref 0–5)
Eosinophils Absolute: 0 10*3/uL (ref 0.0–0.7)
HCT: 41.7 % (ref 36.0–46.0)
Hemoglobin: 14.2 g/dL (ref 12.0–15.0)
Lymphocytes Relative: 32 % (ref 12–46)
Lymphs Abs: 1.6 10*3/uL (ref 0.7–4.0)
MCH: 29.1 pg (ref 26.0–34.0)
MCHC: 34.1 g/dL (ref 30.0–36.0)
MCV: 85.5 fL (ref 78.0–100.0)
MONO ABS: 0.4 10*3/uL (ref 0.1–1.0)
MPV: 9.6 fL (ref 8.6–12.4)
Monocytes Relative: 8 % (ref 3–12)
NEUTROS ABS: 2.9 10*3/uL (ref 1.7–7.7)
Neutrophils Relative %: 59 % (ref 43–77)
Platelets: 236 10*3/uL (ref 150–400)
RBC: 4.88 MIL/uL (ref 3.87–5.11)
RDW: 15 % (ref 11.5–15.5)
WBC: 4.9 10*3/uL (ref 4.0–10.5)

## 2015-09-27 LAB — COMPREHENSIVE METABOLIC PANEL
ALK PHOS: 56 U/L (ref 33–130)
ALT: 14 U/L (ref 6–29)
AST: 19 U/L (ref 10–35)
Albumin: 4.2 g/dL (ref 3.6–5.1)
BUN: 17 mg/dL (ref 7–25)
CO2: 25 mmol/L (ref 20–31)
CREATININE: 1.08 mg/dL — AB (ref 0.50–1.05)
Calcium: 9.4 mg/dL (ref 8.6–10.4)
Chloride: 104 mmol/L (ref 98–110)
GLUCOSE: 82 mg/dL (ref 65–99)
Potassium: 4.1 mmol/L (ref 3.5–5.3)
SODIUM: 138 mmol/L (ref 135–146)
Total Bilirubin: 0.6 mg/dL (ref 0.2–1.2)
Total Protein: 7 g/dL (ref 6.1–8.1)

## 2015-09-28 LAB — PROTIME-INR
INR: 0.94 (ref <1.50)
Prothrombin Time: 12.7 seconds (ref 11.6–15.2)

## 2015-09-28 LAB — APTT: APTT: 30 s (ref 24–37)

## 2015-10-11 ENCOUNTER — Encounter: Payer: Self-pay | Admitting: Family Medicine

## 2015-10-11 ENCOUNTER — Ambulatory Visit (INDEPENDENT_AMBULATORY_CARE_PROVIDER_SITE_OTHER): Payer: 59 | Admitting: Family Medicine

## 2015-10-11 VITALS — BP 120/72 | HR 68 | Ht 65.5 in | Wt 167.0 lb

## 2015-10-11 DIAGNOSIS — I4510 Unspecified right bundle-branch block: Secondary | ICD-10-CM | POA: Insufficient documentation

## 2015-10-11 DIAGNOSIS — E7800 Pure hypercholesterolemia, unspecified: Secondary | ICD-10-CM

## 2015-10-11 DIAGNOSIS — I450 Right fascicular block: Secondary | ICD-10-CM

## 2015-10-11 DIAGNOSIS — H02409 Unspecified ptosis of unspecified eyelid: Secondary | ICD-10-CM

## 2015-10-11 NOTE — Patient Instructions (Signed)
You are cleared for surgery. It appears you had stress test and possibly echocardiogram back in 2012 that we don't have records for.

## 2015-10-11 NOTE — Progress Notes (Signed)
Chief Complaint  Patient presents with  . Medical Clearance    for eyelid surgery @ [facility] scheduled for 10/17/15   . Flu Vaccine    declined.   She is scheduled 10/19 for excess upper eyelid skin to be removed.  She has decreased field of vision due to the drooping eyelids.    She had labs and EKG done at our office (out of convenience, never reviewed by MD, ordered by other physician). She was told the EKG was abnormal, and needed to get medical clearance. She presents today to f/u on this, and for clearance.  She had recent anesthesia for wisdom tooth excision without problems. Last prior anesthesia was 08/2013 for [facility] hysteroscopy, also with no problems. Her family history was reviewed, along with her full history. Patient recalls having cardiac evaluation in the past. She recalls seeing Dr. Karlton Lemon in her 40's--recalls slightly abnormal EKG, getting sent to cardiologist and having additional testing that was all normal. Old chart was pulled and records reviewed.  Visit 11/2010 to Dr. Karlton Lemon for physical discussed chest pain. Referred for stress test.  01/2011 visit mentioned that metabolic stress test results suggested heart failure.  She was referred to cardiologist (Dr. Irven Shelling name was in the chart for referral) for an echocardiogram and she was sent for CXR. CXR's from 2/24 and 3/1 of 2012 were both normal. I see no results of echo in the chart, nor of the stress chest.  She denies any chest pain, palpitations, shortness of breath, dizziness, syncope, or any exertional symptoms or problems.  Exercising regularly without problems.  PMH, PSH, SH reviewed.  Outpatient Encounter Prescriptions as of 10/11/2015  Medication Sig Note  . cyproheptadine (PERIACTIN) 4 MG tablet Take 1 tablet (4 mg total) by mouth at bedtime as needed. for sleep   . DEXILANT 60 MG capsule TAKE ONE CAPSULE BY MOUTH ONCE DAILY   . escitalopram (LEXAPRO) 10 MG tablet Take 1 tablet (10 mg total) by mouth daily.   .  Probiotic Product (RESTORA) CAPS Take 1 capsule by mouth daily.     . ASA-APAP-Caff Buffered (VANQUISH PO) Take 2 tablets by mouth as needed (migraine).  06/13/2015: Uses prn--very infrequent (a few times/year)  . BLACK COHOSH PO Take 1 tablet by mouth 2 (two) times daily.   . Calcium Carbonate-Vit D-Min (CALCIUM 1200 PO) Take 1 each by mouth daily.   . folic acid (FOLVITE) 222 MCG tablet Take 400 mcg by mouth daily.     Marland Kitchen glucosamine-chondroitin 500-400 MG tablet Take 1 tablet by mouth 3 (three) times daily.   . Iron-Vitamins (GERITOL PO) Take 1 tablet by mouth.     . Magnesium 500 MG TABS Take 1 tablet by mouth daily.     . vitamin D, CHOLECALCIFEROL, 400 UNITS tablet Take 400 Units by mouth daily.   . vitamin E 100 UNIT capsule Take 100 Units by mouth daily.     Marland Kitchen zinc gluconate 50 MG tablet Take 50 mg by mouth daily.      No facility-administered encounter medications on file as of 10/11/2015.   OTC meds were stopped this past weekend in anticipation of upcoming surgery.  Allergies  Allergen Reactions  . Amoxicillin-Pot Clavulanate Diarrhea and Nausea Only   Family History  Problem Relation Age of Onset  . Hyperlipidemia Mother   . Hypertension Mother   . Diabetes Mother   . Dementia Mother   . Irritable bowel syndrome Mother   . Stroke Mother 37  . Alcohol abuse  Father   . Kidney disease Father   . Ulcers Father   . Allergies Daughter   . Eczema Daughter   . Cancer Maternal Aunt     breast cancer (60's)  . Cancer Maternal Uncle 70    colon  . Colon cancer Maternal Uncle 10  . Diabetes Maternal Grandfather   . Stroke Maternal Grandfather   . Heart disease Maternal Grandmother   . Cancer Paternal Aunt     ?unsure of type (stomach vs ovarian)   No significant h/o heart disease in the family.  ROS: no fever, chills, URI symptoms, headaches, dizziness, shortness of breath, chest pain, palpitations, syncope. Denies GI or GU complaints, joint pains, bleeding, bruising,  rash, edema or other concerns.  PHYSICAL EXAM: BP 120/72 mmHg  Pulse 68  Ht 5' 5.5" (1.664 m)  Wt 167 lb (75.751 kg)  BMI 27.36 kg/m2 Well developed, pleasant female in no distress HEENT: PERRL, EOMI, conjunctiva clear.  Mild ptosis.  TM's and EAC's normal, OP clear Neck: No lymphadenopathy, thyromegaly or carotid bruit Heart: regular rate and rhythm without murmur, rub, gallop Lungs: clear bilaterally Abdomen: soft, nontender, no organomegaly or mass Extremities: no edema, 2+ pulses Psych: normal mood, affect, hygeine and grooming Neuro: alert and oriented, cranial nerves intact, normal gait, strength.  EKG from 09/27/15: NSR, rate 69, incomplete RBBB, otherwise normal, no ischemic changes. No prior EKG's for comparison.  Lab Results  Component Value Date   WBC 4.9 09/27/2015   HGB 14.2 09/27/2015   HCT 41.7 09/27/2015   MCV 85.5 09/27/2015   PLT 236 09/27/2015     Chemistry      Component Value Date/Time   NA 138 09/27/2015 0001   K 4.1 09/27/2015 0001   CL 104 09/27/2015 0001   CO2 25 09/27/2015 0001   BUN 17 09/27/2015 0001   CREATININE 1.08* 09/27/2015 0001   CREATININE 1.10 02/27/2011 0534      Component Value Date/Time   CALCIUM 9.4 09/27/2015 0001   ALKPHOS 56 09/27/2015 0001   AST 19 09/27/2015 0001   ALT 14 09/27/2015 0001   BILITOT 0.6 09/27/2015 0001     Glucose 82  INR 0.94 PTT 30  Lab Results  Component Value Date   CHOL 214* 06/13/2015   HDL 59 06/13/2015   LDLCALC 137* 06/13/2015   TRIG 90 06/13/2015   CHOLHDL 3.6 06/13/2015    ASSESSMENT/PLAN:  Incomplete RBBB  Pure hypercholesterolemia - low cholesterol diet reviewed  Ptosis, unspecified laterality - plan is for surgical repair next week  Cleared for sugery. She will try and get prior records to [place] (from stress test, echo), but I see nothing on her exam, history or test results to warrant further evaluation at this time.   Mild hyperlipidemia--HDL is normal, overall is fine.   LDL is above goal--encouraged regular, lowfat diet, regular exercise, further weight loss  Refuses flu shot

## 2015-10-12 ENCOUNTER — Telehealth: Payer: Self-pay | Admitting: Family Medicine

## 2015-10-12 NOTE — Telephone Encounter (Signed)
Laura Kirby from [facility] called they recv'd Dr. Johnsie Kindred note but needs last office visit faxed [phone], this was faxed

## 2015-10-17 HISTORY — PX: PTOSIS REPAIR: SHX6568

## 2015-10-31 ENCOUNTER — Telehealth: Payer: Self-pay | Admitting: *Deleted

## 2015-10-31 ENCOUNTER — Encounter: Payer: Self-pay | Admitting: Gastroenterology

## 2015-10-31 NOTE — Telephone Encounter (Signed)
Asking for referral to see Dr.Stark. Has seen him in the past for colonosopy and tried to schedule her own appt but he is booked until January. If we call and refer she can get in sooner. Is this okay? She is having issues with her hemorrhoids and would like him to check them out and make sure it's not something more.

## 2015-10-31 NOTE — Telephone Encounter (Signed)
[place] for referral to Dr. Fuller Plan for hemorrhoids (seen January with bleeding, int/ext hemorrhoids)

## 2015-11-01 ENCOUNTER — Other Ambulatory Visit: Payer: Self-pay | Admitting: *Deleted

## 2015-11-01 DIAGNOSIS — K649 Unspecified hemorrhoids: Secondary | ICD-10-CM

## 2015-11-27 ENCOUNTER — Telehealth: Payer: Self-pay | Admitting: Physician Assistant

## 2015-11-27 NOTE — Telephone Encounter (Signed)
Left message for patient to call back  

## 2015-11-27 NOTE — Telephone Encounter (Signed)
Patient is notified that no earlier openings at this time.  She is advised I will add her to the cancellation list and she is welcome to call and check for openings.

## 2015-12-03 ENCOUNTER — Encounter: Payer: Self-pay | Admitting: Family Medicine

## 2015-12-03 ENCOUNTER — Ambulatory Visit (INDEPENDENT_AMBULATORY_CARE_PROVIDER_SITE_OTHER): Payer: 59 | Admitting: Family Medicine

## 2015-12-03 VITALS — BP 114/76 | HR 72 | Ht 65.5 in | Wt 168.4 lb

## 2015-12-03 DIAGNOSIS — K625 Hemorrhage of anus and rectum: Secondary | ICD-10-CM

## 2015-12-03 DIAGNOSIS — K602 Anal fissure, unspecified: Secondary | ICD-10-CM | POA: Diagnosis not present

## 2015-12-03 MED ORDER — DILTIAZEM GEL 2 %: 1.0000 "application " | Freq: Three times a day (TID) | CUTANEOUS | Status: DC

## 2015-12-03 NOTE — Progress Notes (Signed)
Chief Complaint  Patient presents with  . Blood In Stools    since Oct (has been seen for this in this office). Not getting any better, blood with each bowel movement, roughly.   . Flu Vaccine    declined.   She has had recurrent bleeding rectally, after almost every bowel movement.  This has been going on since October.  Blood is bright red, on the toilet paper, and sometimes sees blood in the toilet water after a very large bowel movement.  She has some burning in the anal area as well. Stools remain normal in color (not black or maroon).  She has bowel movements usually almost daily--hasn't had one in 2 days.  She does have some straining, and has intermittent constipation, passing hard balls.  Hard like this about a couple of times/month.  She started taking Benefiber last week. She had been traveling prior to this, so perhaps not drinking as much water and eating a little differently than baseline. Denies any abdominal pain (infrequent, every few months), denies weight loss.  She is quite concerned over the length of time she has had these symptoms, worried about possible anemia.  She had called Dr. Fuller Plan for an appointment, but he couldn't see her until January.  She has an appointment with his PA later this month.  She has only noticed blood on the toilet paper, no ongoing bleeding or bloody stains/spotting.  Not passing clots.  She had also been seen by me for similar complaint in 12/2014--at that time she was noted to have internal hemorrhoid that was inflamed, as well as a noninflamed external hemorrhoid. Couldn't r/o healed fissure.  She has gone through 3 tubes of Preparation H--didn't notice any benefit. She was using the creams (not the suppositories).    Her last colonoscopy was 05/2013, and was found to have tubular adenoma. Due again 05/2018.    PMH, PSH, SH reviewed.  Outpatient Encounter Prescriptions as of 12/03/2015  Medication Sig Note  . BLACK COHOSH PO Take 1 tablet by  mouth 2 (two) times daily.   . Calcium Carbonate-Vit D-Min (CALCIUM 1200 PO) Take 1 each by mouth daily.   . cyproheptadine (PERIACTIN) 4 MG tablet Take 1 tablet (4 mg total) by mouth at bedtime as needed. for sleep   . DEXILANT 60 MG capsule TAKE ONE CAPSULE BY MOUTH ONCE DAILY   . escitalopram (LEXAPRO) 10 MG tablet Take 1 tablet (10 mg total) by mouth daily.   . folic acid (FOLVITE) A999333 MCG tablet Take 400 mcg by mouth daily.     Marland Kitchen glucosamine-chondroitin 500-400 MG tablet Take 1 tablet by mouth 3 (three) times daily.   . Iron-Vitamins (GERITOL PO) Take 1 tablet by mouth.     . Magnesium 500 MG TABS Take 1 tablet by mouth daily.     . Probiotic Product (RESTORA) CAPS Take 1 capsule by mouth daily.     . vitamin D, CHOLECALCIFEROL, 400 UNITS tablet Take 400 Units by mouth daily.   . vitamin E 100 UNIT capsule Take 100 Units by mouth daily.     Marland Kitchen zinc gluconate 50 MG tablet Take 50 mg by mouth daily.     . ASA-APAP-Caff Buffered (VANQUISH PO) Take 2 tablets by mouth as needed (migraine).  06/13/2015: Uses prn--very infrequent (a few times/year)  . diltiazem 2 % GEL Apply 1 application topically 3 (three) times daily.    No facility-administered encounter medications on file as of 12/03/2015.   Allergies  Allergen  Reactions  . Amoxicillin-Pot Clavulanate Diarrhea and Nausea Only   ROS: no fever, chills, abdominal pain, urinary complaints, vaginal discharge, weight loss.  Since her last visit, she had eye surgery for ptosis. Field of vision is better, still has some swelling.   PHYSICAL EXAM: BP 114/76 mmHg  Pulse 72  Ht 5' 5.5" (1.664 m)  Wt 168 lb 6.4 oz (76.386 kg)  BMI 27.59 kg/m2  Well developed, anxious female. Abdomen: soft, nontender, normal bowel sounds Rectal exam:  Erythema/inflammation noted posteriorly at anus,consistent with recently healed fissure.  She is tender over this area.  No external or internal hemorrhoids are noted/palpable (anoscopy not performed). No active  bleeding.  Stool appears light brown, but is heme +. No palpable masses.  ASSESSMENT/PLAN:  Rectal bleeding  Anal fissure - Plan: diltiazem 2 % GEL   2% topical diltiazem 2-3 times daily for up to 2 months--phoned to [place].  Reassured that her symptoms are consistent with fissure, and exacerbated by constipation.    Make sure to drink at least 8 glasses of water daily. Eat a high fiber diet and/or take a fiber supplement daily. I recommend starting a stool softener such as Colace, 2 tablets every day (docusate sodium).

## 2015-12-03 NOTE — Patient Instructions (Signed)
  2% topical diltiazem 2-3 times daily for up to 2 months. Verdene Lennert will be phoning this in for you--if it is at a pharmacy other than [facility], we will let you know.  Make sure to drink at least 8 glasses of water daily. Eat a high fiber diet and/or take a fiber supplement daily. I recommend starting a stool softener such as Colace, 2 tablets every day (docusate sodium).  Anal Fissure, Adult An anal fissure is a small tear or crack in the skin around the anus. Bleeding from a fissure usually stops on its own within a few minutes. However, bleeding will often occur again with each bowel movement until the crack heals. CAUSES This condition may be caused by:  Passing large, hard stool (feces).  Frequent diarrhea.  Constipation.  Inflammatory bowel disease (Crohn disease or ulcerative colitis).  Infections.  Anal sex. SYMPTOMS Symptoms of this condition include:  Bleeding from the rectum.  Small amounts of blood seen on your stool, on toilet paper, or in the toilet after a bowel movement.  Painful bowel movements.  Itching or irritation around the anus. DIAGNOSIS A health care provider may diagnose this condition by closely examining the anal area. An anal fissure can usually be seen with careful inspection. In some cases, a rectal exam may be performed, or a short tube (anoscope) may be used to examine the anal canal. TREATMENT Treatment for this condition may include:  Taking steps to avoid constipation. This may include making changes to your diet, such as increasing your intake of fiber or fluid.  Taking fiber supplements. These supplements can soften your stool to help make bowel movements easier. Your health care provider may also prescribe a stool softener if your stool is often hard.  Taking sitz baths. This may help to heal the tear.  Using medicated creams or ointments. These may be prescribed to lessen discomfort. HOME CARE INSTRUCTIONS Eating and  Drinking  Avoid foods that may be constipating, such as bananas and dairy products.  Drink enough fluid to keep your urine clear or pale yellow.  Maintain a diet that is high in fruits, whole grains, and vegetables. General Instructions  Keep the anal area as clean and dry as possible.  Take sitz baths as told by your health care provider. Do not use soap in the sitz baths.  Take over-the-counter and prescription medicines only as told by your health care provider.  Use creams or ointments only as told by your health care provider.  Keep all follow-up visits as told by your health care provider. This is important. SEEK MEDICAL CARE IF:  You have more bleeding.  You have a fever.  You have diarrhea that is mixed with blood.  You continue to have pain.  Your problem is getting worse rather than better.   This information is not intended to replace advice given to you by your health care provider. Make sure you discuss any questions you have with your health care provider.   Document Released: 12/15/2005 Document Revised: 09/05/2015 Document Reviewed: 03/12/2015 Elsevier Interactive Patient Education [employer].

## 2015-12-19 ENCOUNTER — Encounter: Payer: Self-pay | Admitting: Physician Assistant

## 2015-12-19 ENCOUNTER — Ambulatory Visit (INDEPENDENT_AMBULATORY_CARE_PROVIDER_SITE_OTHER): Payer: 59 | Admitting: Physician Assistant

## 2015-12-19 VITALS — BP 118/60 | HR 72 | Ht 65.5 in | Wt 167.0 lb

## 2015-12-19 DIAGNOSIS — K602 Anal fissure, unspecified: Secondary | ICD-10-CM | POA: Diagnosis not present

## 2015-12-19 DIAGNOSIS — K648 Other hemorrhoids: Secondary | ICD-10-CM

## 2015-12-19 MED ORDER — DILTIAZEM GEL 2 %: 1.0000 "application " | Freq: Two times a day (BID) | CUTANEOUS | Status: DC

## 2015-12-19 NOTE — Patient Instructions (Signed)
We have given you a written prescription for you to take to your pharmacy.  Use Recticare as needed.   Follow up as needed.

## 2015-12-19 NOTE — Progress Notes (Signed)
Patient ID: Laura Kirby, female   DOB: 1956-06-09, 59 y.o.   MRN: [id number]     History of Present Illness: Laura Kirby is a pleasant 59 year old female known to Dr. Fuller Plan from prior colonoscopy. She had a colonoscopy 05/31/2013 at which time 2 sessile polyps measuring 5-6 mm were noted at the cecum and polypectomy was performed with cold snare. She was also noted to have mild diverticulosis. She was advised to have a repeat colonoscopy in 5 years. She presents today with a several week history of rectal pain and bleeding. She states the discomfort started off as a dull ache but then became knifelike and burning. She was evaluated by her PCP and treated with hemorrhoidal cream which she states divided no relief. She was then reevaluated by her PCP and prescribed diltiazem gel which she says has provided excellent relief. She has no rectal bleeding and she has less pain but has an occasional soreness she is moving her bowels on a regular basis.   Past Medical History  Diagnosis Date  . GERD (gastroesophageal reflux disease)     EGD at [facility]  . Anxiety state, unspecified     on daily xanax for a couple of years in past  . Fibroid uterus     GYN--Dr. Dellis Filbert  . Ovarian cyst   . Sickle cell trait ([place])   . Hearing loss in left ear 2008    s/p surgery 12/2012, has persistent hearing loss  . Hyperlipidemia     diet controlled  . Depression   . Arthritis     L AC joint/shoulder  . Anemia     history     Past Surgical History  Procedure Laterality Date  . Myomectomy      Dr. Dellis Filbert - abdominal myomectomy  . Endometrial ablation    . Stapedes surgery Left 01/26/13    at [facility]; otosclerosis; persistent L hearing loss after surgery  . Dilitation & currettage/hystroscopy with versapoint resection N/A 09/02/2013    Procedure: DILATATION & CURETTAGE/HYSTEROSCOPY WITH VERSAPOINT RESECTION;  Surgeon: Princess Bruins, MD;  Location: [facility];  Service: Gynecology;  Laterality:  N/A;  . Wisdom tooth extraction  07/2015  . Ptosis repair Bilateral 10/17/2015   Family History  Problem Relation Age of Onset  . Hyperlipidemia Mother   . Hypertension Mother   . Diabetes Mother   . Dementia Mother   . Irritable bowel syndrome Mother   . Stroke Mother 20  . Alcohol abuse Father   . Kidney disease Father   . Ulcers Father   . Allergies Daughter   . Eczema Daughter   . Cancer Maternal Aunt     breast cancer (60's)  . Cancer Maternal Uncle 70    colon  . Colon cancer Maternal Uncle 72  . Diabetes Maternal Grandfather   . Stroke Maternal Grandfather   . Heart disease Maternal Grandmother   . Cancer Paternal Aunt     ?unsure of type (stomach vs ovarian)   Social History  Substance Use Topics  . Smoking status: Never Smoker   . Smokeless tobacco: Never Used  . Alcohol Use: 1.8 oz/week    3 Glasses of wine per week     Comment: wine/beer with dinner, 1 daily   Current Outpatient Prescriptions  Medication Sig Dispense Refill  . ASA-APAP-Caff Buffered (VANQUISH PO) Take 2 tablets by mouth as needed (migraine).     Marland Kitchen BLACK COHOSH PO Take 1 tablet by mouth 2 (two) times daily.    Marland Kitchen  Calcium Carbonate-Vit D-Min (CALCIUM 1200 PO) Take 1 each by mouth daily.    . cyproheptadine (PERIACTIN) 4 MG tablet Take 1 tablet (4 mg total) by mouth at bedtime as needed. for sleep 30 tablet 11  . DEXILANT 60 MG capsule TAKE ONE CAPSULE BY MOUTH ONCE DAILY 30 capsule 5  . diltiazem 2 % GEL Apply 1 application topically 3 (three) times daily. 30 g 1  . escitalopram (LEXAPRO) 10 MG tablet Take 1 tablet (10 mg total) by mouth daily. 30 tablet 11  . folic acid (FOLVITE) A999333 MCG tablet Take 400 mcg by mouth daily.      Marland Kitchen glucosamine-chondroitin 500-400 MG tablet Take 1 tablet by mouth 3 (three) times daily.    . Iron-Vitamins (GERITOL PO) Take 1 tablet by mouth.      . Magnesium 500 MG TABS Take 1 tablet by mouth daily.      . Probiotic Product (RESTORA) CAPS Take 1 capsule by mouth  daily.      . Turmeric 500 MG TABS Take 500 mg by mouth.    . vitamin D, CHOLECALCIFEROL, 400 UNITS tablet Take 400 Units by mouth daily.    . vitamin E 100 UNIT capsule Take 100 Units by mouth daily.      Marland Kitchen zinc gluconate 50 MG tablet Take 50 mg by mouth daily.      Marland Kitchen diltiazem 2 % GEL Apply 1 application topically 2 (two) times daily. 60 g 0   No current facility-administered medications for this visit.   Allergies  Allergen Reactions  . Amoxicillin-Pot Clavulanate Diarrhea and Nausea Only     Review of Systems: Gen: Denies any fever, chills, sweats, anorexia, fatigue, weakness, malaise, weight loss, and sleep disorder CV: Denies chest pain, angina, palpitations, syncope, orthopnea, PND, peripheral edema, and claudication. Resp: Denies dyspnea at rest, dyspnea with exercise, cough, sputum, wheezing, coughing up blood, and pleurisy. GI: Denies vomiting blood, jaundice, and fecal incontinence.   Denies dysphagia or odynophagia. GU : Denies urinary burning, blood in urine, urinary frequency, urinary hesitancy, nocturnal urination, and urinary incontinence. MS: Denies joint pain, limitation of movement, and swelling, stiffness, low back pain, extremity pain. Denies muscle weakness, cramps, atrophy.  Derm: Denies rash, itching, dry skin, hives, moles, warts, or unhealing ulcers.  Psych: Denies depression, anxiety, memory loss, suicidal ideation, hallucinations, paranoia, and confusion. Heme: Denies bruising, bleeding, and enlarged lymph nodes. Neuro:  Denies any headaches, dizziness, paresthesia Endo:  Denies any problems with DM, thyroid, adrenal    Physical Exam: Ht 5' 5.5" (1.664 m)  Wt 167 lb (75.751 kg)  BMI 27.36 kg/m2 General: Pleasant, well developed , female in no acute distress Head: Normocephalic and atraumatic Eyes:  sclerae anicteric, conjunctiva pink  Ears: Normal auditory acuity Lungs: Clear throughout to auscultation Heart: Regular rate and rhythm Abdomen: Soft,  non distended, non-tender. No masses, no hepatomegaly. Normal bowel sounds Rectal: No external hemorrhoids. Brown stool, Hemoccult negative. Anoscopy reveals internal hemorrhoids and a small posterior anal fissure. Musculoskeletal: Symmetrical with no gross deformities  Extremities: No edema  Neurological: Alert oriented x 4, grossly nonfocal Psychological:  Alert and cooperative. Normal mood and affect  Assessment and Recommendations: 59 year old female with a several week history of rectal discomfort and scant rectal bleeding, here for evaluation. Her discomfort and bleeding were likely in combination due to the internal hemorrhoids and the anal fissure. She will continue diltiazem ointment 2% rectally twice daily for 6 more weeks. She will use Recticare cream as needed for discomfort.  She will use a stool softener as needed at bedtime. She will follow up on a when necessary basis.        Musa Rewerts, Deloris Ping 12/19/2015,

## 2015-12-19 NOTE — Progress Notes (Signed)
Reviewed and agree with management plan.  Nygel Prokop T. Cesar Rogerson, MD FACG 

## 2016-04-15 ENCOUNTER — Other Ambulatory Visit: Payer: Self-pay | Admitting: Family Medicine

## 2016-05-28 ENCOUNTER — Telehealth: Payer: Self-pay | Admitting: *Deleted

## 2016-05-28 NOTE — Telephone Encounter (Signed)
Patient called and her trigs were 143, 04/28/16. Started Accutane through derm and had them checked again yesterday and they went up to 163. She has stopped the Accutane because of this increase. She is very concerned about this and wonders if the numbers will come down on their own once medication is out of her system or does she need to take meds now?

## 2016-05-28 NOTE — Telephone Encounter (Signed)
If she had a good reason to start accutane, there is no reason that she needs to stop based on this.  In future, try and fast if going to worry about her TG

## 2016-05-28 NOTE — Telephone Encounter (Signed)
Patient advised.

## 2016-05-28 NOTE — Telephone Encounter (Signed)
Spoke with patient and she now wants me to ask you if you think she should continue doing the accutane? And she was NOT fasting, as suspected. Please advise. Thanks.

## 2016-05-28 NOTE — Telephone Encounter (Signed)
Left message for patient to return my call.

## 2016-05-28 NOTE — Telephone Encounter (Signed)
Advise pt not to worry about the TG.  Anything less than 200 will be fine (goal is <150 but under 200 is okay).  They monitor lipids bc they can see MUCH higher increases sometimes.  Tell her not to worry about the 163.  Also, if she wasn't fasting, that will increase TG (dermatologists don't always remind them to fast for the labs).

## 2016-06-16 ENCOUNTER — Encounter: Payer: Self-pay | Admitting: Family Medicine

## 2016-06-16 ENCOUNTER — Ambulatory Visit (INDEPENDENT_AMBULATORY_CARE_PROVIDER_SITE_OTHER): Payer: 59 | Admitting: Family Medicine

## 2016-06-16 VITALS — BP 116/60 | HR 68 | Ht 66.25 in | Wt 164.2 lb

## 2016-06-16 DIAGNOSIS — G4700 Insomnia, unspecified: Secondary | ICD-10-CM

## 2016-06-16 DIAGNOSIS — R8271 Bacteriuria: Secondary | ICD-10-CM

## 2016-06-16 DIAGNOSIS — Z1159 Encounter for screening for other viral diseases: Secondary | ICD-10-CM | POA: Diagnosis not present

## 2016-06-16 DIAGNOSIS — K648 Other hemorrhoids: Secondary | ICD-10-CM

## 2016-06-16 DIAGNOSIS — K219 Gastro-esophageal reflux disease without esophagitis: Secondary | ICD-10-CM

## 2016-06-16 DIAGNOSIS — F325 Major depressive disorder, single episode, in full remission: Secondary | ICD-10-CM | POA: Diagnosis not present

## 2016-06-16 DIAGNOSIS — F411 Generalized anxiety disorder: Secondary | ICD-10-CM

## 2016-06-16 DIAGNOSIS — Z0000 Encounter for general adult medical examination without abnormal findings: Secondary | ICD-10-CM | POA: Diagnosis not present

## 2016-06-16 DIAGNOSIS — Z5181 Encounter for therapeutic drug level monitoring: Secondary | ICD-10-CM

## 2016-06-16 DIAGNOSIS — K602 Anal fissure, unspecified: Secondary | ICD-10-CM | POA: Insufficient documentation

## 2016-06-16 DIAGNOSIS — E7800 Pure hypercholesterolemia, unspecified: Secondary | ICD-10-CM | POA: Diagnosis not present

## 2016-06-16 LAB — COMPREHENSIVE METABOLIC PANEL
ALBUMIN: 4 g/dL (ref 3.6–5.1)
ALK PHOS: 58 U/L (ref 33–130)
ALT: 22 U/L (ref 6–29)
AST: 23 U/L (ref 10–35)
BUN: 17 mg/dL (ref 7–25)
CALCIUM: 8.8 mg/dL (ref 8.6–10.4)
CHLORIDE: 107 mmol/L (ref 98–110)
CO2: 26 mmol/L (ref 20–31)
Creat: 1.16 mg/dL — ABNORMAL HIGH (ref 0.50–1.05)
Glucose, Bld: 89 mg/dL (ref 65–99)
POTASSIUM: 4.2 mmol/L (ref 3.5–5.3)
Sodium: 141 mmol/L (ref 135–146)
TOTAL PROTEIN: 6.8 g/dL (ref 6.1–8.1)
Total Bilirubin: 0.6 mg/dL (ref 0.2–1.2)

## 2016-06-16 LAB — POCT URINALYSIS DIPSTICK
Bilirubin, UA: NEGATIVE
Blood, UA: NEGATIVE
Glucose, UA: NEGATIVE
Ketones, UA: NEGATIVE
Nitrite, UA: NEGATIVE
Protein, UA: NEGATIVE
Spec Grav, UA: >=1.03
Urobilinogen, UA: NEGATIVE
pH, UA: 6

## 2016-06-16 LAB — LIPID PANEL
Cholesterol: 210 mg/dL — ABNORMAL HIGH (ref 125–200)
HDL: 50 mg/dL (ref >=46)
LDL Cholesterol: 140 mg/dL — ABNORMAL HIGH (ref <130)
Total CHOL/HDL Ratio: 4.2 Ratio (ref <=5.0)
Triglycerides: 101 mg/dL (ref <150)
VLDL: 20 mg/dL (ref <30)

## 2016-06-16 LAB — HEPATITIS C ANTIBODY: HCV Ab: NEGATIVE

## 2016-06-16 LAB — TSH: TSH: 1.79 mIU/L

## 2016-06-16 MED ORDER — ESCITALOPRAM OXALATE 10 MG PO TABS: 10.0000 mg | ORAL_TABLET | Freq: Every day | ORAL | Status: DC

## 2016-06-16 MED ORDER — DEXLANSOPRAZOLE 60 MG PO CPDR: 1.0000 | DELAYED_RELEASE_CAPSULE | Freq: Every day | ORAL | Status: DC

## 2016-06-16 MED ORDER — CYPROHEPTADINE HCL 4 MG PO TABS: 4.0000 mg | ORAL_TABLET | Freq: Every evening | ORAL | Status: DC | PRN

## 2016-06-16 NOTE — Patient Instructions (Signed)
  HEALTH MAINTENANCE RECOMMENDATIONS:  It is recommended that you get at least 30 minutes of aerobic exercise at least 5 days/week (for weight loss, you may need as much as 60-90 minutes). This can be any activity that gets your heart rate up. This can be divided in 10-15 minute intervals if needed, but try and build up your endurance at least once a week.  Weight bearing exercise is also recommended twice weekly.  Eat a healthy diet with lots of vegetables, fruits and fiber.  "Colorful" foods have a lot of vitamins (ie green vegetables, tomatoes, red peppers, etc).  Limit sweet tea, regular sodas and alcoholic beverages, all of which has a lot of calories and sugar.  Up to 1 alcoholic drink daily may be beneficial for women (unless trying to lose weight, watch sugars).  Drink a lot of water.  Calcium recommendations are 1200-1500 mg daily (1500 mg for postmenopausal women or women without ovaries), and vitamin D 1000 IU daily.  This should be obtained from diet and/or supplements (vitamins), and calcium should not be taken all at once, but in divided doses.  Monthly self breast exams and yearly mammograms for women over the age of 43 is recommended.  Sunscreen of at least SPF 30 should be used on all sun-exposed parts of the skin when outside between the hours of 10 am and 4 pm (not just when at beach or pool, but even with exercise, golf, tennis, and yard work!)  Use a sunscreen that says "broad spectrum" so it covers both UVA and UVB rays, and make sure to reapply every 1-2 hours.  Remember to change the batteries in your smoke detectors when changing your clock times in the spring and fall.  Use your seat belt every time you are in a car, and please drive safely and not be distracted with cell phones and texting while driving.   Yearly flu shots are encouraged. Increase your exercise (see above). If cholesterol is higher than last time, you should try changing to a vinaigrette dressing, and  limiting your cheese.

## 2016-06-16 NOTE — Progress Notes (Signed)
Chief Complaint  Patient presents with  . Annual Exam    sees Dr. Dellis Filbert for PAP. mammogram UTD per pt. fasting. pt has no concerns.    Laura Kirby is a 60 y.o. female who presents for a complete physical.  She has the following concerns:  She saw GI in December for rectal bleeding, due to anal fissure and internal hemorrhoid. Treated with diltiazem 2% ointment. She denies any recent problems or bleeding.  Hyperlipidemia follow-up: Patient is reportedly following a low-fat, low cholesterol diet. She is due for repeat lipids. She continues to eat lots of fruits and vegetables, tries to limit carbs and sugar. Doesn't eat red meat. Eating cheese more than as discussed last year, about once a week on salads. She has egg whites daily. She uses creamy salad dressings, 3-4 times/week. Lab Results  Component Value Date   CHOL 214* 06/13/2015   HDL 59 06/13/2015   LDLCALC 137* 06/13/2015   TRIG 90 06/13/2015   CHOLHDL 3.6 06/13/2015    Depression/anxiety: Very well controlled on her medication. She has not needed to use xanax in 2-3 years. Denies side effects from Lexapro.  GERD: Takes Dexilant daily during the week, with good control of symptoms. Doesn't take it on the weekends, and doesn't have any reflux. Does this to help make the pills last longer. Denies dysphagia. She admits she never tried tapering back further (had discussed trial of every other day last year).  Headaches: Only getting headaches infrequently, related to stress, and sometimes seasons. Relieved by Vanquish as needed, only needing a few times/year.  Insomnia: Takes periactin nightly to help with sleep. She doesn't have any significant allergy symptoms, just mild congestion sometimes in the morning. She will use an occasional claritin if needed for allergies (especially when very hot outside, cold in the [facility]).  Acne:  She stopped the accutane when she saw elevated TG on labs (which weren't fasting). She also  missed drinking her wine.  Took accutane for only a short time, stopped it May 30th.  She had been reassured that her TG were fine, being that they were nonfasting, but she didn't want to continue.  Immunization History  Administered Date(s) Administered  . Td 11/17/2005  . Tdap 10/18/2011   Refuses flu shots Last Pap smear: 07/2015 with Dr. Dellis Filbert  Last mammogram: 07/2015 Last colonoscopy: 05/2013, had tubular adenoma and diverticulosis; due again 05/2018 Last DEXA: never Dentist: twice a year Ophtho: yearly Exercise: Yoga once a week, stretches every morning. Weight-bearing exercise with 10# weight on the weekends.  Walking on treadmill 30 minutes twice a week (1.5 miles)  Past Medical History  Diagnosis Date  . GERD (gastroesophageal reflux disease)     EGD at [facility]  . Anxiety state, unspecified     on daily xanax for a couple of years in past  . Fibroid uterus     GYN--Dr. Dellis Filbert  . Ovarian cyst   . Sickle cell trait ([place])   . Hearing loss in left ear 2008    s/p surgery 12/2012, has persistent hearing loss  . Hyperlipidemia     diet controlled  . Depression   . Arthritis     L AC joint/shoulder  . Anemia     history     Past Surgical History  Procedure Laterality Date  . Myomectomy      Dr. Dellis Filbert - abdominal myomectomy  . Endometrial ablation    . Stapedes surgery Left 01/26/13    at [facility]; otosclerosis; persistent  L hearing loss after surgery  . Dilitation & currettage/hystroscopy with versapoint resection N/A 09/02/2013    Procedure: DILATATION & CURETTAGE/HYSTEROSCOPY WITH VERSAPOINT RESECTION;  Surgeon: Princess Bruins, MD;  Location: [place] [facility];  Service: Gynecology;  Laterality: N/A;  . Wisdom tooth extraction  07/2015  . Ptosis repair Bilateral 10/17/2015    Social History   Social History  . Marital Status: Married    Spouse Name: N/A  . Number of Children: 1  . Years of Education: N/A   Occupational History  . works from home.  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 Smoking status: Never Smoker   . Smokeless tobacco: Never Used  . Alcohol Use: 1.8 oz/week    3 Glasses of wine per week     Comment: wine/beer with dinner 2x/week (decreased from daily in attempts to lose weight)  . Drug Use: No  . Sexual Activity:    Partners: Male    Birth Control/ Protection: Post-menopausal   Other Topics Concern  . Not on file   Social History Narrative   Got Ph.D. In 06/2011.  Lives with her husband.  Daughter (adopted, now looking for apartment, working as a Surveyor, minerals and not living at home).   Works for [employer] for [employer], doing Oceanographer).  Still teaching online course.    Family History  Problem Relation Age of Onset  . Hyperlipidemia Mother   . Hypertension Mother   . Diabetes Mother   . Dementia Mother   . Irritable bowel syndrome Mother   . Stroke Mother 71  . Alcohol abuse Father   . Kidney disease Father   . Ulcers Father   . Allergies Daughter   . Eczema Daughter   . Cancer Maternal Aunt     breast cancer (60's)  . Cancer Maternal Uncle 70    colon  . Colon cancer Maternal Uncle 46  . Diabetes Maternal Grandfather   . Stroke Maternal Grandfather   . Heart disease Maternal Grandmother   . Cancer Paternal Aunt     ?unsure of type (stomach vs ovarian)    Outpatient Encounter Prescriptions as of 06/16/2016  Medication Sig Note  . BLACK COHOSH PO Take 1 tablet by mouth 2 (two) times daily.   . Calcium Carbonate-Vit D-Min (CALCIUM 1200 PO) Take 1 each by mouth daily.   . cyproheptadine (PERIACTIN) 4 MG tablet Take 1 tablet (4 mg total) by mouth at bedtime as needed. for sleep   . DEXILANT 60 MG capsule TAKE ONE CAPSULE BY MOUTH ONCE DAILY   . escitalopram (LEXAPRO) 10 MG tablet Take 1 tablet (10 mg total) by mouth daily.   . folic acid (FOLVITE) 621 MCG tablet Take 400 mcg by mouth daily.     Marland Kitchen glucosamine-chondroitin 500-400 MG  tablet Take 1 tablet by mouth 3 (three) times daily. 06/16/2016: Takes once daily  . Iron-Vitamins (GERITOL PO) Take 1 tablet by mouth.     . Magnesium 500 MG TABS Take 1 tablet by mouth daily.     . Probiotic Product (RESTORA) CAPS Take 1 capsule by mouth daily.     . Turmeric 500 MG TABS Take 500 mg by mouth.   . vitamin D, CHOLECALCIFEROL, 400 UNITS tablet Take 400 Units by mouth daily.   . vitamin E 100 UNIT capsule Take 100 Units by mouth daily.     Marland Kitchen zinc gluconate 50 MG tablet Take 50 mg by mouth daily.     Marland Kitchen  ASA-APAP-Caff Buffered (VANQUISH PO) Take 2 tablets by mouth as needed (migraine). Reported on 06/16/2016 06/13/2015: Uses prn--very infrequent (a few times/year)  . diltiazem 2 % GEL Apply 1 application topically 2 (two) times daily. (Patient not taking: Reported on 06/16/2016)   . [DISCONTINUED] diltiazem 2 % GEL Apply 1 application topically 3 (three) times daily. (Patient not taking: Reported on 06/16/2016)    No facility-administered encounter medications on file as of 06/16/2016.    Allergies  Allergen Reactions  . Amoxicillin-Pot Clavulanate Diarrhea and Nausea Only    ROS: The patient denies fever, vision changes, weight changes (slight loss), sore throat, breast concerns, chest pain, palpitations, dizziness, syncope, dyspnea on exertion, cough, swelling, nausea, vomiting, diarrhea, constipation, abdominal pain, melena, hematochezia, indigestion/heartburn, hematuria, incontinence, dysuria, vaginal bleeding, discharge, odor or itch, numbness, tingling, weakness, tremor, suspicious skin lesions.  No hemorrhoidal bleeding in the last few months.Denies constipation (had diarrhea recently) Mild L hearing loss, unchanged. She denies any urinary symptoms.  She had some urinary symptoms last week when she returned from [place] also had diarrhea.  These completely resolved. Intermittent left knee pain, mostly walking upstairs.  PHYSICAL EXAM:  BP 116/60 mmHg  Pulse 68   Ht 5' 6.25" (1.683 m)  Wt 164 lb 3.2 oz (74.481 kg)  BMI 26.30 kg/m2   General Appearance:  Alert, cooperative, no distress, appears stated age   Head:  Normocephalic, without obvious abnormality, atraumatic   Eyes:  PERRL, conjunctiva/corneas clear, EOM's intact, fundi  benign   Ears:  Normal TM's and external ear canals--very small canals. Scarring on L TM   Nose:  Nares normal, mucosa normal, no drainage or sinus tenderness   Throat:  Lips, mucosa, and tongue normal; teeth and gums normal   Neck:  Supple, no lymphadenopathy; thyroid: no enlargement/tenderness/nodules; no carotid  bruit or JVD   Back:  Spine nontender, no curvature, ROM normal, no CVA tenderness   Lungs:  Clear to auscultation bilaterally without wheezes, rales or ronchi; respirations unlabored   Chest Wall:  No tenderness or deformity   Heart:  Regular rate and rhythm, S1 and S2 normal, no murmur, rub  or gallop   Breast Exam:  Deferred to GYN   Abdomen:  Soft, non-tender, nondistended, normoactive bowel sounds,  no masses, no hepatosplenomegaly.  Genitalia:  Deferred to GYN      Extremities:  No clubbing, cyanosis or edema. 2+ PT and DP pulses   Pulses:  2+ and symmetric all extremities   Skin:  Skin color, texture, turgor normal, no rashes or lesions   Lymph nodes:  Cervical, supraclavicular, and axillary nodes normal   Neurologic:  CNII-XII intact, normal strength, sensation and gait; reflexes 2+ and symmetric throughout    Psych: Normal mood, affect, hygiene and grooming                ASSESSMENT/PLAN:  Annual physical exam - Plan: Urinalysis Dipstick, Comprehensive metabolic panel, Lipid panel, TSH  Pure hypercholesterolemia - recent elevated TG on accutane (nonfasting); some dietary changes. recheck lipids - Plan: Lipid panel  Depression, major, in remission ([place]) - Plan: escitalopram  (LEXAPRO) 10 MG tablet  Gastroesophageal reflux disease, esophagitis presence not specified - controlled with M-F use; tiral qod/taper - Plan: dexlansoprazole (DEXILANT) 60 MG capsule  Internal hemorrhoids - not currently symptomatic or bleeding; constipation resolved  Need for hepatitis C screening test - Plan: Hepatitis C antibody  Medication monitoring encounter - Plan: Comprehensive metabolic panel, Lipid panel  Anxiety state - improved/resolved -  Plan: escitalopram (LEXAPRO) 10 MG tablet  Insomnia - controlled with nightly periactin - Plan: cyproheptadine (PERIACTIN) 4 MG tablet  Asymptomatic bacteriuria - chronic.  c-met, lipids, TSH, hepatitis C Took accutane May 1st, off 5/30th wants LFT's checked  Reassured that EKG doesn't need to be repeated (showed incomplete BBB, no evidence of ischemia).  Discussed low cholesterol diet (if higher, needs to cut back on cheese and creamy dressings).  She had some concerns about her weight and trouble losing--reassured that she has lost. Cutting back on fats/calories in diet, and increasing exercise to the minimum recommendations for prevention of heart disease should also help.  Discussed monthly self breast exams and yearly mammograms; at least 30 minutes of aerobic activity at least 5 days/week, weight-bearing exercise at least 2x/week; proper sunscreen use reviewed; healthy diet, including goals of calcium and vitamin D intake and alcohol recommendations (less than or equal to 1 drink/day) reviewed; regular seatbelt use; changing batteries in smoke detectors. Immunization recommendations discussed--she declines flu shots, encouraged to get yearly. Zostavax at age 21. Colonoscopy recommendations reviewed--UTD, due again in 05/2018.  F/u 1 year, sooner prn.

## 2016-10-13 ENCOUNTER — Encounter: Payer: Self-pay | Admitting: Family Medicine

## 2016-10-13 ENCOUNTER — Ambulatory Visit (INDEPENDENT_AMBULATORY_CARE_PROVIDER_SITE_OTHER): Payer: 59 | Admitting: Family Medicine

## 2016-10-13 VITALS — BP 120/72 | HR 68 | Ht 66.25 in | Wt 162.0 lb

## 2016-10-13 DIAGNOSIS — R0789 Other chest pain: Secondary | ICD-10-CM | POA: Diagnosis not present

## 2016-10-13 DIAGNOSIS — K219 Gastro-esophageal reflux disease without esophagitis: Secondary | ICD-10-CM

## 2016-10-13 DIAGNOSIS — M546 Pain in thoracic spine: Secondary | ICD-10-CM

## 2016-10-13 DIAGNOSIS — R079 Chest pain, unspecified: Secondary | ICD-10-CM

## 2016-10-13 NOTE — Progress Notes (Signed)
Chief Complaint  Patient presents with  . Back Pain    mid pain pain that came on yesterday while shopping, at the same time had a pain her chest like indigestion-felt like if she could burp she would feel better. Had found out about an hour before that her uncle had passed (same way that her mother passed in the rom across the hall from where her mother had passed - mother's brother).   . Flu Vaccine    declined.    While shopping at [facility] in [place] yesterday, at 2pm, she developed an ache across her entire mid-back, along with discomfort between her breasts. It felt like indigestion/gas--felt like if she would burp she would feel better.  She was able to burp and got some improvement.  Pain lasted 30-45 seconds, and then she was fine. She felt a little hungry, ate some Cheetos. Took a Bayer aspirin when she got home.  Pain wasn't sharp, more of a "strong ache". Gets similar pain when laying back at the dentist, sometimes laying back in bed. Never had the anterior chest pain before.  She currently has residual dull ache across the mid back bilaterally; no further chest discomfort.  Diet yesterday:  She ate a late breakfast (11:30)--turkey bacon, egg while omelet with mushrooms, spinach; glass of cranberry juice (has daily). Had chick fil-A the day prior, doesn't usually eat fried foods.  Uncle fell, hit his head, had a brain bleed last weekend.  Learned of his passing at 1pm yesterday, when she left her house to go shopping. Discomfort at her shoulder blades on both sides, and at her right shoulder. She is concerned that this could be related to her heart.  She hadn't been doing her regular morning stretches over the last week.  She has h/o reflux, and takes Dexilant Monday through Friday. Doesn't take on the weekends, just when stressed at work.  PMH, PSH, SH and FH were updated and reviewed  Outpatient Encounter Prescriptions as of 10/13/2016  Medication Sig Note  . Apoaequorin  (PREVAGEN PO) Take 1 capsule by mouth daily.   Marland Kitchen BLACK COHOSH PO Take 1 tablet by mouth 2 (two) times daily.   . Calcium Carbonate-Vit D-Min (CALCIUM 1200 PO) Take 1 each by mouth daily.   . cyproheptadine (PERIACTIN) 4 MG tablet Take 1 tablet (4 mg total) by mouth at bedtime as needed. for sleep   . dexlansoprazole (DEXILANT) 60 MG capsule Take 1 capsule (60 mg total) by mouth daily.   Marland Kitchen escitalopram (LEXAPRO) 10 MG tablet Take 1 tablet (10 mg total) by mouth daily.   . folic acid (FOLVITE) A999333 MCG tablet Take 400 mcg by mouth daily.     Marland Kitchen glucosamine-chondroitin 500-400 MG tablet Take 1 tablet by mouth 3 (three) times daily. 06/16/2016: Takes once daily  . Iron-Vitamins (GERITOL PO) Take 1 tablet by mouth.     . Magnesium 500 MG TABS Take 1 tablet by mouth daily.     . Probiotic Product (RESTORA) CAPS Take 1 capsule by mouth daily.     . Turmeric 500 MG TABS Take 500 mg by mouth.   . vitamin D, CHOLECALCIFEROL, 400 UNITS tablet Take 400 Units by mouth daily.   . vitamin E 100 UNIT capsule Take 100 Units by mouth daily.     Marland Kitchen zinc gluconate 50 MG tablet Take 50 mg by mouth daily.     . ASA-APAP-Caff Buffered (VANQUISH PO) Take 2 tablets by mouth as needed (migraine). Reported on 06/16/2016  06/13/2015: Uses prn--very infrequent (a few times/year)  . diltiazem 2 % GEL Apply 1 application topically 2 (two) times daily. (Patient not taking: Reported on 10/13/2016)    No facility-administered encounter medications on file as of 10/13/2016.    Allergies  Allergen Reactions  . Amoxicillin-Pot Clavulanate Diarrhea and Nausea Only    ROS: Denies fever, chills, URI symptoms, cough, shortness of breath.  Slight congestion last week, took Airborne and resolved.  Chest pain and back pain per HPI (yesterday). Denies nausea, vomiting, no recent heartburn. No changes in bowels. Denies urinary complaints.  Denies bleeding, bruising, rashes. Denies dizziness, headaches (had one last week, sinus, related to  the weather).  PHYSICAL EXAM:  BP 120/72 (BP Location: Left Arm, Patient Position: Sitting, Cuff Size: Normal)   Pulse 68   Ht 5' 6.25" (1.683 m)   Wt 162 lb (73.5 kg)   BMI 25.95 kg/m    Well appearing female in no distress  Neck: no lymphadenopathy, thyromegaly or carotid bruit Heart: regular rate and rhythm without murmur Lungs: clear bilaterally Back: no spinal or CVA tenderness. She is mildly tender and somewhat tight at the left rhomboid muscle. Chest: tender at costochondral junctions bilaterally at one level at mid-breast level. nontender elsewhere.  No epigastric tenderness, but pressing on the epigastrium caused reflux symptoms up into her upper chest. No organomegaly or mass Extremities: no edema Psych: normal mood, affect, hygiene, grooming. Slightly anxious Neuro: alert and oriented, cranial nerves intact, normal strength, gait  EKG: sinus brady (rate 58), no acute abnormalities   ASSESSMENT/PLAN:  Chest pain, unspecified type - short-lived, with some reflux component. normal EKG. Also has MSK component at CC junction - Plan: EKG 12-Lead  Acute bilateral thoracic back pain - suspect muscular (rhomboid), poss degenerative/arthritis component. NSAID, heat, stretches, massage - Plan: EKG 12-Lead  Chest wall pain - costochondritis. Heat, NSAIDs - Plan: EKG 12-Lead  Gastroesophageal reflux disease, esophagitis presence not specified - continue Dexilant, use daily especially while taking NSAID   Chest pain--suspect MSK and reflux etiologies given exam and history. Will check EKG to assure patient (she requested).  Back pain--suspect MSK (rhomboid), cannot r/o arthritic component (DDD thoracic).  Continue her natural anti-inflammatories (ie turmeric).  I recommend use of heat, stretch and massage to the muscles at the shoulderblades (rhomboid muscles). Moist heat to the chest will also help. Consider taking aleve twice daily or some ibuprofen to help with the  inflammation (you shouldn't need this longer than a week). Resume your normal stretching. Reflux also appears to be a component of your discomfort.  Take the Dexilant daily until you are back to your normal self (and not taking anti-inflammatories).  Contact us if symptoms persist or worsen.  Consider CXR if ongoing problems.

## 2016-10-13 NOTE — Patient Instructions (Signed)
  I recommend use of heat, stretch and massage to the muscles at the shoulderblades (rhomboid muscles). Moist heat to the chest will also help. Consider taking aleve twice daily or some ibuprofen to help with the inflammation (you shouldn't need this longer than a week). Resume your normal stretching. Reflux also appears to be a component of your discomfort.  Take the Dexilant daily until you are back to your normal self (and not taking anti-inflammatories).  Contact us if symptoms persist or worsen.

## 2016-11-11 ENCOUNTER — Encounter: Payer: Self-pay | Admitting: Medical

## 2016-11-11 ENCOUNTER — Ambulatory Visit (INDEPENDENT_AMBULATORY_CARE_PROVIDER_SITE_OTHER): Payer: 59 | Admitting: Medical

## 2016-11-11 VITALS — BP 110/58 | HR 72 | Wt 162.4 lb

## 2016-11-11 DIAGNOSIS — R194 Change in bowel habit: Secondary | ICD-10-CM

## 2016-11-11 DIAGNOSIS — R195 Other fecal abnormalities: Secondary | ICD-10-CM | POA: Diagnosis not present

## 2016-11-11 DIAGNOSIS — A09 Infectious gastroenteritis and colitis, unspecified: Secondary | ICD-10-CM | POA: Diagnosis not present

## 2016-11-11 MED ORDER — CIPROFLOXACIN HCL 500 MG PO TABS: 500.0000 mg | ORAL_TABLET | Freq: Two times a day (BID) | ORAL | 0 refills | Status: DC

## 2016-11-11 NOTE — Progress Notes (Signed)
Subjective: Chief Complaint  Patient presents with  . diarrhea     diarrhea x2 week    Here for diarrhea.  She notes for about 2 weeks, been having "diarrhea."  In the past 2 weeks, some mornings has one big somewhat formed stool, then loose stool in the same BM. Usually having 2 BMs daily, and this past 2 weeks has continued 2 BMs daily, but more loose in general.  2 days ago Sunday had no BM at all.  Maybe having some abdominal discomfort from time to time, but no major abdominal pain, ongoing cramping.  No nausea other than when she took the pepto or imodium. Yesterday had solid stool then loose first thing in the morning.   After lunch felt achy, then took some imodium, stomach started cramping and hurting.   Tried to have BM, and it was mostly solid, some loose.  pepto and imodium both made her feel nauseated and crampy, seemed to worsen symptoms.  No sick contacts with similar symptom.  She notes going to [place] the week before the bowel changes started.  Normally she eats a lot of raw fruits, vegetables and fiber, but the week she went to [place] was eating a lot of sweets, spicy food and soda.   This was out of her norm.  Sunday though given the new diarrhea, she cut out raw fruits/elementals temporarily.  Last colonoscopy 2014, Dr. Fuller Plan, had 2 polys, due again 2019.     No blood in stool.  No other aggravating or relieving factors. No other complaint.  Past Medical History:  Diagnosis Date  . Anemia    history   . Anxiety state, unspecified    on daily xanax for a couple of years in past  . Arthritis    L AC joint/shoulder  . Depression   . Fibroid uterus    GYN--Dr. Dellis Filbert  . GERD (gastroesophageal reflux disease)    EGD at [facility]  . 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 Hyperlipidemia    diet controlled  . Ovarian cyst   . Sickle cell trait [facility])    Current Outpatient Prescriptions on File Prior to Visit  Medication Sig  Dispense Refill  . Apoaequorin (PREVAGEN PO) Take 1 capsule by mouth daily.    . ASA-APAP-Caff Buffered (VANQUISH PO) Take 2 tablets by mouth as needed (migraine). Reported on 06/16/2016    . BLACK COHOSH PO Take 1 tablet by mouth 2 (two) times daily.    . Calcium Carbonate-Vit D-Min (CALCIUM 1200 PO) Take 1 each by mouth daily.    . cyproheptadine (PERIACTIN) 4 MG tablet Take 1 tablet (4 mg total) by mouth at bedtime as needed. for sleep 30 tablet 11  . dexlansoprazole (DEXILANT) 60 MG capsule Take 1 capsule (60 mg total) by mouth daily. 30 capsule 11  . escitalopram (LEXAPRO) 10 MG tablet Take 1 tablet (10 mg total) by mouth daily. 30 tablet 11  . folic acid (FOLVITE) A999333 MCG tablet Take 400 mcg by mouth daily.      Marland Kitchen glucosamine-chondroitin 500-400 MG tablet Take 1 tablet by mouth 3 (three) times daily.    . Iron-Vitamins (GERITOL PO) Take 1 tablet by mouth.      . Magnesium 500 MG TABS Take 1 tablet by mouth daily.      . Probiotic Product (RESTORA) CAPS Take 1 capsule by mouth daily.      . Turmeric 500  MG TABS Take 500 mg by mouth.    . vitamin D, CHOLECALCIFEROL, 400 UNITS tablet Take 400 Units by mouth daily.    . vitamin E 100 UNIT capsule Take 100 Units by mouth daily.      Marland Kitchen zinc gluconate 50 MG tablet Take 50 mg by mouth daily.       No current facility-administered medications on file prior to visit.    Past Surgical History:  Procedure Laterality Date  . DILITATION & CURRETTAGE/HYSTROSCOPY WITH VERSAPOINT RESECTION N/A 09/02/2013   Procedure: DILATATION & CURETTAGE/HYSTEROSCOPY WITH VERSAPOINT RESECTION;  Surgeon: Princess Bruins, MD;  Location: [place] [facility];  Service: Gynecology;  Laterality: N/A;  . ENDOMETRIAL ABLATION    . MYOMECTOMY     Dr. Dellis Filbert - abdominal myomectomy  . PTOSIS REPAIR Bilateral 10/17/2015  . STAPEDES SURGERY Left 01/26/13   at [facility]; otosclerosis; persistent L hearing loss after surgery  . WISDOM TOOTH EXTRACTION  07/2015   ROS as in  subjective   Objective: BP (!) 110/58   Pulse 72   Wt 162 lb 6.4 oz (73.7 kg)   SpO2 97%   BMI 26.01 kg/m   General appearance: alert, no distress, WD/WN HEENT: normocephalic, sclerae anicteric, TMs pearly, nares patent, no discharge or erythema, pharynx normal Oral cavity: MMM, no lesions Neck: supple, no lymphadenopathy, no thyromegaly, no masses Heart: RRR, normal S1, S2, no murmurs Lungs: CTA bilaterally, no wheezes, rhonchi, or rales Abdomen: +bs, soft, non tender, non distended, no masses, no hepatomegaly, no splenomegaly Pulses: 2+ symmetric, upper and lower extremities, normal cap refill Ext: no edema   Assessment: Encounter Diagnoses  Name Primary?  . Bowel habit changes Yes  . Loose stools   . Traveler's diarrhea     Plan: Discussed causes of diarrhea/loose stool.  She does have diverticulosis on 2014 colonoscopy but no hx/o diverticulitis, and no signs of that today.   Doubt stomach flu/virus, food poisoning, c diff (no recent antibiotic use or exposure to hospital setting in last several moths).  She did recently have shift in her diet the week before symptoms started, and loose stools have been 1-2 daily, partly formed, not just watery.   No blood.    Gave options for doing stool studies and she declines.  We will try Cipro empirically for traveler's diarrhea, advised she avoid spicy foods, acidic foods, coffee, or other potential triggers of loose stool.     If worse or not improving by end of the week, then recheck.

## 2017-06-20 NOTE — Progress Notes (Signed)
Chief Complaint  Patient presents with  . Annual Exam    fasting annual exam no pap, sees GYN. Will schedule with Dr Macarthur Critchley. Only concern is urinary frequency and burning. UA showed 3+ leuks and 1+ blood. No other concerns.     Laura Kirby is a 61 y.o. female who presents for a complete physical.  She has the following concerns:  Urinary frequency, urgency and burning x 2 days.  Hasn't had incontinence, but has come close. Denies odor or hematuria.  She has a slight vaginal discharge, no odor or itch.  No fever, chills or flank pain.  Hyperlipidemia follow-up: Patient is reportedly following a low-fat, low cholesterol diet. Her cholesterol has been controlled by diet. She continues to eat lots of fruits and vegetables, tries to limit carbs and sugar. Doesn't eat red meat. Eating cheese, about once a week on salads. She eats a lot of oatmeal, egg white omelets on the weekends (or sauteed swiss chard). She uses creamy salad dressings, 3-4 times/week. Lab Results  Component Value Date   CHOL 210 (H) 06/16/2016   HDL 50 06/16/2016   LDLCALC 140 (H) 06/16/2016   TRIG 101 06/16/2016   CHOLHDL 4.2 06/16/2016    Depression/anxiety: Very well controlled on her medication. She has not needed to use xanax in years. Denies side effects from Lexapro. Worried today about her daughter, who called 911 recently for SI and is in the hospital today.  GERD: Takes Dexilant daily during the week, with good control of symptoms. Doesn't take it on the weekends, and doesn't have any reflux. Does this to help make the pills last longer. Denies dysphagia.  She had episode of chest pain in October--felt to be related to costochondritis, muscular and also reflux contributing.  EKG was normal. She continues on her M-F Dexilant, none on weekends. No problems with reflux on the weekends.  Headaches: Continues to only get headaches infrequently, related to stress, and sometimes seasons. Relieved by  Vanquish as needed, only needing a few times/year. She had a headache for 3 days last week, for the first time this year, relieved by Vanquish. No problems since restarting Claritin.  Insomnia: Takes periactin nightly to help with sleep. She has some seasonal allergies, which only started flaring a few weeks ago.  Claritin is effective in treating her symptoms.  Sees dermatologist at [facility] for alopecia. She has been getting steroid injections in her scalp, and was started on finasteride.   Immunization History  Administered Date(s) Administered  . Td 11/17/2005  . Tdap 10/18/2011   Refuses flu shots Last Pap smear: 07/2016 with Dr. Dellis Filbert, per pt Last mammogram: reports having one last year with Dr. Dellis Filbert at her office Last colonoscopy: 05/2013, had tubular adenoma and diverticulosis; due again 05/2018 Last DEXA: never Dentist: twice a year Ophtho: yearly Exercise: Yoga once a week, stretches, squats and leg lifts every morning (15 minutes). Weight-bearing exercise with 10# weight on the weekends.   Exercise bike 15 minutes once/week, and elliptical 15 minutes once/week (goes to the gym once/week, sometimes twice.  There for an hour, the rest is weights).  Past Medical History:  Diagnosis Date  . Anemia    history   . Anxiety state, unspecified    on daily xanax for a couple of years in past  . Arthritis    L AC joint/shoulder  . Depression   . Fibroid uterus    GYN--Dr. Dellis Filbert  . GERD (gastroesophageal reflux disease)    EGD  at [facility]  . 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 Hyperlipidemia    diet controlled  . Ovarian cyst   . Sickle cell trait [facility])     Past Surgical History:  Procedure Laterality Date  . DILITATION & CURRETTAGE/HYSTROSCOPY WITH VERSAPOINT RESECTION N/A 09/02/2013   Procedure: DILATATION & CURETTAGE/HYSTEROSCOPY WITH VERSAPOINT RESECTION;  Surgeon: Princess Bruins, MD;  Location: [facility];   Service: Gynecology;  Laterality: N/A;  . ENDOMETRIAL ABLATION    . MYOMECTOMY     Dr. Dellis Filbert - abdominal myomectomy  . PTOSIS REPAIR Bilateral 10/17/2015  . STAPEDES SURGERY Left 01/26/13   at [facility]; otosclerosis; persistent L hearing loss after surgery  . WISDOM TOOTH EXTRACTION  07/2015    Social History   Social History  . Marital status: Married    Spouse name: N/A  . Number of children: 1  . Years of education: N/A   Occupational History  . works from home.  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 Smoking status: Never Smoker  . Smokeless tobacco: Never Used  . Alcohol use 3.0 oz/week    5 Glasses of wine per week     Comment: wine/beer with dinner most days  . Drug use: No  . Sexual activity: Yes    Partners: Male    Birth control/ protection: Post-menopausal   Other Topics Concern  . Not on file   Social History Narrative   Got Ph.D. In 06/2011.  Lives with her husband.  Daughter lives in [place]. Works for [employer] for [employer], doing Oceanographer).  Still teaching online course.    Family History  Problem Relation Age of Onset  . Hyperlipidemia Mother   . Hypertension Mother   . Diabetes Mother   . Dementia Mother   . Irritable bowel syndrome Mother   . Stroke Mother 63  . Alcohol abuse Father   . Kidney disease Father   . Ulcers Father   . Allergies Daughter   . Eczema Daughter   . Cancer Maternal Aunt        breast cancer (60's)  . Cancer Maternal Uncle 70       colon  . Colon cancer Maternal Uncle 74  . Diabetes Maternal Grandfather   . Stroke Maternal Grandfather   . Heart disease Maternal Grandmother   . Cancer Paternal Aunt        ?unsure of type (stomach vs ovarian)    Outpatient Encounter Prescriptions as of 06/22/2017  Medication Sig Note  . Apoaequorin (PREVAGEN PO) Take 1 capsule by mouth daily.   . ASA-APAP-Caff Buffered (VANQUISH PO) Take 2 tablets  by mouth as needed (migraine). Reported on 06/16/2016 06/13/2015: Uses prn--very infrequent (a few times/year)  . Biotin 5000 MCG CAPS Take 1 capsule by mouth daily.   . Calcium Carbonate-Vit D-Min (CALCIUM 1200 PO) Take 1 each by mouth daily.   . cyproheptadine (PERIACTIN) 4 MG tablet Take 1 tablet (4 mg total) by mouth at bedtime as needed. for sleep   . dexlansoprazole (DEXILANT) 60 MG capsule Take 1 capsule (60 mg total) by mouth daily. 06/22/2017: Takes Monday through Friday  . escitalopram (LEXAPRO) 10 MG tablet Take 1 tablet (10 mg total) by mouth daily.   . finasteride (PROSCAR) 5 MG tablet Take 2.5 mg by mouth.   . folic acid (FOLVITE) 697 MCG tablet Take 400 mcg by mouth daily.     Marland Kitchen  glucosamine-chondroitin 500-400 MG tablet Take 1 tablet by mouth 3 (three) times daily. 06/16/2016: Takes once daily  . Iron-Vitamins (GERITOL PO) Take 1 tablet by mouth.     . loratadine (CLARITIN) 10 MG tablet Take 10 mg by mouth daily.   . Magnesium 500 MG TABS Take 1 tablet by mouth daily.     . Probiotic Product (RESTORA) CAPS Take 1 capsule by mouth daily.     . Turmeric 500 MG TABS Take 500 mg by mouth.   . vitamin D, CHOLECALCIFEROL, 400 UNITS tablet Take 400 Units by mouth daily.   . vitamin E 100 UNIT capsule Take 100 Units by mouth daily.     Marland Kitchen zinc gluconate 50 MG tablet Take 50 mg by mouth daily.     . [DISCONTINUED] BLACK COHOSH PO Take 1 tablet by mouth 2 (two) times daily.   . [DISCONTINUED] ciprofloxacin (CIPRO) 500 MG tablet Take 1 tablet (500 mg total) by mouth 2 (two) times daily.    No facility-administered encounter medications on file as of 06/22/2017.     Allergies  Allergen Reactions  . Amoxicillin-Pot Clavulanate Diarrhea and Nausea Only    ROS: The patient denies fever, vision changes, weight changes, sore throat, breast concerns, chest pain (episode in October, per HPI), palpitations, dizziness, syncope, dyspnea on exertion, cough, swelling, nausea, vomiting, diarrhea,  constipation, abdominal pain, melena, hematochezia, indigestion/heartburn, vaginal bleeding, discharge, odor or itch, numbness, tingling, weakness, tremor, suspicious skin lesions.  No recent hemorrhoidal bleeding. Mild L hearing loss, unchanged. 2 days of urinary symptoms per HPI, with some associated intermittent nausea. Moods are good, insomnia controlled with medication.    PHYSICAL EXAM:  BP 122/84 (BP Location: Left Arm, Patient Position: Sitting, Cuff Size: Normal)   Pulse 80   Temp 98 F (36.7 C) (Tympanic)   Ht 5' 6"  (1.676 m)   Wt 163 lb 3.2 oz (74 kg)   BMI 26.34 kg/m   124/78 on repeat by MD (when speaking of anxiety regarding her daughter).  Wt Readings from Last 3 Encounters:  06/22/17 163 lb 3.2 oz (74 kg)  11/11/16 162 lb 6.4 oz (73.7 kg)  10/13/16 162 lb (73.5 kg)    General Appearance:  Alert, cooperative, no distress, appears stated age   Head:  Normocephalic, without obvious abnormality, atraumatic   Eyes:  PERRL, conjunctiva/corneas clear, EOM's intact, fundi benign   Ears:  Normal TM's and external ear canals--very small canals. Scarring on L TM   Nose:  Nares normal, mucosa normal, no drainage or sinus tenderness   Throat:  Lips, mucosa, and tongue normal; teeth and gums normal   Neck:  Supple, no lymphadenopathy; thyroid: no enlargement/tenderness/nodules; no carotid bruit or JVD   Back:  Spine nontender, no curvature, ROM normal, no CVA tenderness   Lungs:  Clear to auscultation bilaterally without wheezes, rales or ronchi; respirations unlabored   Chest Wall:  No tenderness or deformity   Heart:  Regular rate and rhythm, S1 and S2 normal, no murmur, rub or gallop   Breast Exam:  Deferred to GYN   Abdomen:  Soft, nondistended, normoactive bowel sounds, no masses, no hepatosplenomegaly.mildly tender in suprapubic area  Genitalia:  Deferred to GYN      Extremities:  No clubbing, cyanosis or  edema. 2+ PT and DP pulses   Pulses:  2+ and symmetric all extremities   Skin:  Skin color, texture, turgor normal, no rashes or lesions   Lymph nodes:  Cervical, supraclavicular, and axillary nodes normal  Neurologic:  CNII-XII intact, normal strength, sensation and gait; reflexes 2+ and symmetric throughout     Psych:  Normal mood, affect, hygiene and grooming  Urine dip 3+ leuks   ASSESSMENT/PLAN:  Annual physical exam - Plan: POCT Urinalysis Dipstick, CBC with Differential/Platelet, Comprehensive metabolic panel, Lipid panel, TSH  Depression, major, in remission ([place]) - Plan: escitalopram (LEXAPRO) 10 MG tablet  Gastroesophageal reflux disease, esophagitis presence not specified - controlled on Dexilant - Plan: dexlansoprazole (DEXILANT) 60 MG capsule  Insomnia, unspecified type - controlled with periactin - Plan: cyproheptadine (PERIACTIN) 4 MG tablet  Pure hypercholesterolemia - recheck today; reviewed low cholesterol diet  Anxiety state - Plan: escitalopram (LEXAPRO) 10 MG tablet  Medication monitoring encounter - Plan: CBC with Differential/Platelet, Comprehensive metabolic panel  Acute cystitis without hematuria - Plan: Urine Culture, sulfamethoxazole-trimethoprim (BACTRIM DS,SEPTRA DS) 800-160 MG tablet   CBC, c-met, lipids, TSH Urine culture   Discussed monthly self breast exams and yearly mammograms; at least 30 minutes of aerobic activity at least 5 days/week, weight-bearing exercise at least 2x/week; proper sunscreen use reviewed; healthy diet, including goals of calcium and vitamin D intake and alcohol recommendations (less than or equal to 1 drink/day) reviewed; regular seatbelt use; changing batteries in smoke detectors. Immunization recommendations discussed--she declines flu shots, encouraged to get yearly.Shingrix recommended. Colonoscopy recommendations reviewed--UTD, due again in 05/2018.  F/u 1 year, sooner prn.

## 2017-06-22 ENCOUNTER — Ambulatory Visit (INDEPENDENT_AMBULATORY_CARE_PROVIDER_SITE_OTHER): Payer: 59 | Admitting: Family Medicine

## 2017-06-22 ENCOUNTER — Encounter: Payer: Self-pay | Admitting: Family Medicine

## 2017-06-22 VITALS — BP 122/84 | HR 80 | Temp 98.0°F | Ht 66.0 in | Wt 163.2 lb

## 2017-06-22 DIAGNOSIS — N3000 Acute cystitis without hematuria: Secondary | ICD-10-CM

## 2017-06-22 DIAGNOSIS — K219 Gastro-esophageal reflux disease without esophagitis: Secondary | ICD-10-CM | POA: Diagnosis not present

## 2017-06-22 DIAGNOSIS — G4700 Insomnia, unspecified: Secondary | ICD-10-CM

## 2017-06-22 DIAGNOSIS — F325 Major depressive disorder, single episode, in full remission: Secondary | ICD-10-CM | POA: Diagnosis not present

## 2017-06-22 DIAGNOSIS — E7800 Pure hypercholesterolemia, unspecified: Secondary | ICD-10-CM

## 2017-06-22 DIAGNOSIS — Z0000 Encounter for general adult medical examination without abnormal findings: Secondary | ICD-10-CM

## 2017-06-22 DIAGNOSIS — Z5181 Encounter for therapeutic drug level monitoring: Secondary | ICD-10-CM | POA: Diagnosis not present

## 2017-06-22 DIAGNOSIS — F411 Generalized anxiety disorder: Secondary | ICD-10-CM

## 2017-06-22 LAB — COMPREHENSIVE METABOLIC PANEL
ALK PHOS: 60 U/L (ref 33–130)
ALT: 15 U/L (ref 6–29)
AST: 22 U/L (ref 10–35)
Albumin: 4.2 g/dL (ref 3.6–5.1)
BUN: 17 mg/dL (ref 7–25)
CALCIUM: 9.5 mg/dL (ref 8.6–10.4)
CO2: 24 mmol/L (ref 20–31)
Chloride: 104 mmol/L (ref 98–110)
Creat: 1.3 mg/dL — ABNORMAL HIGH (ref 0.50–1.05)
Glucose, Bld: 81 mg/dL (ref 65–99)
Potassium: 4.4 mmol/L (ref 3.5–5.3)
Sodium: 140 mmol/L (ref 135–146)
TOTAL PROTEIN: 7.3 g/dL (ref 6.1–8.1)
Total Bilirubin: 0.8 mg/dL (ref 0.2–1.2)

## 2017-06-22 LAB — POCT URINALYSIS DIPSTICK
Bilirubin, UA: NEGATIVE
Glucose, UA: NEGATIVE
Ketones, UA: NEGATIVE
Nitrite, UA: NEGATIVE
PH UA: 6 (ref 5.0–8.0)
PROTEIN UA: NEGATIVE
SPEC GRAV UA: 1.02 (ref 1.010–1.025)
UROBILINOGEN UA: NEGATIVE U/dL — AB

## 2017-06-22 LAB — CBC WITH DIFFERENTIAL/PLATELET
BASOS PCT: 0 %
Basophils Absolute: 0 cells/uL (ref 0–200)
EOS ABS: 83 {cells}/uL (ref 15–500)
Eosinophils Relative: 1 %
HEMATOCRIT: 43.6 % (ref 35.0–45.0)
Hemoglobin: 14.7 g/dL (ref 11.7–15.5)
LYMPHS PCT: 14 %
Lymphs Abs: 1162 cells/uL (ref 850–3900)
MCH: 30.1 pg (ref 27.0–33.0)
MCHC: 33.7 g/dL (ref 32.0–36.0)
MCV: 89.2 fL (ref 80.0–100.0)
MPV: 9.9 fL (ref 7.5–12.5)
Monocytes Absolute: 581 cells/uL (ref 200–950)
Monocytes Relative: 7 %
NEUTROS ABS: 6474 {cells}/uL (ref 1500–7800)
Neutrophils Relative %: 78 %
Platelets: 234 10*3/uL (ref 140–400)
RBC: 4.89 MIL/uL (ref 3.80–5.10)
RDW: 14.4 % (ref 11.0–15.0)
WBC: 8.3 10*3/uL (ref 4.0–10.5)

## 2017-06-22 LAB — LIPID PANEL
CHOL/HDL RATIO: 3.6 ratio (ref <5.0)
CHOLESTEROL: 210 mg/dL — AB (ref <200)
HDL: 59 mg/dL (ref >50)
LDL Cholesterol: 133 mg/dL — ABNORMAL HIGH (ref <100)
Triglycerides: 88 mg/dL (ref <150)
VLDL: 18 mg/dL (ref <30)

## 2017-06-22 LAB — TSH: TSH: 1.37 mIU/L

## 2017-06-22 MED ORDER — CYPROHEPTADINE HCL 4 MG PO TABS: 4.0000 mg | ORAL_TABLET | Freq: Every evening | ORAL | 3 refills | Status: DC | PRN

## 2017-06-22 MED ORDER — SULFAMETHOXAZOLE-TRIMETHOPRIM 800-160 MG PO TABS: 1.0000 | ORAL_TABLET | Freq: Two times a day (BID) | ORAL | 0 refills | Status: DC

## 2017-06-22 MED ORDER — DEXLANSOPRAZOLE 60 MG PO CPDR: 1.0000 | DELAYED_RELEASE_CAPSULE | Freq: Every day | ORAL | 3 refills | Status: DC

## 2017-06-22 MED ORDER — ESCITALOPRAM OXALATE 10 MG PO TABS: 10.0000 mg | ORAL_TABLET | Freq: Every day | ORAL | 3 refills | Status: DC

## 2017-06-22 NOTE — Patient Instructions (Addendum)
  HEALTH MAINTENANCE RECOMMENDATIONS:  It is recommended that you get at least 30 minutes of aerobic exercise at least 5 days/week (for weight loss, you may need as much as 60-90 minutes). This can be any activity that gets your heart rate up. This can be divided in 10-15 minute intervals if needed, but try and build up your endurance at least once a week.  Weight bearing exercise is also recommended twice weekly.  Eat a healthy diet with lots of vegetables, fruits and fiber.  "Colorful" foods have a lot of vitamins (ie green vegetables, tomatoes, red peppers, etc).  Limit sweet tea, regular sodas and alcoholic beverages, all of which has a lot of calories and sugar.  Up to 1 alcoholic drink daily may be beneficial for women (unless trying to lose weight, watch sugars).  Drink a lot of water.  Calcium recommendations are 1200-1500 mg daily (1500 mg for postmenopausal women or women without ovaries), and vitamin D 1000 IU daily.  This should be obtained from diet and/or supplements (vitamins), and calcium should not be taken all at once, but in divided doses.  Monthly self breast exams and yearly mammograms for women over the age of 38 is recommended.  Sunscreen of at least SPF 30 should be used on all sun-exposed parts of the skin when outside between the hours of 10 am and 4 pm (not just when at beach or pool, but even with exercise, golf, tennis, and yard work!)  Use a sunscreen that says "broad spectrum" so it covers both UVA and UVB rays, and make sure to reapply every 1-2 hours.  Remember to change the batteries in your smoke detectors when changing your clock times in the spring and fall.  Use your seat belt every time you are in a car, and please drive safely and not be distracted with cell phones and texting while driving.  I recommend getting the new shingles vaccine (Shingrix). You will need to check with your insurance to see if it is covered.  It is a series of 2 injections, spaced 2  months apart.  Contact us if your symptoms aren't improving after 2 days of antibiotics. We will let you know based on culture results if a change in antibiotic is needed.

## 2017-06-24 LAB — URINE CULTURE

## 2017-08-19 ENCOUNTER — Other Ambulatory Visit: Payer: Self-pay | Admitting: Family Medicine

## 2017-08-19 DIAGNOSIS — N3000 Acute cystitis without hematuria: Secondary | ICD-10-CM

## 2017-08-19 NOTE — Telephone Encounter (Signed)
Left message for patient and refused medication.

## 2017-08-19 NOTE — Telephone Encounter (Signed)
Called patient to see if she requested this refill and what was going on. She had it filled in June at visit. States that she has frequent urination and urgency and it is related to sexual intercourse. I told her she may need an appt-she wanted me to ask you if you could please call in she would be most appreciative.

## 2017-08-19 NOTE — Telephone Encounter (Signed)
She had UTI on last visit.  If she is having recurrent symptoms, needs OV, can see her tomorrow. She can use AZO in interim for relief of discomfort.  I don't like to refill the same antibiotic over and over, as it may cause resistance.  Need to do proper eval and determine which antibiotic, if any, is appropriate (and collect another culture so that if not getting better, we will know which antibiotic to change to).

## 2017-08-26 ENCOUNTER — Telehealth: Payer: Self-pay | Admitting: *Deleted

## 2017-08-26 LAB — HM MAMMOGRAPHY

## 2017-08-26 NOTE — Telephone Encounter (Signed)
Left message on patient's vm letting her know that Dr.Knapp received her UC records and that I was checking to see if her sx's were better and that it is likely that she should schedule a f/u with Dr. Tomi Bamberger to have her UA checked to ensure clearance. And also if she has persistent hematuria she may need a referral to urology.

## 2017-09-24 ENCOUNTER — Ambulatory Visit (INDEPENDENT_AMBULATORY_CARE_PROVIDER_SITE_OTHER): Payer: 59 | Admitting: Family Medicine

## 2017-09-24 ENCOUNTER — Encounter: Payer: Self-pay | Admitting: Family Medicine

## 2017-09-24 VITALS — BP 100/60 | HR 76 | Temp 97.9°F | Ht 66.0 in | Wt 168.4 lb

## 2017-09-24 DIAGNOSIS — R229 Localized swelling, mass and lump, unspecified: Secondary | ICD-10-CM

## 2017-09-24 NOTE — Progress Notes (Signed)
Chief Complaint  Patient presents with  . Cyst    on upper right thigh area x 1 week. Not bothersome in any way but would like to know what it is.   . Flu Vaccine    patient declined.     She noticed a lump at the front of her left hip earlier this week.  Felt it when laying in bed. She noticed it by brushing her hand over it--denies any pain, redness.  Not aware if it could have been present for longer than this, since not really causing symptoms. No change in size.  Could have bumped into the corner of a TV stand, but never sore or bruised--she does recall bumping into this stand (more than once).  PMH, PSH, SH reviewed  Outpatient Encounter Prescriptions as of 09/24/2017  Medication Sig Note  . Apoaequorin (PREVAGEN PO) Take 1 capsule by mouth daily.   . ASA-APAP-Caff Buffered (VANQUISH PO) Take 2 tablets by mouth as needed (migraine). Reported on 06/16/2016 06/13/2015: Uses prn--very infrequent (a few times/year)  . Biotin 5000 MCG CAPS Take 1 capsule by mouth daily.   . Calcium Carbonate-Vit D-Min (CALCIUM 1200 PO) Take 1 each by mouth daily.   . cyproheptadine (PERIACTIN) 4 MG tablet Take 1 tablet (4 mg total) by mouth at bedtime as needed. for sleep   . dexlansoprazole (DEXILANT) 60 MG capsule Take 1 capsule (60 mg total) by mouth daily.   Marland Kitchen escitalopram (LEXAPRO) 10 MG tablet Take 1 tablet (10 mg total) by mouth daily.   . finasteride (PROSCAR) 5 MG tablet Take 2.5 mg by mouth.   . folic acid (FOLVITE) 016 MCG tablet Take 400 mcg by mouth daily.     . Iron-Vitamins (GERITOL PO) Take 1 tablet by mouth.     . loratadine (CLARITIN) 10 MG tablet Take 10 mg by mouth daily.   . Magnesium 500 MG TABS Take 1 tablet by mouth daily.     . Probiotic Product (RESTORA) CAPS Take 1 capsule by mouth daily.     . Turmeric 500 MG TABS Take 500 mg by mouth.   . vitamin D, CHOLECALCIFEROL, 400 UNITS tablet Take 400 Units by mouth daily.   . vitamin E 100 UNIT capsule Take 100 Units by mouth daily.      Marland Kitchen zinc gluconate 50 MG tablet Take 50 mg by mouth daily.     Marland Kitchen glucosamine-chondroitin 500-400 MG tablet Take 1 tablet by mouth 3 (three) times daily. 06/16/2016: Takes once daily  . [DISCONTINUED] sulfamethoxazole-trimethoprim (BACTRIM DS,SEPTRA DS) 800-160 MG tablet Take 1 tablet by mouth 2 (two) times daily.    No facility-administered encounter medications on file as of 09/24/2017.    Allergies  Allergen Reactions  . Amoxicillin-Pot Clavulanate Diarrhea and Nausea Only   ROS: no fever, chills, URI symptoms, rash, chest pain, shortness of breath, joint pains or other concerns  PHYSICAL EXAM:  BP 100/60 (BP Location: Left Arm, Patient Position: Sitting, Cuff Size: Normal)   Pulse 76   Temp 97.9 F (36.6 C) (Tympanic)   Ht 5\' 6"  (1.676 m)   Wt 168 lb 6.4 oz (76.4 kg)   BMI 27.18 kg/m   Well appearing, pleasant female in no distress Right upper thigh, laterally is the area of concerns.  Within this area, there is a very small, 1/2 cm firm area, superficial and nontender. Vague larger area of about 3-4 cm surrounding this--margins are not discrete, but does feel slightly prominent. The overlying skin is completely normal--no  erythema, warmth, pore or other abnormality noted. There are some very faint superficial veins, not dilated or tender.  No edema, normal pulses  ASSESSMENT/PLAN:  Localized skin mass, lump, or swelling - right upper lateral thigh.  Ddx includes related to trauma, vs lipoma, vs other mass. Return for recheck if growing, tender or other concern  Declined flu shot

## 2017-09-24 NOTE — Patient Instructions (Signed)
The soft tissue swelling you have could be related to trauma/injury, vs benign fatty tumor.  One part feels a little firmer, which makes me suspect that you may have injured yourself in the past.  Keep an eye on this area, and return if increasing in size and pain.

## 2017-09-29 ENCOUNTER — Other Ambulatory Visit (INDEPENDENT_AMBULATORY_CARE_PROVIDER_SITE_OTHER): Payer: 59

## 2017-09-29 DIAGNOSIS — Z23 Encounter for immunization: Secondary | ICD-10-CM | POA: Diagnosis not present

## 2017-10-30 ENCOUNTER — Encounter: Payer: Self-pay | Admitting: Family Medicine

## 2017-11-09 ENCOUNTER — Encounter: Payer: Self-pay | Admitting: Family Medicine

## 2017-12-02 ENCOUNTER — Ambulatory Visit: Payer: Self-pay | Admitting: Family Medicine

## 2017-12-02 ENCOUNTER — Other Ambulatory Visit: Payer: 59

## 2017-12-02 ENCOUNTER — Other Ambulatory Visit (INDEPENDENT_AMBULATORY_CARE_PROVIDER_SITE_OTHER): Payer: 59

## 2017-12-02 DIAGNOSIS — Z23 Encounter for immunization: Secondary | ICD-10-CM

## 2017-12-10 ENCOUNTER — Ambulatory Visit: Payer: 59 | Admitting: Family Medicine

## 2017-12-10 ENCOUNTER — Encounter: Payer: Self-pay | Admitting: Family Medicine

## 2017-12-10 VITALS — BP 100/60 | HR 72 | Temp 97.3°F | Ht 66.0 in | Wt 166.6 lb

## 2017-12-10 DIAGNOSIS — R229 Localized swelling, mass and lump, unspecified: Secondary | ICD-10-CM

## 2017-12-10 NOTE — Patient Instructions (Signed)
There has not been any significant change or growth to the soft tissue lump at the right hip/thigh. Please return sooner than your next physical if you notice any rapid increase in size, pain, overlying skin concern or other changes.

## 2017-12-10 NOTE — Progress Notes (Signed)
Chief Complaint  Patient presents with  . Cyst    knot on right thigh-same as it was.    She presents for recheck of the lump on her right upper thigh.  She doesn't think the bump in her right upper thigh has really changed much--it is hasn't grown, it isn't painful or tender.  PMH, PSH, SH reviewed  She completed Shingrix series--had some discomfort, more after the second injection.  Outpatient Encounter Medications as of 12/10/2017  Medication Sig Note  . Apoaequorin (PREVAGEN PO) Take 1 capsule by mouth daily.   . Biotin 5000 MCG CAPS Take 1 capsule by mouth daily.   . Calcium Carbonate-Vit D-Min (CALCIUM 1200 PO) Take 1 each by mouth daily.   . cyproheptadine (PERIACTIN) 4 MG tablet Take 1 tablet (4 mg total) by mouth at bedtime as needed. for sleep   . dexlansoprazole (DEXILANT) 60 MG capsule Take 1 capsule (60 mg total) by mouth daily.   Marland Kitchen escitalopram (LEXAPRO) 10 MG tablet Take 1 tablet (10 mg total) by mouth daily.   . finasteride (PROSCAR) 5 MG tablet Take 2.5 mg by mouth.   . folic acid (FOLVITE) 154 MCG tablet Take 400 mcg by mouth daily.     Marland Kitchen glucosamine-chondroitin 500-400 MG tablet Take 1 tablet by mouth 3 (three) times daily. 06/16/2016: Takes once daily  . Iron-Vitamins (GERITOL PO) Take 1 tablet by mouth.     . loratadine (CLARITIN) 10 MG tablet Take 10 mg by mouth daily.   . Magnesium 500 MG TABS Take 1 tablet by mouth daily.     . Probiotic Product (RESTORA) CAPS Take 1 capsule by mouth daily.     . Turmeric 500 MG TABS Take 500 mg by mouth.   . vitamin D, CHOLECALCIFEROL, 400 UNITS tablet Take 400 Units by mouth daily.   . vitamin E 100 UNIT capsule Take 100 Units by mouth daily.     Marland Kitchen zinc gluconate 50 MG tablet Take 50 mg by mouth daily.     . ASA-APAP-Caff Buffered (VANQUISH PO) Take 2 tablets by mouth as needed (migraine). Reported on 06/16/2016 06/13/2015: Uses prn--very infrequent (a few times/year)   No facility-administered encounter medications on file as  of 12/10/2017.    Allergies  Allergen Reactions  . Amoxicillin-Pot Clavulanate Diarrhea and Nausea Only    ROS:  No fever, chills, URI symptoms, chest pain, bleeding, bruising, rash or other concerns.  Moods are good. Got a puppy, daughter is doing well.   PHYSICAL EXAM:  BP 100/60   Pulse 72   Temp (!) 97.3 F (36.3 C) (Tympanic)   Ht 5\' 6"  (1.676 m)   Wt 166 lb 9.6 oz (75.6 kg)   BMI 26.89 kg/m   Well appearing, pleasant female in no distress Right upper thigh: 2 x 3 cm soft tissue firm subcutaneous mass, located at the anterolateral upper thigh (anterior, at same level as trochanteric bursa).  There is a slightly firmer area, 1/2 cm that is more prominent/superficial within this area, but this is unchanged compared to prior exam in September.   ASSESSMENT/PLAN:  Localized skin mass, lump, or swelling - right upper thigh   Unclear etiology--whether there could have been prior trauma vs lipoma.  Doesn't seem to be rapidly changing. Reassured.  Return sooner than next physical if any significant change or concern.

## 2017-12-23 ENCOUNTER — Ambulatory Visit: Payer: 59 | Admitting: Family Medicine

## 2017-12-23 ENCOUNTER — Encounter: Payer: Self-pay | Admitting: Family Medicine

## 2017-12-23 VITALS — BP 120/82 | HR 87 | Ht 66.0 in | Wt 171.0 lb

## 2017-12-23 DIAGNOSIS — M461 Sacroiliitis, not elsewhere classified: Secondary | ICD-10-CM

## 2017-12-23 NOTE — Progress Notes (Signed)
   Subjective:    Patient ID: Laura Kirby, female    DOB: [birth year], 61 y.o.   MRN: [id number]  HPI She complains of a one day history of right-sided back pain.  No history of recent injury or overuse.  No numbness, tingling or weakness in the leg.  She did try one Advil without much relief.   Review of Systems     Objective:   Physical Exam Alert and in no distress.  Tender to palpation over the right SI joint.  Normal lumbar curve and motion.  Normal hip motion.  Negative straight leg raising with normal DTRs.  Corky Sox and stork test was negative.       Assessment & Plan:  Sacroiliitis ([place]) Heat to your back for 20 minutes 3 times per day.  Stretching after the heat 4 ibuprofen 3 times per day Return here if continued difficulty.

## 2017-12-23 NOTE — Patient Instructions (Signed)
Heat to your back for 20 minutes 3 times per day.  Stretching after the heat 4 ibuprofen 3 times per day

## 2017-12-29 HISTORY — PX: POLYPECTOMY: SHX149

## 2017-12-29 HISTORY — PX: COLONOSCOPY: SHX174

## 2018-03-31 ENCOUNTER — Telehealth: Payer: Self-pay | Admitting: Family Medicine

## 2018-04-03 NOTE — Telephone Encounter (Signed)
P.A. DEXILANT completed and approved

## 2018-04-07 NOTE — Telephone Encounter (Signed)
Called pt informed, pharmacy informed

## 2018-05-17 ENCOUNTER — Telehealth: Payer: Self-pay

## 2018-05-17 NOTE — Telephone Encounter (Signed)
Patient called wanting to know if we have samples of Restora?  Patient can be reached at [phone].

## 2018-05-19 ENCOUNTER — Encounter: Payer: Self-pay | Admitting: Gastroenterology

## 2018-05-20 ENCOUNTER — Encounter: Payer: Self-pay | Admitting: Gastroenterology

## 2018-06-09 ENCOUNTER — Telehealth: Payer: Self-pay | Admitting: Family Medicine

## 2018-06-09 DIAGNOSIS — F325 Major depressive disorder, single episode, in full remission: Secondary | ICD-10-CM

## 2018-06-09 DIAGNOSIS — F411 Generalized anxiety disorder: Secondary | ICD-10-CM

## 2018-06-09 NOTE — Telephone Encounter (Signed)
Recv'd fax from [facility] requesting refill Escitalopram 10mg  #90

## 2018-06-11 MED ORDER — ESCITALOPRAM OXALATE 10 MG PO TABS: 10.0000 mg | ORAL_TABLET | Freq: Every day | ORAL | 0 refills | Status: DC

## 2018-06-11 NOTE — Telephone Encounter (Signed)
Pt called back and rescheduled her physical. Stated she will be out of her medication before her next appt. Her physical is scheduled for 09/02/18. She is on the waiting list for an earlier appointment.

## 2018-06-11 NOTE — Telephone Encounter (Signed)
Refilled med for #90

## 2018-06-11 NOTE — Telephone Encounter (Signed)
Left message for pt to call me back 

## 2018-06-11 NOTE — Telephone Encounter (Signed)
See if pt has enough to last until her appt (I think she will); if not, okay for #90, no refills

## 2018-06-23 ENCOUNTER — Encounter: Payer: 59 | Admitting: Family Medicine

## 2018-06-28 ENCOUNTER — Other Ambulatory Visit: Payer: Self-pay | Admitting: Family Medicine

## 2018-06-28 DIAGNOSIS — K219 Gastro-esophageal reflux disease without esophagitis: Secondary | ICD-10-CM

## 2018-07-14 ENCOUNTER — Encounter: Payer: Self-pay | Admitting: Family Medicine

## 2018-07-14 ENCOUNTER — Ambulatory Visit (AMBULATORY_SURGERY_CENTER): Payer: Self-pay

## 2018-07-14 ENCOUNTER — Ambulatory Visit: Payer: BLUE CROSS/BLUE SHIELD | Admitting: Family Medicine

## 2018-07-14 ENCOUNTER — Other Ambulatory Visit: Payer: Self-pay

## 2018-07-14 VITALS — Ht 66.0 in | Wt 165.0 lb

## 2018-07-14 VITALS — BP 120/70 | HR 72 | Ht 66.0 in | Wt 165.0 lb

## 2018-07-14 DIAGNOSIS — F439 Reaction to severe stress, unspecified: Secondary | ICD-10-CM | POA: Diagnosis not present

## 2018-07-14 DIAGNOSIS — M940 Chondrocostal junction syndrome [Tietze]: Secondary | ICD-10-CM

## 2018-07-14 DIAGNOSIS — Z860100 Personal history of colon polyps, unspecified: Secondary | ICD-10-CM

## 2018-07-14 DIAGNOSIS — R0789 Other chest pain: Secondary | ICD-10-CM

## 2018-07-14 MED ORDER — NA SULFATE-K SULFATE-MG SULF 17.5-3.13-1.6 GM/177ML PO SOLN: 1.0000 | Freq: Once | ORAL | 0 refills | Status: AC

## 2018-07-14 NOTE — Patient Instructions (Signed)
You were tender at the junction where the rib meets the breast-bone.  This may be related to your change in exercise. Using warm compresses and an anti-inflammatory such as advil,motrin or aleve can be helpful.    Costochondritis Costochondritis is swelling and irritation (inflammation) of the tissue (cartilage) that connects your ribs to your breastbone (sternum). This causes pain in the front of your chest. Usually, the pain:  Starts gradually.  Is in more than one rib.  This condition usually goes away on its own over time. Follow these instructions at home:  Do not do anything that makes your pain worse.  If directed, put ice on the painful area: ? Put ice in a plastic bag. ? Place a towel between your skin and the bag. ? Leave the ice on for 20 minutes, 2-3 times a day.  If directed, put heat on the affected area as often as told by your doctor. Use the heat source that your doctor tells you to use, such as a moist heat pack or a heating pad. ? Place a towel between your skin and the heat source. ? Leave the heat on for 20-30 minutes. ? Take off the heat if your skin turns bright red. This is very important if you cannot feel pain, heat, or cold. You may have a greater risk of getting burned.  Take over-the-counter and prescription medicines only as told by your doctor.  Return to your normal activities as told by your doctor. Ask your doctor what activities are safe for you.  Keep all follow-up visits as told by your doctor. This is important. Contact a doctor if:  You have chills or a fever.  Your pain does not go away or it gets worse.  You have a cough that does not go away. Get help right away if:  You are short of breath. This information is not intended to replace advice given to you by your health care provider. Make sure you discuss any questions you have with your health care provider. Document Released: 06/02/2008 Document Revised: 07/04/2016 Document  Reviewed: 04/09/2016 Elsevier Interactive Patient Education  [employer].

## 2018-07-14 NOTE — Progress Notes (Signed)
Chief Complaint  Patient presents with  . Chest Pain    ache in her chest, did lift some weights over her head last Wed. Not sure if it was heartburn. Also has a lot if stress going on in her home.     6 days ago she had an ache in the center of her sternum/chest. It was a stressful day, she noticed the discomfort prior to dinner, when at home.  It was "an ache", not a pain or pressure.  She had done overhead weights the day before, involved more chest than usual routine.  She did notice it is sore to press on the breast bone.  It remained achey for about 3 hours that day, no further ache. She did notice that it was tender for her to press on the sternum that day. No longer tender.   She has been under a lot of stress since her daughter moved back home 9 days ago (with her dog)--she is supposed to be moving out today, has an apartment.    She missed her yoga this past Saturday.  Wine also helps stress reduction.  Drinks a little bottle of Corona or 1/2 glass of red wine 5-6 days/week.  Does yoga every morning during the week, but hard since her daughter is there.  She stopped eating meat--vegetarian since 2.5 weeks ago.  Has dairy.  She is here to confirm that her chest pain is "just stress" and not her heart.  She denies heartburn, reflux.   PMH, PSH, SH reviewed  Outpatient Encounter Medications as of 07/14/2018  Medication Sig Note  . Apoaequorin (PREVAGEN PO) Take 1 capsule by mouth daily.   . Biotin 5000 MCG CAPS Take 1 capsule by mouth daily.   Marland Kitchen BLACK CURRANT SEED OIL PO Take by mouth.   . Calcium Carbonate-Vit D-Min (CALCIUM 1200 PO) Take 1 each by mouth daily.   . cyproheptadine (PERIACTIN) 4 MG tablet Take 1 tablet (4 mg total) by mouth at bedtime as needed. for sleep   . DEXILANT 60 MG capsule TAKE 1 CAPSULE BY MOUTH ONCE DAILY   . escitalopram (LEXAPRO) 10 MG tablet Take 1 tablet (10 mg total) by mouth daily.   . finasteride (PROSCAR) 5 MG tablet Take 2.5 mg by mouth.    . folic acid (FOLVITE) 532 MCG tablet Take 400 mcg by mouth daily.     Marland Kitchen glucosamine-chondroitin 500-400 MG tablet Take 1 tablet by mouth 3 (three) times daily. 06/16/2016: Takes once daily  . Iron-Vitamins (GERITOL PO) Take 1 tablet by mouth.     . loratadine (CLARITIN) 10 MG tablet Take 10 mg by mouth daily.   . Magnesium 500 MG TABS Take 1 tablet by mouth daily.     . Probiotic Product (RESTORA) CAPS Take 1 capsule by mouth daily.     . Turmeric 500 MG TABS Take 500 mg by mouth.   . vitamin D, CHOLECALCIFEROL, 400 UNITS tablet Take 400 Units by mouth daily.   . vitamin E 100 UNIT capsule Take 100 Units by mouth daily.     Marland Kitchen zinc gluconate 50 MG tablet Take 50 mg by mouth daily.     . ASA-APAP-Caff Buffered (VANQUISH PO) Take 2 tablets by mouth as needed (migraine). Reported on 06/16/2016 06/13/2015: Uses prn--very infrequent (a few times/year)  . Na Sulfate-K Sulfate-Mg Sulf 17.5-3.13-1.6 GM/177ML SOLN Take 1 kit by mouth once for 1 dose. (Patient not taking: Reported on 07/14/2018)    No facility-administered encounter medications on  file as of 07/14/2018.    Allergies  Allergen Reactions  . Amoxicillin-Pot Clavulanate Diarrhea and Nausea Only    ROS: no fever, chills, URI symptoms. No exertional chest pain, shortness of breath. Chest pain and stress per HPI.  No leg swelling, GI complaints or other concerns  PHYSICAL EXAM:  BP 120/70   Pulse 72   Ht _0  (1.676 m)   Wt 165 lb (74.8 kg)   SpO2 99%   BMI 26.63 kg/m    Well appearing, pleasant female, in no distress. Somewhat stressed--daughter called her twice and texting while she was in exam room HEENT conjunctiva and sclera clear, EOMI Neck: no lymphadenopathy, thyromegaly or carotid bruit Heart: regular rate and rhythm, no murmur, no ectopy Lungs: clear bilaterally Chest wall: Tender to palpation at right costochondral junctions x 2, starting at mid breast area. nontender on the left or over the sternum itself.  No pain in  the area with a deep breath Extremities: no edema Abdomen: soft, nontender, no mass Psych: stressed. Full range of affect, normal hygiene, grooming, eye contact and speech Neuro: alert and oriented, cranial nerves intact, normal gait  EKG:  NSR, rate 65.  Incomplete RBBB noted--compared to EKG in epic from 10/13/16 and is completely unchanged (RBBB not noted on computer reading, but tracing is not any different than today's).    ASSESSMENT/PLAN:  Chest wall pain  Costochondritis, acute  Stress - short-lived; daiughter has new apartment and should be moving out soon (but not today, like she was supposed to).    Supportive measures reviewed--warm compresses, OTC NSAIDs. Discussed stress reduction. Reassured that EKG was no different than 09/2016.

## 2018-07-14 NOTE — Progress Notes (Signed)
No egg or soy allergy known to patient  No issues with past sedation with any surgeries  or procedures, no intubation problems  No diet pills per patient No home 02 use per patient  No blood thinners per patient  Pt denies issues with constipation  No A fib or A flutter  EMMI video sent to pt's e mail , pt declined    

## 2018-07-14 NOTE — Addendum Note (Signed)
Addended by: Carolee Rota F on: 07/14/2018 03:37 PM   Modules accepted: Orders

## 2018-07-15 ENCOUNTER — Encounter: Payer: Self-pay | Admitting: Gastroenterology

## 2018-07-21 ENCOUNTER — Other Ambulatory Visit: Payer: Self-pay | Admitting: Family Medicine

## 2018-07-21 DIAGNOSIS — G4700 Insomnia, unspecified: Secondary | ICD-10-CM

## 2018-07-22 NOTE — Telephone Encounter (Signed)
Is this okay?

## 2018-07-27 ENCOUNTER — Encounter: Payer: Self-pay | Admitting: Gastroenterology

## 2018-07-27 ENCOUNTER — Ambulatory Visit (AMBULATORY_SURGERY_CENTER): Payer: Self-pay | Admitting: Gastroenterology

## 2018-07-27 VITALS — BP 105/64 | HR 61 | Temp 98.6°F | Resp 10 | Ht 66.0 in | Wt 165.0 lb

## 2018-07-27 DIAGNOSIS — D123 Benign neoplasm of transverse colon: Secondary | ICD-10-CM

## 2018-07-27 DIAGNOSIS — K635 Polyp of colon: Secondary | ICD-10-CM

## 2018-07-27 DIAGNOSIS — Z86010 Personal history of colonic polyps: Secondary | ICD-10-CM | POA: Diagnosis not present

## 2018-07-27 DIAGNOSIS — Z860101 Personal history of adenomatous and serrated colon polyps: Secondary | ICD-10-CM

## 2018-07-27 MED ORDER — SODIUM CHLORIDE 0.9 % IV SOLN: 500.0000 mL | Freq: Once | INTRAVENOUS | Status: DC

## 2018-07-27 NOTE — Patient Instructions (Signed)
Handouts given:  Diverticulosis and Polyps  YOU HAD AN ENDOSCOPIC PROCEDURE TODAY AT [facility]:   Refer to the procedure report that was given to you for any specific questions about what was found during the examination.  If the procedure report does not answer your questions, please call your gastroenterologist to clarify.  If you requested that your care partner not be given the details of your procedure findings, then the procedure report has been included in a sealed envelope for you to review at your convenience later.  YOU SHOULD EXPECT: Some feelings of bloating in the abdomen. Passage of more gas than usual.  Walking can help get rid of the air that was put into your GI tract during the procedure and reduce the bloating. If you had a lower endoscopy (such as a colonoscopy or flexible sigmoidoscopy) you may notice spotting of blood in your stool or on the toilet paper. If you underwent a bowel prep for your procedure, you may not have a normal bowel movement for a few days.  Please Note:  You might notice some irritation and congestion in your nose or some drainage.  This is from the oxygen used during your procedure.  There is no need for concern and it should clear up in a day or so.  SYMPTOMS TO REPORT IMMEDIATELY:   Following lower endoscopy (colonoscopy or flexible sigmoidoscopy):  Excessive amounts of blood in the stool  Significant tenderness or worsening of abdominal pains  Swelling of the abdomen that is new, acute  Fever of 100F or higher  For urgent or emergent issues, a gastroenterologist can be reached at any hour by calling [phone].   DIET:  We do recommend a small meal at first, but then you may proceed to your regular diet.  Drink plenty of fluids but you should avoid alcoholic beverages for 24 hours.  ACTIVITY:  You should plan to take it easy for the rest of today and you should NOT DRIVE or use heavy machinery until tomorrow (because of the  sedation medicines used during the test).    FOLLOW UP: Our staff will call the number listed on your records the next business day following your procedure to check on you and address any questions or concerns that you may have regarding the information given to you following your procedure. If we do not reach you, we will leave a message.  However, if you are feeling well and you are not experiencing any problems, there is no need to return our call.  We will assume that you have returned to your regular daily activities without incident.  If any biopsies were taken you will be contacted by phone or by letter within the next 1-3 weeks.  Please call us at [phone] if you have not heard about the biopsies in 3 weeks.    SIGNATURES/CONFIDENTIALITY: You and/or your care partner have signed paperwork which will be entered into your electronic medical record.  These signatures attest to the fact that that the information above on your After Visit Summary has been reviewed and is understood.  Full responsibility of the confidentiality of this discharge information lies with you and/or your care-partner.

## 2018-07-27 NOTE — Progress Notes (Signed)
Spontaneous respirations throughout. VSS. Resting comfortably. To PACU on room air. Report to  RN. 

## 2018-07-27 NOTE — Progress Notes (Signed)
Called to room to assist during endoscopic procedure.  Patient ID and intended procedure confirmed with present staff. Received instructions for my participation in the procedure from the performing physician.  

## 2018-07-27 NOTE — Op Note (Signed)
[facility] Patient Name: Imagine Nest Procedure Date: 07/27/2018 1:57 PM MRN: [id number] Endoscopist: Ladene Artist , MD Age: 62 Referring MD:  Date of Birth: [birth year] Gender: Female Account #: [id number] Procedure:                Colonoscopy Indications:              Surveillance: Personal history of adenomatous                            polyps on last colonoscopy 5 years ago Medicines:                Monitored Anesthesia Care Procedure:                Pre-Anesthesia Assessment:                           - Prior to the procedure, a History and Physical                            was performed, and patient medications and                            allergies were reviewed. The patient's tolerance of                            previous anesthesia was also reviewed. The risks                            and benefits of the procedure and the sedation                            options and risks were discussed with the patient.                            All questions were answered, and informed consent                            was obtained. Prior Anticoagulants: The patient has                            taken no previous anticoagulant or antiplatelet                            agents. ASA Grade Assessment: II - A patient with                            mild systemic disease. After reviewing the risks                            and benefits, the patient was deemed in                            satisfactory condition to undergo the procedure.  After obtaining informed consent, the colonoscope                            was passed under direct vision. Throughout the                            procedure, the patient's blood pressure, pulse, and                            oxygen saturations were monitored continuously. The                            Colonoscope was introduced through the anus and                            advanced to the  the cecum, identified by palpation.                            The ileocecal valve, appendiceal orifice, and                            rectum were photographed. The quality of the bowel                            preparation was good. The colonoscopy was performed                            without difficulty. The patient tolerated the                            procedure well. Scope In: 2:00:43 PM Scope Out: 2:18:15 PM Scope Withdrawal Time: 0 hours 13 minutes 0 seconds  Total Procedure Duration: 0 hours 17 minutes 32 seconds  Findings:                 The perianal and digital rectal examinations were                            normal.                           Two sessile polyps were found in the transverse                            colon. The polyps were 7 mm in size. These polyps                            were removed with a cold snare. Resection and                            retrieval were complete.                           A 3 mm polyp was found in the hepatic flexure. The  polyp was sessile. The polyp was removed with a                            cold biopsy forceps. Resection and retrieval were                            complete.                           Multiple medium-mouthed diverticula were found in                            the left colon. There was no evidence of                            diverticular bleeding.                           The exam was otherwise without abnormality on                            direct and retroflexion views. Complications:            No immediate complications. Estimated blood loss:                            None. Estimated Blood Loss:     Estimated blood loss: none. Impression:               - Two 7 mm polyps in the transverse colon, removed                            with a cold snare. Resected and retrieved.                           - One 3 mm polyp at the hepatic flexure, removed                             with a cold biopsy forceps. Resected and retrieved.                           - Mild diverticulosis in the left colon. There was                            no evidence of diverticular bleeding.                           - The examination was otherwise normal on direct                            and retroflexion views. Recommendation:           - Repeat colonoscopy in 5 years for surveillance.                           - Patient has a contact  number available for                            emergencies. The signs and symptoms of potential                            delayed complications were discussed with the                            patient. Return to normal activities tomorrow.                            Written discharge instructions were provided to the                            patient.                           - Resume previous diet.                           - Continue present medications.                           - Await pathology results. Ladene Artist, MD 07/27/2018 2:21:39 PM This report has been signed electronically.

## 2018-07-27 NOTE — Progress Notes (Signed)
Pt's states no medical or surgical changes since previsit or office visit. 

## 2018-07-28 ENCOUNTER — Telehealth: Payer: Self-pay

## 2018-07-28 ENCOUNTER — Telehealth: Payer: Self-pay | Admitting: *Deleted

## 2018-07-28 NOTE — Telephone Encounter (Signed)
  Follow up Call-  Call back number 07/27/2018  Post procedure Call Back phone  # [phone]  Permission to leave phone message Yes  Some recent data might be hidden     No ID on answering machine.  No message left. Amiracle Neises/call-back 2nd attempt [place]

## 2018-07-28 NOTE — Telephone Encounter (Signed)
1st follow -up call attempt.  Voicemail with phone number identifier.  Message left to call if questions or concerns.

## 2018-08-09 ENCOUNTER — Encounter: Payer: Self-pay | Admitting: Family Medicine

## 2018-08-10 ENCOUNTER — Encounter: Payer: Self-pay | Admitting: Gastroenterology

## 2018-08-25 ENCOUNTER — Encounter: Payer: Self-pay | Admitting: Family Medicine

## 2018-08-25 DIAGNOSIS — D126 Benign neoplasm of colon, unspecified: Secondary | ICD-10-CM | POA: Insufficient documentation

## 2018-09-02 ENCOUNTER — Encounter: Payer: Self-pay | Admitting: Family Medicine

## 2018-09-02 DIAGNOSIS — Z1231 Encounter for screening mammogram for malignant neoplasm of breast: Secondary | ICD-10-CM | POA: Diagnosis not present

## 2018-09-02 LAB — HM MAMMOGRAPHY

## 2018-09-08 ENCOUNTER — Encounter: Payer: Self-pay | Admitting: *Deleted

## 2018-09-12 ENCOUNTER — Other Ambulatory Visit: Payer: Self-pay | Admitting: Family Medicine

## 2018-09-12 DIAGNOSIS — F325 Major depressive disorder, single episode, in full remission: Secondary | ICD-10-CM

## 2018-09-12 DIAGNOSIS — F411 Generalized anxiety disorder: Secondary | ICD-10-CM

## 2018-09-13 NOTE — Telephone Encounter (Signed)
Last CPE was 05/2018, she canceled this year's . She needs to be on a cancellation list, as she will be almost a year late for her physical at that point. She was recently seen for acute problem. Okay for #90, but hopefully we can get her in for CPE sooner than April

## 2018-09-13 NOTE — Telephone Encounter (Signed)
Is this okay to refill? Does not have appt scheduled until 03/2019.

## 2018-09-29 ENCOUNTER — Encounter: Payer: Self-pay | Admitting: Family Medicine

## 2018-09-29 ENCOUNTER — Ambulatory Visit: Payer: BLUE CROSS/BLUE SHIELD | Admitting: Family Medicine

## 2018-09-29 VITALS — BP 110/70 | HR 72 | Temp 97.7°F | Ht 66.0 in | Wt 164.0 lb

## 2018-09-29 DIAGNOSIS — R350 Frequency of micturition: Secondary | ICD-10-CM | POA: Diagnosis not present

## 2018-09-29 DIAGNOSIS — N3000 Acute cystitis without hematuria: Secondary | ICD-10-CM | POA: Diagnosis not present

## 2018-09-29 LAB — POCT URINALYSIS DIP (PROADVANTAGE DEVICE)
BILIRUBIN UA: NEGATIVE
BILIRUBIN UA: NEGATIVE mg/dL
Glucose, UA: NEGATIVE mg/dL
Nitrite, UA: NEGATIVE
PH UA: 6 (ref 5.0–8.0)
Protein Ur, POC: NEGATIVE mg/dL
RBC UA: NEGATIVE
Specific Gravity, Urine: 1.015
Urobilinogen, Ur: NEGATIVE

## 2018-09-29 MED ORDER — NITROFURANTOIN MONOHYD MACRO 100 MG PO CAPS: 100.0000 mg | ORAL_CAPSULE | Freq: Two times a day (BID) | ORAL | 0 refills | Status: DC

## 2018-09-29 NOTE — Patient Instructions (Signed)

## 2018-09-29 NOTE — Progress Notes (Signed)
Chief Complaint  Patient presents with  . Urinary Frequency    as well as burning and urgency x 2 weeks. Took OTC meds but is not doing much better.    2 weeks ago she started with urinary urgency, feeling "yucky"--nauseous. Tried Cystex, which helped with her symptoms. She took it last week, taking it prn.  Symptoms had gotten better, but recurred yesterday; last dose of cystex was yesterday.  Continues to have urgency, frequency, dysuria and some nausea and low back pain.  Going out of town for work later this week.  She had Josph Macho procedure through her GYN, had a "touch up" right before her symptoms started. First series was over a year ago, which was helpful.  PMH, PSH, SH reviewed Chart reviewed--had UTI treated with macrobid 07/2017 in UC, culture showed no growth.  Outpatient Encounter Medications as of 09/29/2018  Medication Sig Note  . Apoaequorin (PREVAGEN PO) Take 1 capsule by mouth daily.   . ASA-APAP-Caff Buffered (VANQUISH PO) Take 2 tablets by mouth as needed (migraine). Reported on 06/16/2016 06/13/2015: Uses prn--very infrequent (a few times/year)  . Biotin 5000 MCG CAPS Take 1 capsule by mouth daily.   Marland Kitchen BLACK CURRANT SEED OIL PO Take by mouth.   . Calcium Carbonate-Vit D-Min (CALCIUM 1200 PO) Take 1 each by mouth daily.   . cyproheptadine (PERIACTIN) 4 MG tablet TAKE 1 TABLET BY MOUTH AT BEDTIME AS NEEDED FOR SLEEP   . DEXILANT 60 MG capsule TAKE 1 CAPSULE BY MOUTH ONCE DAILY   . escitalopram (LEXAPRO) 10 MG tablet TAKE 1 TABLET BY MOUTH ONCE DAILY   . finasteride (PROSCAR) 5 MG tablet Take 2.5 mg by mouth.   . folic acid (FOLVITE) 568 MCG tablet Take 400 mcg by mouth daily.     Marland Kitchen glucosamine-chondroitin 500-400 MG tablet Take 1 tablet by mouth 3 (three) times daily. 06/16/2016: Takes once daily  . Iron-Vitamins (GERITOL PO) Take 1 tablet by mouth.     . loratadine (CLARITIN) 10 MG tablet Take 10 mg by mouth daily.   . Magnesium 500 MG TABS Take 1 tablet by mouth daily.      . Probiotic Product (RESTORA) CAPS Take 1 capsule by mouth daily.     . Turmeric 500 MG TABS Take 500 mg by mouth.   . vitamin D, CHOLECALCIFEROL, 400 UNITS tablet Take 400 Units by mouth daily.   . vitamin E 100 UNIT capsule Take 100 Units by mouth daily.     Marland Kitchen zinc gluconate 50 MG tablet Take 50 mg by mouth daily.      Facility-Administered Encounter Medications as of 09/29/2018  Medication  . 0.9 %  sodium chloride infusion   Allergies  Allergen Reactions  . Amoxicillin-Pot Clavulanate Diarrhea and Nausea Only   ROS: No fever, chills.  No vomiting, diarrhea, flank pain. No headaches, dizziness, chest pain, bleeding, bruising, rashes.  UTI and intermittent nausea and lower back pain per HPI.   PHYSICAL EXAM:  BP 110/70   Pulse 72   Temp 97.7 F (36.5 C) (Tympanic)   Ht 5\' 6"  (1.676 m)   Wt 164 lb (74.4 kg)   BMI 26.47 kg/m    Wt Readings from Last 3 Encounters:  09/29/18 164 lb (74.4 kg)  07/27/18 165 lb (74.8 kg)  07/14/18 165 lb (74.8 kg)   Pleasant, well-appearing female in no distress Neck: no lymphadenopathy, thyromegaly or mass Heart: regular rate and rhythm Lungs: clear bilaterally Back: no CVA tenderness Abdomen: mild suprapubic  tenderness Extremities: no edema Skin: normal turgor, no rash Neuro: alert and oriented, cranial nerves intact, normal gait Psych: normal mood, affect, hygiene and grooming  Urine dip: 2+ leukocytes  ASSESSMENT/PLAN:  Acute cystitis without hematuria - Plan: nitrofurantoin, macrocrystal-monohydrate, (MACROBID) 100 MG capsule, Urine Culture  Urinary frequency - Plan: POCT Urinalysis DIP (Proadvantage Device)

## 2018-10-02 LAB — URINE CULTURE

## 2018-10-04 NOTE — Progress Notes (Signed)
Chief Complaint  Patient presents with  . Annual Exam    fasting annual no pap-sees Dr.Banga. Scheduled for eye exam this month. Could not give UA. When she works out with weights she is still having some pain in her breast bone area.   . Flu Vaccine    declined.     Laura Kirby is a 62 y.o. female who presents for a complete physical.  She has the following concerns:  Seen 10/2 with acute cystitis.  Urine culture revealed Staphylococcus haemolyticus, which was sensitive to the macrobid that was prescribed to treat her UTI. She reports her symptoms have completely resolved. She completed the course of antibiotics. Denies any symptoms of yeast infection.  Hyperlipidemia follow-up: Patient is reportedly following a low-fat, low cholesterol diet. Her cholesterol has been controlled by diet. She continues to eat lots of fruits and vegetables. Doesn't eat red meat, and gave up eating poultry (eats occasional fish/seafood) about 3 months ago. Admits to eating more carbs and cheese since she gave up meat. She eats a lot of oatmeal, egg white omelets on the weekends (or sauteed swiss chard). She uses creamy salad dressings, only 1x/week (eating fewer salads).  Eating some more processed vegetarian foods (Gardein) Lab Results  Component Value Date   CHOL 210 (H) 06/22/2017   HDL 59 06/22/2017   LDLCALC 133 (H) 06/22/2017   TRIG 88 06/22/2017   CHOLHDL 3.6 06/22/2017    Depression/anxiety: Very well controlled on her medication. She has not needed to use xanax in years. Denies side effects from Lexapro. Her daughter is doing well currently (she still needs to help her financially some, but living on her own). She has some stress in that she is looking to buy a new house (in process of getting divorce).  GERD: Takes Dexilant daily during the week, with good control of symptoms. Doesn't take it on the weekends, and doesn't have any reflux, unless she eats tomato-based food. Does this  to help make the pills last longer. Denies dysphagia.   Headaches: Continues to only get headaches infrequently, related to stress, and sometimes seasons. Relieved by Vanquish as needed, only needing a few times/year.   Insomnia: Takes periactin nightly to help with sleep. She has some seasonal allergies, which only started flaring recently with some nasal stuffiness, PND.  Claritin is effective in treating her symptoms in the past, hasn't yet started.  Sees dermatologist at [facility] for treatment of alopecia (previously got steroid injections in her scalp, and prescribed finasteride.)  She stopped taking the finasteride about a month ago, didn't see any benefit.   She has also been getting Botox injections there (cosmetic).  She saw Lesia Hausen, NP 9/12 and had booster Josph Macho Treatment procedure. She completed the initial 3 treatments 05/2017. Had good results until recently, so underwent booster treatment.  She is not currently in a sexual relationship    Immunization History  Administered Date(s) Administered  . Td 11/17/2005  . Tdap 10/18/2011  . Zoster Recombinat (Shingrix) 09/29/2017, 12/02/2017   Refuses flu shots Last Pap smear: 07/2016 with Dr. Dellis Filbert, per pt; last annual exam was with Dr. Terri Piedra in 07/2017, and she is scheduled again later this month. Last mammogram: 08/2018 Last colonoscopy: 06/2018, had tubular adenoma Last DEXA: never Dentist: twice a year Ophtho: yearly Exercise: weights, yoga stretches and poses x 20 minutes daily. Goes to the gym--exercise bike x 15 minutes and walks 15 minutes just once/week  Past Medical History:  Diagnosis Date  .  Anemia    history   . Anxiety state, unspecified    on daily xanax for a couple of years in past  . Arthritis    L AC joint/shoulder  . Cataract   . Depression   . Fibroid uterus    GYN--Dr. Terri Piedra  . GERD (gastroesophageal reflux disease)    EGD at [facility]  . Hearing loss in left ear 2008   s/p surgery  12/2012, has persistent hearing loss  . Hyperlipidemia    diet controlled  . Ovarian cyst   . Sickle cell trait [facility])     Past Surgical History:  Procedure Laterality Date  . COLONOSCOPY    . DILITATION & CURRETTAGE/HYSTROSCOPY WITH VERSAPOINT RESECTION N/A 09/02/2013   Procedure: DILATATION & CURETTAGE/HYSTEROSCOPY WITH VERSAPOINT RESECTION;  Surgeon: Princess Bruins, MD;  Location: [facility];  Service: Gynecology;  Laterality: N/A;  . ENDOMETRIAL ABLATION    . MYOMECTOMY     Dr. Dellis Filbert - abdominal myomectomy  . POLYPECTOMY    . PTOSIS REPAIR Bilateral 10/17/2015  . STAPEDES SURGERY Left 01/26/13   at [facility]; otosclerosis; persistent L hearing loss after surgery  . WISDOM TOOTH EXTRACTION  07/2015    Social History   Socioeconomic History  . Marital status: Married    Spouse name: Not on file  . Number of children: 1  . Years of education: Not on file  . Highest education level: Not on file  Occupational History  . Occupation: works from home.  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 Financial resource strain: Not on file  . Food insecurity:    Worry: Not on file    Inability: Not on file  . Transportation needs:    Medical: Not on file    Non-medical: Not on file  Tobacco Use  . Smoking status: Never Smoker  . Smokeless tobacco: Never Used  Substance and Sexual Activity  . Alcohol use: Yes    Alcohol/week: 5.0 standard drinks    Types: 5 Glasses of wine per week    Comment: wine/beer with dinner most days  . Drug use: No  . Sexual activity: Yes    Partners: Male    Birth control/protection: Post-menopausal  Lifestyle  . Physical activity:    Days per week: Not on file    Minutes per session: Not on file  . Stress: Not on file  Relationships  . Social connections:    Talks on phone: Not on file    Gets together: Not on file    Attends religious service: Not on file    Active member of club or  organization: Not on file    Attends meetings of clubs or organizations: Not on file    Relationship status: Not on file  . Intimate partner violence:    Fear of current or ex partner: Not on file    Emotionally abused: Not on file    Physically abused: Not on file    Forced sexual activity: Not on file  Other Topics Concern  . Not on file  Social History Narrative   Got Ph.D. In 06/2011.     Separated in 2017, in process of getting divorce   Lives with her Schnoodle Mel Almond) puppy 11/2017.   Daughter lives in [place].    Works for [employer] for [employer], doing Oceanographer).  Still teaching online course.    Family History  Problem Relation Age of Onset  .  Hyperlipidemia Mother   . Hypertension Mother   . Diabetes Mother   . Dementia Mother   . Irritable bowel syndrome Mother   . Stroke Mother 14  . Alcohol abuse Father   . Kidney disease Father   . Ulcers Father   . Allergies Daughter   . Eczema Daughter   . Cancer Maternal Aunt        breast cancer (60's)  . Cancer Maternal Uncle 70       colon  . Colon cancer Maternal Uncle 37  . Diabetes Maternal Grandfather   . Stroke Maternal Grandfather   . Heart disease Maternal Grandmother   . Cancer Paternal Aunt        ?unsure of type (stomach vs ovarian)  . Colon polyps Neg Hx   . Esophageal cancer Neg Hx   . Rectal cancer Neg Hx   . Stomach cancer Neg Hx     Outpatient Encounter Medications as of 10/05/2018  Medication Sig Note  . Apoaequorin (PREVAGEN PO) Take 1 capsule by mouth daily.   . Biotin 5000 MCG CAPS Take 1 capsule by mouth daily.   Marland Kitchen BLACK CURRANT SEED OIL PO Take by mouth.   . Calcium Carbonate-Vit D-Min (CALCIUM 1200 PO) Take 1 each by mouth daily.   . cyproheptadine (PERIACTIN) 4 MG tablet TAKE 1 TABLET BY MOUTH AT BEDTIME AS NEEDED FOR SLEEP   . DEXILANT 60 MG capsule TAKE 1 CAPSULE BY MOUTH ONCE DAILY 10/05/2018: Takes M-F  . escitalopram (LEXAPRO) 10 MG tablet TAKE 1 TABLET BY  MOUTH ONCE DAILY   . folic acid (FOLVITE) 161 MCG tablet Take 400 mcg by mouth daily.     Marland Kitchen glucosamine-chondroitin 500-400 MG tablet Take 1 tablet by mouth 3 (three) times daily. 06/16/2016: Takes once daily  . Iron-Vitamins (GERITOL PO) Take 1 tablet by mouth.     . Magnesium 500 MG TABS Take 1 tablet by mouth daily.     . Probiotic Product (RESTORA) CAPS Take 1 capsule by mouth daily.     . Turmeric 500 MG TABS Take 500 mg by mouth.   . vitamin D, CHOLECALCIFEROL, 400 UNITS tablet Take 400 Units by mouth daily.   . vitamin E 100 UNIT capsule Take 100 Units by mouth daily.     Marland Kitchen zinc gluconate 50 MG tablet Take 50 mg by mouth daily.     . ASA-APAP-Caff Buffered (VANQUISH PO) Take 2 tablets by mouth as needed (migraine). Reported on 06/16/2016 06/13/2015: Uses prn--very infrequent (a few times/year)  . finasteride (PROSCAR) 5 MG tablet Take 2.5 mg by mouth. 10/05/2018: Stopped taking last month, didn't seem effective  . loratadine (CLARITIN) 10 MG tablet Take 10 mg by mouth daily.   . [DISCONTINUED] nitrofurantoin, macrocrystal-monohydrate, (MACROBID) 100 MG capsule Take 1 capsule (100 mg total) by mouth 2 (two) times daily. (Patient not taking: Reported on 10/05/2018)    Facility-Administered Encounter Medications as of 10/05/2018  Medication  . 0.9 %  sodium chloride infusion    Allergies  Allergen Reactions  . Amoxicillin-Pot Clavulanate Diarrhea and Nausea Only   ROS: The patient denies fever, vision changes, weight changes, sore throat, breast concerns, chest pain, palpitations, dizziness, syncope, dyspnea on exertion, cough, swelling, nausea, vomiting, diarrhea, constipation, abdominal pain, melena, hematochezia, indigestion/heartburn, vaginal bleeding, discharge, odor or itch, numbness, tingling, weakness, tremor, suspicious skin lesions.  No recent hemorrhoidal bleeding. Mild L hearing loss, unchanged. Moods are good, insomnia controlled with medication. Recent UTI, symptoms resolved  with ABX  She continues to have some intermittent pain in chest related to the weights (seen for this, see prior visit), sporadic when using heavier weights with certain exercises. Bilateral knee pain for 6-12 months, sporadic.   PHYSICAL EXAM:  BP 104/76   Pulse 72   Ht 5\' 6"  (1.676 m)   Wt 162 lb 6.4 oz (73.7 kg)   BMI 26.21 kg/m   Wt Readings from Last 3 Encounters:  10/05/18 162 lb 6.4 oz (73.7 kg)  09/29/18 164 lb (74.4 kg)  07/27/18 165 lb (74.8 kg)    General Appearance:  Alert, cooperative, no distress, appears stated age   Head:  Normocephalic, without obvious abnormality, atraumatic   Eyes:  PERRL, conjunctiva/corneas clear, EOM's intact, fundi benign   Ears:  Normal TM's and external ear canals--canals are somewhat narrow.  Nose:  Nares normal, mucosa normal, no drainage or sinus tenderness   Throat:  Lips, mucosa, and tongue normal; teeth and gums normal   Neck:  Supple, no lymphadenopathy; thyroid: no enlargement/ tenderness/nodules; no carotid bruit or JVD   Back:  Spine nontender, no curvature, ROM normal, no CVA tenderness   Lungs:  Clear to auscultation bilaterally without wheezes, rales or ronchi; respirations unlabored   Chest Wall:  No tenderness or deformity   Heart:  Regular rate and rhythm, S1 and S2 normal, no murmur, rub or gallop   Breast Exam:  Deferred to GYN   Abdomen:  Soft, nondistended, nontender, normoactive bowel sounds, no masses, no hepatosplenomegaly.  Genitalia:  Deferred to GYN      Extremities:  No clubbing, cyanosis or edema. 2+ PT and DP pulses   Pulses:  2+ and symmetric all extremities   Skin:  Skin color, texture, turgor normal, no rashes or lesions   Lymph nodes:  Cervical, supraclavicular, and axillary nodes normal   Neurologic:  CNII-XII intact, normal strength, sensation and gait; reflexes 2+ and symmetric throughout    Psych:  Normal  mood, affect, hygiene and grooming   ASSESSMENT/PLAN:  Annual physical exam - Plan: Magnesium, Comprehensive metabolic panel, CBC with Differential/Platelet, Lipid panel, POCT Urinalysis DIP (Proadvantage Device)  Pure hypercholesterolemia - reviewed low cholesterol diet - Plan: Lipid panel  Insomnia, unspecified type - controlled with periactin - Plan: cyproheptadine (PERIACTIN) 4 MG tablet  Gastroesophageal reflux disease, esophagitis presence not specified - discussed trial of qod/taper. Can also consider switching PPI to omeprazole (declines). Risks/benefits of longterm PPI vs GERD reviewed in detail - Plan: dexlansoprazole (DEXILANT) 60 MG capsule  Medication monitoring encounter - Plan: Magnesium, Comprehensive metabolic panel, CBC with Differential/Platelet  Depression, major, in remission (HCC)  Leukocytes in urine - no longer with any symptoms.  Has had in past without UTI's, but given recent infection, will send culture - Plan: CULTURE, URINE COMPREHENSIVE   Discussed monthly self breast exams and yearly mammograms; at least 30 minutes of aerobic activity at least 5 days/week, weight-bearing exercise at least 2x/week; proper sunscreen use reviewed; healthy diet, including goals of calcium and vitamin D intake and alcohol recommendations (less than or equal to 1 drink/day) reviewed; regular seatbelt use; changing batteries in smoke detectors. Immunization recommendations discussed--she declines flu shots, encouraged to get yearly. Other vaccines are UTD. Colonoscopy recommendations reviewed--UTD, due again in 06/2023.  F/u 1 year, sooner prn.

## 2018-10-05 ENCOUNTER — Encounter: Payer: Self-pay | Admitting: Family Medicine

## 2018-10-05 ENCOUNTER — Ambulatory Visit (INDEPENDENT_AMBULATORY_CARE_PROVIDER_SITE_OTHER): Payer: BLUE CROSS/BLUE SHIELD | Admitting: Family Medicine

## 2018-10-05 VITALS — BP 104/76 | HR 72 | Ht 66.0 in | Wt 162.4 lb

## 2018-10-05 DIAGNOSIS — E7800 Pure hypercholesterolemia, unspecified: Secondary | ICD-10-CM

## 2018-10-05 DIAGNOSIS — Z0000 Encounter for general adult medical examination without abnormal findings: Secondary | ICD-10-CM

## 2018-10-05 DIAGNOSIS — G4700 Insomnia, unspecified: Secondary | ICD-10-CM

## 2018-10-05 DIAGNOSIS — F325 Major depressive disorder, single episode, in full remission: Secondary | ICD-10-CM

## 2018-10-05 DIAGNOSIS — K219 Gastro-esophageal reflux disease without esophagitis: Secondary | ICD-10-CM | POA: Diagnosis not present

## 2018-10-05 DIAGNOSIS — Z5181 Encounter for therapeutic drug level monitoring: Secondary | ICD-10-CM | POA: Diagnosis not present

## 2018-10-05 DIAGNOSIS — R82998 Other abnormal findings in urine: Secondary | ICD-10-CM

## 2018-10-05 LAB — POCT URINALYSIS DIP (PROADVANTAGE DEVICE)
Bilirubin, UA: NEGATIVE
Blood, UA: NEGATIVE
GLUCOSE UA: NEGATIVE mg/dL
Ketones, POC UA: NEGATIVE mg/dL
Nitrite, UA: NEGATIVE
Protein Ur, POC: NEGATIVE mg/dL
Specific Gravity, Urine: 1.01
UUROB: NEGATIVE
pH, UA: 7 (ref 5.0–8.0)

## 2018-10-05 MED ORDER — CYPROHEPTADINE HCL 4 MG PO TABS: 4.0000 mg | ORAL_TABLET | Freq: Every evening | ORAL | 3 refills | Status: DC | PRN

## 2018-10-05 MED ORDER — DEXLANSOPRAZOLE 60 MG PO CPDR: 1.0000 | DELAYED_RELEASE_CAPSULE | Freq: Every day | ORAL | 3 refills | Status: DC

## 2018-10-05 NOTE — Patient Instructions (Signed)
  HEALTH MAINTENANCE RECOMMENDATIONS:  It is recommended that you get at least 30 minutes of aerobic exercise at least 5 days/week (for weight loss, you may need as much as 60-90 minutes). This can be any activity that gets your heart rate up. This can be divided in 10-15 minute intervals if needed, but try and build up your endurance at least once a week.  Weight bearing exercise is also recommended twice weekly.  Eat a healthy diet with lots of vegetables, fruits and fiber.  "Colorful" foods have a lot of vitamins (ie green vegetables, tomatoes, red peppers, etc).  Limit sweet tea, regular sodas and alcoholic beverages, all of which has a lot of calories and sugar.  Up to 1 alcoholic drink daily may be beneficial for women (unless trying to lose weight, watch sugars).  Drink a lot of water.  Calcium recommendations are 1200-1500 mg daily (1500 mg for postmenopausal women or women without ovaries), and vitamin D 1000 IU daily.  This should be obtained from diet and/or supplements (vitamins), and calcium should not be taken all at once, but in divided doses.  Monthly self breast exams and yearly mammograms for women over the age of 69 is recommended.  Sunscreen of at least SPF 30 should be used on all sun-exposed parts of the skin when outside between the hours of 10 am and 4 pm (not just when at beach or pool, but even with exercise, golf, tennis, and yard work!)  Use a sunscreen that says "broad spectrum" so it covers both UVA and UVB rays, and make sure to reapply every 1-2 hours.  Remember to change the batteries in your smoke detectors when changing your clock times in the spring and fall.  Use your seat belt every time you are in a car, and please drive safely and not be distracted with cell phones and texting while driving.  Today we discussed trying to make your weight routine more cardio, keeping your heart rate up for at least 10-15 minutes at a time (vs adding in additional, separate  cardio).  We also discussed possibly tapering the Dexilant, every other day, vs using just as needed, vs trying a weaker medication such as Omeprazole 40mg  which is less expensive and may also work well.

## 2018-10-06 LAB — CBC WITH DIFFERENTIAL/PLATELET
BASOS ABS: 0 10*3/uL (ref 0.0–0.2)
Basos: 1 %
EOS (ABSOLUTE): 0.1 10*3/uL (ref 0.0–0.4)
EOS: 1 %
HEMOGLOBIN: 14.5 g/dL (ref 11.1–15.9)
Hematocrit: 42 % (ref 34.0–46.6)
IMMATURE GRANS (ABS): 0 10*3/uL (ref 0.0–0.1)
Immature Granulocytes: 0 %
Lymphocytes Absolute: 1.1 10*3/uL (ref 0.7–3.1)
Lymphs: 18 %
MCH: 29.7 pg (ref 26.6–33.0)
MCHC: 34.5 g/dL (ref 31.5–35.7)
MCV: 86 fL (ref 79–97)
MONOCYTES: 7 %
Monocytes Absolute: 0.4 10*3/uL (ref 0.1–0.9)
Neutrophils Absolute: 4.6 10*3/uL (ref 1.4–7.0)
Neutrophils: 73 %
Platelets: 245 10*3/uL (ref 150–450)
RBC: 4.89 x10E6/uL (ref 3.77–5.28)
RDW: 13.9 % (ref 12.3–15.4)
WBC: 6.2 10*3/uL (ref 3.4–10.8)

## 2018-10-06 LAB — COMPREHENSIVE METABOLIC PANEL
ALK PHOS: 72 IU/L (ref 39–117)
ALT: 16 IU/L (ref 0–32)
AST: 20 IU/L (ref 0–40)
Albumin/Globulin Ratio: 1.7 (ref 1.2–2.2)
Albumin: 4.5 g/dL (ref 3.6–4.8)
BILIRUBIN TOTAL: 0.8 mg/dL (ref 0.0–1.2)
BUN/Creatinine Ratio: 13 (ref 12–28)
BUN: 12 mg/dL (ref 8–27)
CHLORIDE: 102 mmol/L (ref 96–106)
CO2: 22 mmol/L (ref 20–29)
CREATININE: 0.96 mg/dL (ref 0.57–1.00)
Calcium: 9.6 mg/dL (ref 8.7–10.3)
GFR calc Af Amer: 74 mL/min/{1.73_m2} (ref >59)
GFR calc non Af Amer: 64 mL/min/{1.73_m2} (ref >59)
GLUCOSE: 78 mg/dL (ref 65–99)
Globulin, Total: 2.7 g/dL (ref 1.5–4.5)
Potassium: 4.8 mmol/L (ref 3.5–5.2)
Sodium: 143 mmol/L (ref 134–144)
TOTAL PROTEIN: 7.2 g/dL (ref 6.0–8.5)

## 2018-10-06 LAB — LIPID PANEL
CHOLESTEROL TOTAL: 220 mg/dL — AB (ref 100–199)
Chol/HDL Ratio: 3.9 ratio (ref 0.0–4.4)
HDL: 56 mg/dL (ref >39)
LDL CALC: 143 mg/dL — AB (ref 0–99)
TRIGLYCERIDES: 107 mg/dL (ref 0–149)
VLDL CHOLESTEROL CAL: 21 mg/dL (ref 5–40)

## 2018-10-06 LAB — MAGNESIUM: MAGNESIUM: 2.3 mg/dL (ref 1.6–2.3)

## 2018-10-08 LAB — CULTURE, URINE COMPREHENSIVE

## 2018-11-02 DIAGNOSIS — Z1389 Encounter for screening for other disorder: Secondary | ICD-10-CM | POA: Diagnosis not present

## 2018-11-02 DIAGNOSIS — Z01419 Encounter for gynecological examination (general) (routine) without abnormal findings: Secondary | ICD-10-CM | POA: Diagnosis not present

## 2018-11-02 DIAGNOSIS — Z130 Encounter for screening for diseases of the blood and blood-forming organs and certain disorders involving the immune mechanism: Secondary | ICD-10-CM | POA: Diagnosis not present

## 2018-11-02 DIAGNOSIS — Z6826 Body mass index (BMI) 26.0-26.9, adult: Secondary | ICD-10-CM | POA: Diagnosis not present

## 2018-12-11 ENCOUNTER — Other Ambulatory Visit: Payer: Self-pay | Admitting: Family Medicine

## 2018-12-11 DIAGNOSIS — F411 Generalized anxiety disorder: Secondary | ICD-10-CM

## 2018-12-11 DIAGNOSIS — F325 Major depressive disorder, single episode, in full remission: Secondary | ICD-10-CM

## 2019-03-02 ENCOUNTER — Encounter: Payer: Self-pay | Admitting: Family Medicine

## 2019-03-02 ENCOUNTER — Ambulatory Visit: Payer: BLUE CROSS/BLUE SHIELD | Admitting: Family Medicine

## 2019-03-02 VITALS — BP 110/70 | HR 80 | Temp 98.7°F | Ht 66.0 in | Wt 164.0 lb

## 2019-03-02 DIAGNOSIS — J069 Acute upper respiratory infection, unspecified: Secondary | ICD-10-CM

## 2019-03-02 NOTE — Progress Notes (Signed)
Chief Complaint  Patient presents with  . Cough    started 2 weeks ago, says she can hear herself breathing at night. Migraine Monday and rarely get these. Vanquish helped. Overseas travel Feb 3-8th. Symptoms started 2 weeks ago. Travelling to [place] this Friday and wants to make sure she can travel.     Illness started 1.5-2 weeks ago (returned from [place] 2/8), with sore throat, PND and cough.  No runny nose or sneezing. Phlegm was discolored.  No known fevers or body aches at that time. Cough has persisted, and is now feeling a little achey and chilled. She has heard wheezing at night, in her chest.  Denies any shortness of breath. Phlegm that she gets up now is clear (and is less often than earlier in the illness).  She is here today due to concern for upcoming travel. Felt hot and cold yesterday (no true chills, didn't feel like she had a fever). Denies fatigue (had some early on).  Has taken Theraflu severe cough (seems to help), Airbone, cough drops (helps). She has some coughing fits, that aren't productive of phlegm.  Theraflu contains acetaminophen, dextromethorphan, guaifenesin (400mg ) and phenylephrine.  Admits to taking it only when she coughing spell, only at home, once or twice daily, and not the full dose. (supposed to be every 4 hours)  She had 1 migraine in January (severe, vomited--didn't have her Vanquish with her). Also had a migraine 2 days ago (didn't vomit). 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  PMH, PSH, SH reviewed  Outpatient Encounter Medications as of 03/02/2019  Medication Sig Note  . Apoaequorin (PREVAGEN PO) Take 1 capsule by mouth daily.   . ASA-APAP-Caff Buffered (VANQUISH PO) Take 2 tablets by mouth as needed (migraine). Reported on 06/16/2016 06/13/2015: Uses prn--very infrequent (a few times/year)  . Biotin 5000 MCG CAPS Take 1 capsule by mouth daily.   Marland Kitchen BLACK CURRANT SEED OIL PO Take  by mouth.   . Calcium Carbonate-Vit D-Min (CALCIUM 1200 PO) Take 1 each by mouth daily.   . cyproheptadine (PERIACTIN) 4 MG tablet Take 1 tablet (4 mg total) by mouth at bedtime as needed. for sleep   . dexlansoprazole (DEXILANT) 60 MG capsule Take 1 capsule (60 mg total) by mouth daily.   Marland Kitchen escitalopram (LEXAPRO) 10 MG tablet TAKE 1 TABLET BY MOUTH ONCE DAILY   . folic acid (FOLVITE) 017 MCG tablet Take 400 mcg by mouth daily.     Marland Kitchen glucosamine-chondroitin 500-400 MG tablet Take 1 tablet by mouth 3 (three) times daily. 06/16/2016: Takes once daily  . Iron-Vitamins (GERITOL PO) Take 1 tablet by mouth.     . loratadine (CLARITIN) 10 MG tablet Take 10 mg by mouth daily.   . Magnesium 500 MG TABS Take 1 tablet by mouth daily.     . Probiotic Product (RESTORA) CAPS Take 1 capsule by mouth daily.     . Turmeric 500 MG TABS Take 500 mg by mouth.   . vitamin D, CHOLECALCIFEROL, 400 UNITS tablet Take 400 Units by mouth daily.   . vitamin E 100 UNIT capsule Take 100 Units by mouth daily.     Marland Kitchen zinc gluconate 50 MG tablet Take 50 mg by mouth daily.     . [DISCONTINUED] finasteride (PROSCAR) 5 MG tablet Take 2.5 mg by mouth. 10/05/2018: Stopped taking last month, didn't seem effective   Facility-Administered Encounter Medications as of 03/02/2019  Medication  .  0.9 %  sodium chloride infusion   Allergies  Allergen Reactions  . Amoxicillin-Pot Clavulanate Diarrhea and Nausea Only    ROS:  No known fevers, recent headache (migraine per HPI), but no other headaches (no sinus pain).  Last week had some ear pain, resolved.  Denies nausea, vomiting, diarrhea (other than with migraine).  No bleeding, bruising, rash. See HPI.   PHYSICAL EXAM:  BP 110/70   Pulse 80   Temp 98.7 F (37.1 C) (Tympanic)   Ht 5\' 6"  (1.676 m)   Wt 164 lb (74.4 kg)   BMI 26.47 kg/m   HEENT: PERRL, EOMI, conjunctiva and sclera are clear. TM's are only partially visualized due to small/curvy canals and some cerumen.   Visualized portion was not erythematous. Nasal mucosa is mild-mod edematous, no purulence or erythema. Sinuses are nontender.  OP is notable for erythema posteriorly, with cobblestoning, and some erythema of L>R anterior tonsillar pillars. Tonsils are normal. Neck: no lymphadenopathy or mass Heart: regular rate and rhythm Lungs: clear bilaterally. No wheezes, rales, or ronchi. No wheeze or cough with forced expiration. Extremities: no edema Skin: normal turgor, no rash Psych: normal mood, affect, hygiene and grooming Neuro: alert and oriented, cranial nerves intact, normal gait   ASSESSMENT/PLAN:  Viral upper respiratory tract infection - supportive measures reviewed, as well as s/sx bacterial infection    Your exam shows nasal congestion, postnasal drainage as cause for your cough. Your lungs are clear. The theraflu that you have would be much more effective in treating your symptoms if you used it every 4-6 hours (has decongestant, expectorant and cough suppressant, as well as tylenol). If you are looking for a longer-acting, non-liquid OTC medication (ie for traveling), you can get sudafed (long-acting if you want, behind the counter) and Mucinex DM 12 hour. You may want to bring Afrin to use on the plane before landing.  There is currently no evidence of any bacterial infection. If your symptoms change--you develop discolored phlegm or nasal drainage, fevers start, or other persisting/worsening symptoms, please contact us for an antibiotic.  If you are having chest pain, pain with breathing or shortness of breath, we would like you evaluated.

## 2019-03-02 NOTE — Patient Instructions (Signed)
  Your exam shows nasal congestion, postnasal drainage as cause for your cough. Your lungs are clear. The theraflu that you have would be much more effective in treating your symptoms if you used it every 4-6 hours (has decongestant, expectorant and cough suppressant, as well as tylenol). If you are looking for a longer-acting, non-liquid OTC medication (ie for traveling), you can get sudafed (long-acting if you want, behind the counter) and Mucinex DM 12 hour. You may want to bring Afrin to use on the plane before landing.  There is currently no evidence of any bacterial infection. If your symptoms change--you develop discolored phlegm or nasal drainage, fevers start, or other persisting/worsening symptoms, please contact us for an antibiotic.  If you are having chest pain, pain with breathing or shortness of breath, we would like you evaluated.

## 2019-04-18 ENCOUNTER — Encounter: Payer: BLUE CROSS/BLUE SHIELD | Admitting: Family Medicine

## 2019-06-20 ENCOUNTER — Encounter: Payer: Self-pay | Admitting: Family Medicine

## 2019-06-20 ENCOUNTER — Emergency Department (HOSPITAL_COMMUNITY)
Admission: EM | Admit: 2019-06-20 | Discharge: 2019-06-20 | Disposition: A | Discharge status: Home / Self Care | Payer: BC Managed Care – PPO | Attending: Emergency Medicine | Admitting: Emergency Medicine

## 2019-06-20 ENCOUNTER — Encounter (HOSPITAL_COMMUNITY): Payer: Self-pay

## 2019-06-20 ENCOUNTER — Ambulatory Visit: Payer: BC Managed Care – PPO | Admitting: Family Medicine

## 2019-06-20 ENCOUNTER — Other Ambulatory Visit: Payer: Self-pay

## 2019-06-20 ENCOUNTER — Emergency Department (HOSPITAL_COMMUNITY): Payer: BC Managed Care – PPO

## 2019-06-20 VITALS — Ht 66.0 in

## 2019-06-20 DIAGNOSIS — K625 Hemorrhage of anus and rectum: Secondary | ICD-10-CM

## 2019-06-20 DIAGNOSIS — R0789 Other chest pain: Secondary | ICD-10-CM

## 2019-06-20 DIAGNOSIS — R079 Chest pain, unspecified: Secondary | ICD-10-CM | POA: Insufficient documentation

## 2019-06-20 DIAGNOSIS — Z5321 Procedure and treatment not carried out due to patient leaving prior to being seen by health care provider: Secondary | ICD-10-CM | POA: Diagnosis not present

## 2019-06-20 DIAGNOSIS — R630 Anorexia: Secondary | ICD-10-CM | POA: Diagnosis not present

## 2019-06-20 DIAGNOSIS — R110 Nausea: Secondary | ICD-10-CM

## 2019-06-20 DIAGNOSIS — R5381 Other malaise: Secondary | ICD-10-CM

## 2019-06-20 DIAGNOSIS — K219 Gastro-esophageal reflux disease without esophagitis: Secondary | ICD-10-CM | POA: Diagnosis not present

## 2019-06-20 LAB — BASIC METABOLIC PANEL
Anion gap: 12 (ref 5–15)
BUN: 11 mg/dL (ref 8–23)
CO2: 22 mmol/L (ref 22–32)
Calcium: 9.5 mg/dL (ref 8.9–10.3)
Chloride: 107 mmol/L (ref 98–111)
Creatinine, Ser: 1.17 mg/dL — ABNORMAL HIGH (ref 0.44–1.00)
GFR calc Af Amer: 58 mL/min — ABNORMAL LOW (ref >60)
GFR calc non Af Amer: 50 mL/min — ABNORMAL LOW (ref >60)
Glucose, Bld: 123 mg/dL — ABNORMAL HIGH (ref 70–99)
Potassium: 3.7 mmol/L (ref 3.5–5.1)
Sodium: 141 mmol/L (ref 135–145)

## 2019-06-20 LAB — CBC
HCT: 42.5 % (ref 36.0–46.0)
Hemoglobin: 14.5 g/dL (ref 12.0–15.0)
MCH: 30.3 pg (ref 26.0–34.0)
MCHC: 34.1 g/dL (ref 30.0–36.0)
MCV: 88.7 fL (ref 80.0–100.0)
Platelets: 231 10*3/uL (ref 150–400)
RBC: 4.79 MIL/uL (ref 3.87–5.11)
RDW: 13.2 % (ref 11.5–15.5)
WBC: 5.6 10*3/uL (ref 4.0–10.5)
nRBC: 0 % (ref 0.0–0.2)

## 2019-06-20 LAB — TROPONIN I: Troponin I: <0.03 ng/mL (ref <0.03)

## 2019-06-20 MED ORDER — SODIUM CHLORIDE 0.9% FLUSH: 3.0000 mL | Freq: Once | INTRAVENOUS | Status: DC

## 2019-06-20 NOTE — Patient Instructions (Signed)
Take dexilant daily for the next 3-5 days (or until you feel back to normal re: appetite and nausea).  Be sure to stay very well hydrated, drink lots of water.  Use OTC hemorrhoid cream (Anusol HC).  If persistent rectal bleeding, recommend further evaluation.  Your symptoms are vague, suggestive of a viral syndrome.  While these aren't the classic symptoms of coronavirus, the symptoms can be vague, so it might be worth getting tested if you have ongoing symptoms.  Try warm compresses and anti-inflammatory for chest wall pain. If you develop more of a rash, or pain that extends around to your back, then we need to think that maybe the red marks on the left chest aren't from your dog scratching you, but could be shingles.  Keep [place] updated, and feel better soon!

## 2019-06-20 NOTE — ED Triage Notes (Signed)
Pt states she has heaviness feeling in her chest and left pain. Pt denies SOB, but states she "feels like I'm hyperventilating". Pt appears to to be in NAD at this time.

## 2019-06-20 NOTE — Progress Notes (Signed)
Start time:2:52 End time: 3:21   Virtual Visit via Video Note  I connected with Laura Kirby on 06/20/19 at  2:45 PM EDT by a video enabled telemedicine application and verified that I am speaking with the correct person using two identifiers.  Location: Patient: at home, alone Provider: office   I discussed the limitations of evaluation and management by telemedicine and the availability of in person appointments. The patient expressed understanding and agreed to proceed.  History of Present Illness:  Chief Complaint  Patient presents with  . Follow-up    from call this weekend to Dr. Redmond School. Was having B/L arm numbess, chest ache, nausea, cold sweats and malaise. Feels somewhat better today. Dr. Redmond School told her to take two tylenol yesterday-she did take a nap afterwards. Also took two today when she got up.   Marland Kitchen Hemorrhoids    having a flare, thinks it started before all of these symptoms which began this weekend.    3 days ago she was very lethargic, malaise.  2 days ago it persisted, but went out to get groceries.  Decreased appetite noted.  Yesterday she got concerned--felt fine when she woke up.  When going to church online she had numbness in upper arms (not extending below elbows) bilaterally, and felt like she was hyperventilating.   She had discomfort in her left upper chest/breast over the weekend. There was an area that was very red, and seems to be improving (thinks her dog Mel Almond may have scratched her).  She noted red marks last week, thought it could have also been related to wearing a bra (first time in a while, since not going out).  She has since noticed an ache in the center of her chest (hurts to press on the center/sternum). Denies any redness/tenderness to this area.  No known tick bites.  She noticed nausea on Sunday, same time as having the numbness in her arms.  This has persisted, though got better after taking Dexilant today. Bowels are normal (were  black after taking Pepto Bismol on Saturday for nausea); had black stool on Sunday, getting more normal in color (somewhat green from eating [place]) today.    She took 2 Tylenol per Dr. Redmond School, took a nap, woke up and felt much better.  Today she states that her appetite isn't back to normal. She has persistent soreness in the center of her chest. She didn't have fever (T 97 on Sunday)  No cough, sore throat Currently denies headaches, dizziness, urinary complaints.  The end of last week, she notice d some BRB after straining for BM, feels like a hemorrhoid flaring. Some BRB this morning.  She is trying to drink more water.  She plans to get hemorrhoid cream from the store today.   PMH, PSH, SH reviewed  Outpatient Encounter Medications as of 06/20/2019  Medication Sig Note  . Apoaequorin (PREVAGEN PO) Take 1 capsule by mouth daily.   . Biotin 5000 MCG CAPS Take 1 capsule by mouth daily.   Marland Kitchen BLACK CURRANT SEED OIL PO Take by mouth.   . Calcium Carbonate-Vit D-Min (CALCIUM 1200 PO) Take 1 each by mouth daily.   . cyproheptadine (PERIACTIN) 4 MG tablet Take 1 tablet (4 mg total) by mouth at bedtime as needed. for sleep   . dexlansoprazole (DEXILANT) 60 MG capsule Take 1 capsule (60 mg total) by mouth daily. 06/20/2019: Takes sporadically, but did take one this morning  . escitalopram (LEXAPRO) 10 MG tablet TAKE 1 TABLET BY MOUTH  ONCE DAILY   . folic acid (FOLVITE) 532 MCG tablet Take 400 mcg by mouth daily.     Marland Kitchen glucosamine-chondroitin 500-400 MG tablet Take 1 tablet by mouth 3 (three) times daily. 06/16/2016: Takes once daily  . Iron-Vitamins (GERITOL PO) Take 1 tablet by mouth.     . loratadine (CLARITIN) 10 MG tablet Take 10 mg by mouth daily.   . Magnesium 500 MG TABS Take 1 tablet by mouth daily.     . Probiotic Product (RESTORA) CAPS Take 1 capsule by mouth daily.     . Turmeric 500 MG TABS Take 500 mg by mouth.   . vitamin D, CHOLECALCIFEROL, 400 UNITS tablet Take 400 Units by mouth  daily.   . vitamin E 100 UNIT capsule Take 100 Units by mouth daily.     Marland Kitchen zinc gluconate 50 MG tablet Take 50 mg by mouth daily.     . ASA-APAP-Caff Buffered (VANQUISH PO) Take 2 tablets by mouth as needed (migraine). Reported on 06/16/2016 06/13/2015: Uses prn--very infrequent (a few times/year)   Facility-Administered Encounter Medications as of 06/20/2019  Medication  . 0.9 %  sodium chloride infusion   Allergies  Allergen Reactions  . Amoxicillin-Pot Clavulanate Diarrhea and Nausea Only   ROS:  Per HPI--no fever, chills, urinary complaints, rash.  +hemorrhoidal bleeding, some straining.  +nausea, decreased appetite. +chest pain per HPI. Currently denies cough, shortness of breath, numbness, tingling, arm complaints. See HPI.   Observations/Objective: Ht 5\' 6"  (1.676 m)   BMI 26.47 kg/m   Well-appearing, pleasant female, in no distress She is alert, oriented, cranial nerves are grossly intact. Normal eye contact, speech, mood. Her skin appears normal, not pale. Her area of tenderness to palpation is in her mid sternum (nontender at costochondral junctions). There is an area (2 spots) of erythema at her left upper chest/breast.  No crusting, erythema or visible swelling.   Exam limited due to virtual nature of the visit.  Assessment and Plan:  Nausea - improved with Dexilant taken today, so GERD may contribute.  Gastroesophageal reflux disease, esophagitis presence not specified - continue Dexilant (just restarted today)  Chest wall pain - tender superficially over sternum. trial warm compress, ibuprofen  Decreased appetite   Malaise - suspect viral syndrome. While she doesn't have classic COVID sx, might be worth testing    Rectal bleeding - BRB after straining, hard stools.  Suspect hemorrhoids, will try OTC cream and f/u if not resolving  Ddx reviewed--viral syndrome, Shingles (doubt, since has no pain extending to back), doubt COVID-19 but a possibility. Arm numbness  may have been related to anxiety and hyperventilating.   Take dexilant daily for the next 3-5 days (or until you feel back to normal re: appetite and nausea).  Be sure to stay very well hydrated, drink lots of water.     Follow Up Instructions:    I discussed the assessment and treatment plan with the patient. The patient was provided an opportunity to ask questions and all were answered. The patient agreed with the plan and demonstrated an understanding of the instructions.   The patient was advised to call back or seek an in-person evaluation if the symptoms worsen or if the condition fails to improve as anticipated.  I provided 29 minutes of non-face-to-face time during this encounter.   Vikki Ports, MD

## 2019-06-20 NOTE — ED Notes (Signed)
Pt states that she does not want to been seen and that her primary already reviewed my chart.

## 2019-06-22 ENCOUNTER — Telehealth: Payer: Self-pay | Admitting: *Deleted

## 2019-06-22 ENCOUNTER — Ambulatory Visit: Payer: Self-pay | Admitting: Family Medicine

## 2019-06-22 NOTE — Telephone Encounter (Signed)
Patient called and states that she was told her EKG was uploaded in to [facility] and she is unable to see it-would like for you to give her the results and she is requesting a phone call back. ( I am assuming from me-she left a message on my vm). Thanks.

## 2019-06-22 NOTE — Telephone Encounter (Signed)
I reviewed EKG that is in the chart.  There were no acute abnormalities to suggest any heart disease (and her troponin blood test confirmed this).  I didn't realize when I emailed her that she wasn't going to complete her evaluation at the ER--I was hoping she would get a physical exam done, since our visit was done virtually (and so couldn't listen to her heart or do any kind of exam).  I hope she is feeling better.

## 2019-06-23 NOTE — Telephone Encounter (Signed)
Patient was informed of provider message.

## 2019-06-24 DIAGNOSIS — Z1159 Encounter for screening for other viral diseases: Secondary | ICD-10-CM | POA: Diagnosis not present

## 2019-06-24 DIAGNOSIS — Z20828 Contact with and (suspected) exposure to other viral communicable diseases: Secondary | ICD-10-CM | POA: Diagnosis not present

## 2019-06-29 ENCOUNTER — Ambulatory Visit: Payer: Self-pay | Admitting: Family Medicine

## 2019-07-11 ENCOUNTER — Encounter: Payer: Self-pay | Admitting: Family Medicine

## 2019-07-21 ENCOUNTER — Encounter: Payer: Self-pay | Admitting: Family Medicine

## 2019-08-05 ENCOUNTER — Telehealth: Payer: Self-pay | Admitting: Family Medicine

## 2019-08-05 NOTE — Telephone Encounter (Signed)
Called pt and advised samples Restora in for her to pick up

## 2019-09-11 ENCOUNTER — Encounter: Payer: Self-pay | Admitting: Family Medicine

## 2019-09-15 ENCOUNTER — Other Ambulatory Visit: Payer: Self-pay | Admitting: Family Medicine

## 2019-09-15 DIAGNOSIS — F411 Generalized anxiety disorder: Secondary | ICD-10-CM

## 2019-09-15 DIAGNOSIS — F325 Major depressive disorder, single episode, in full remission: Secondary | ICD-10-CM

## 2019-10-10 NOTE — Patient Instructions (Addendum)
  HEALTH MAINTENANCE RECOMMENDATIONS:  It is recommended that you get at least 30 minutes of aerobic exercise at least 5 days/week (for weight loss, you may need as much as 60-90 minutes). This can be any activity that gets your heart rate up. This can be divided in 10-15 minute intervals if needed, but try and build up your endurance at least once a week.  Weight bearing exercise is also recommended twice weekly.  Eat a healthy diet with lots of vegetables, fruits and fiber.  "Colorful" foods have a lot of vitamins (ie green vegetables, tomatoes, red peppers, etc).  Limit sweet tea, regular sodas and alcoholic beverages, all of which has a lot of calories and sugar.  Up to 1 alcoholic drink daily may be beneficial for women (unless trying to lose weight, watch sugars).  Drink a lot of water.  Calcium recommendations are 1200-1500 mg daily (1500 mg for postmenopausal women or women without ovaries), and vitamin D 1000 IU daily.  This should be obtained from diet and/or supplements (vitamins), and calcium should not be taken all at once, but in divided doses.  Monthly self breast exams and yearly mammograms for women over the age of 21 is recommended.  Sunscreen of at least SPF 30 should be used on all sun-exposed parts of the skin when outside between the hours of 10 am and 4 pm (not just when at beach or pool, but even with exercise, golf, tennis, and yard work!)  Use a sunscreen that says "broad spectrum" so it covers both UVA and UVB rays, and make sure to reapply every 1-2 hours.  Remember to change the batteries in your smoke detectors when changing your clock times in the spring and fall. Carbon monoxide detectors are recommended for your home.  Use your seat belt every time you are in a car, and please drive safely and not be distracted with cell phones and texting while driving.  Flu shots are recommended yearly.

## 2019-10-10 NOTE — Progress Notes (Signed)
Chief Complaint  Patient presents with  . Annual Exam    fasting annual exam no pap-sees Dr. Terri Piedra. Sees eye doctor ( Dr Macarthur Critchley) for eye exams. No concerns. Depression screen-1  . Flu Vaccine    declined.    Laura Kirby is a 63 y.o. female who presents for a complete physical.  She has the following concerns:  Hyperlipidemia follow-up: Patient is reportedly following a low-fat, low cholesterol diet. Her cholesterol has been controlled by diet.She continues to eat lots of fruits and vegetables. Doesn't eat red meat.  Last year she gave up poultry, and had some increased carb and cheese intake.  She eats a lot of oatmeal, egg white omelets on the weekends (or sauteed swiss chard). She uses creamy salad dressings, only 1x/week (eating fewer salads).  Eating some more processed vegetarian foods (Gardein). Diet hasn't changed since last year.  Admits to more carbs and "comfort foods" during the pandemic--some of the sweets were earlier on (March), better now.  Lab Results  Component Value Date   CHOL 220 (H) 10/05/2018   HDL 56 10/05/2018   LDLCALC 143 (H) 10/05/2018   TRIG 107 10/05/2018   CHOLHDL 3.9 10/05/2018    Depression/anxiety: Very well controlled on her medication. She has not needed to use xanax in years. Denies side effects from Lexapro.Her daughter is doing well (emotionally and financially), which is a huge stress relief for her.  She bought a new house.  She wants to retire, can't afford to do that yet . She is in process of getting divorce--saving up for this, so not yet divorced. May be able to retire once they sell the house they own together.  Having trouble concentrating for the last 2 months--feels more distracted. She started taking and OTC focus supplement (1/day, not the 4 they recommend).  She knows she should keep her cell phone further from her computer to have fewer distractions. CNN is on all day.  She did better when she stopped Facebook x 2 months.   Might do that again.  tik tok brings her laughter.  GERD: Takes Dexilant daily (since "episode" over the summer), with good control of symptoms. Previously had done well not taking it on the weekends, and didn't have any reflux, unless she ate tomato-based food. Did this to help make the pills last longer, and is considering re-trying this, since hse hasn't had any problems in a while. Denies dysphagia.   Headaches:Continues to onlyget headaches infrequently, related to stress, and sometimes seasons. Relieved by Vanquish as needed, only needing a few times/year.   Insomnia: Takes periactin nightly to help with sleep.She has some seasonal allergies, not yet flaring. Claritin is effective when used prn.  Sees dermatologist at [facility] for treatment of alopecia (previously got steroid injections in her scalp, and prescribed finasteride.)  She stopped taking the finasteride last year, didn't see any benefit, but restarted it 2 months ago.  Denies side effects, and is now seeing some growth.  She has also been getting Botox injections there (cosmetic).  She saw Lesia Hausen, NP 09/09/2018 and had booster Josph Macho Treatment procedure. She completed the initial 3 treatments 05/2017. She has had good results. She is scheduled to see Dr. Terri Piedra in November.  Rectal bleeding--she had an old compounded cream which was effective.  Had some hard stools, with BRB on the toilet paper.  Pain with passing the hard stool.  She contacted [place] to refill "hemorrhoid" cream. Wants refill of the compounded cream.  She tried Preparation H without benefit.  Immunization History  Administered Date(s) Administered  . Td 11/17/2005  . Tdap 10/18/2011  . Zoster Recombinat (Shingrix) 09/29/2017, 12/02/2017   Refuses flu shots Last Pap smear: 8/2017with Dr. Dellis Filbert, per pt; last annual exam was with Dr. Terri Piedra in 10/2018 Last mammogram:08/2018, scheduled later this month Last colonoscopy: 06/2018, had tubular  adenoma, 5 year f/u rec Last DEXA: never Dentist: twice a year Ophtho: yearly Exercise: weights, yoga stretches and poses x 30 minutes daily. Added cardio to her home regimen, about 10 minutes. Walks dog 2 miles/day, with lots of stops.  Past Medical History:  Diagnosis Date  . Anemia    history   . Anxiety state, unspecified    on daily xanax for a couple of years in past  . Arthritis    L AC joint/shoulder  . Cataract   . Depression   . Fibroid uterus    GYN--Dr. Terri Piedra  . GERD (gastroesophageal reflux disease)    [facility] at [facility]  . 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 Hyperlipidemia    diet controlled  . Ovarian cyst   . Sickle cell trait [facility])     Past Surgical History:  Procedure Laterality Date  . COLONOSCOPY    . DILITATION & CURRETTAGE/HYSTROSCOPY WITH VERSAPOINT RESECTION N/A 09/02/2013   Procedure: DILATATION & CURETTAGE/HYSTEROSCOPY WITH VERSAPOINT RESECTION;  Surgeon: Princess Bruins, MD;  Location: [facility];  Service: Gynecology;  Laterality: N/A;  . ENDOMETRIAL ABLATION    . MYOMECTOMY     Dr. Dellis Filbert - abdominal myomectomy  . POLYPECTOMY    . PTOSIS REPAIR Bilateral 10/17/2015  . STAPEDES SURGERY Left 01/26/13   at [facility]; otosclerosis; persistent L hearing loss after surgery  . WISDOM TOOTH EXTRACTION  07/2015    Social History   Socioeconomic History  . Marital status: Legally Separated    Spouse name: Not on file  . Number of children: 1  . Years of education: Not on file  . Highest education level: Not on file  Occupational History  . Occupation: works from home.  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 Financial resource strain: Not on file  . Food insecurity    Worry: Not on file    Inability: Not on file  . Transportation needs    Medical: Not on file    Non-medical: Not on file  Tobacco Use  . Smoking status: Never Smoker  .  Smokeless tobacco: Never Used  Substance and Sexual Activity  . Alcohol use: Yes    Alcohol/week: 5.0 standard drinks    Types: 5 Glasses of wine per week    Comment: wine/beer with dinner most days  . Drug use: No  . Sexual activity: Yes    Partners: Male    Birth control/protection: Post-menopausal  Lifestyle  . Physical activity    Days per week: Not on file    Minutes per session: Not on file  . Stress: Not on file  Relationships  . Social Herbalist on phone: Not on file    Gets together: Not on file    Attends religious service: Not on file    Active member of club or organization: Not on file    Attends meetings of clubs or organizations: Not on file    Relationship status: Not on file  . Intimate partner violence  Fear of current or ex partner: Not on file    Emotionally abused: Not on file    Physically abused: Not on file    Forced sexual activity: Not on file  Other Topics Concern  . Not on file  Social History Narrative   Got Ph.D. In 06/2011.     Separated in 2017, in process of getting divorce   Lives with her Schnoodle Mel Almond) puppy 11/2017.   Daughter lives in [place].    Works for [employer] for [employer], doing Oceanographer).  Still teaching online course.    Family History  Problem Relation Age of Onset  . Hyperlipidemia Mother   . Hypertension Mother   . Diabetes Mother   . Dementia Mother   . Irritable bowel syndrome Mother   . Stroke Mother 28  . Alcohol abuse Father   . Kidney disease Father   . Ulcers Father   . Allergies Daughter   . Eczema Daughter   . Cancer Maternal Aunt        breast cancer (60's)  . Cancer Maternal Uncle 70       colon  . Colon cancer Maternal Uncle 110  . Diabetes Maternal Grandfather   . Stroke Maternal Grandfather   . Heart disease Maternal Grandmother   . Cancer Paternal Aunt        ?unsure of type (stomach vs ovarian)  . Colon polyps Neg Hx   . Esophageal cancer Neg Hx   .  Rectal cancer Neg Hx   . Stomach cancer Neg Hx     Outpatient Encounter Medications as of 10/12/2019  Medication Sig Note  . Apoaequorin (PREVAGEN PO) Take 1 capsule by mouth daily.   . Biotin 5000 MCG CAPS Take 1 capsule by mouth daily.   Marland Kitchen BLACK CURRANT SEED OIL PO Take by mouth.   . Calcium Carbonate-Vit D-Min (CALCIUM 1200 PO) Take 1 each by mouth daily.   . cyproheptadine (PERIACTIN) 4 MG tablet Take 1 tablet (4 mg total) by mouth at bedtime as needed. for sleep   . dexlansoprazole (DEXILANT) 60 MG capsule Take 1 capsule (60 mg total) by mouth daily.   Marland Kitchen escitalopram (LEXAPRO) 10 MG tablet Take 1 tablet by mouth once daily   . finasteride (PROSCAR) 5 MG tablet TAKE 1/2 (ONE-HALF) TABLET BY MOUTH ONCE DAILY   . folic acid (FOLVITE) A999333 MCG tablet Take 400 mcg by mouth daily.     Marland Kitchen glucosamine-chondroitin 500-400 MG tablet Take 1 tablet by mouth 3 (three) times daily. 06/16/2016: Takes once daily  . Iron-Vitamins (GERITOL PO) Take 1 tablet by mouth.     . Magnesium 500 MG TABS Take 1 tablet by mouth daily.     . Probiotic Product (RESTORA) CAPS Take 1 capsule by mouth daily.     . Turmeric 500 MG TABS Take 500 mg by mouth.   . vitamin D, CHOLECALCIFEROL, 400 UNITS tablet Take 400 Units by mouth daily.   . vitamin E 100 UNIT capsule Take 100 Units by mouth daily.     Marland Kitchen zinc gluconate 50 MG tablet Take 50 mg by mouth daily.     . ASA-APAP-Caff Buffered (VANQUISH PO) Take 2 tablets by mouth as needed (migraine). Reported on 06/16/2016 06/13/2015: Uses prn--very infrequent (a few times/year)  . loratadine (CLARITIN) 10 MG tablet Take 10 mg by mouth daily.   . [DISCONTINUED] finasteride (PROSCAR) 5 MG tablet TAKE 1 2 (ONE HALF) TABLET BY MOUTH ONCE DAILY  Facility-Administered Encounter Medications as of 10/12/2019  Medication  . 0.9 %  sodium chloride infusion    Allergies  Allergen Reactions  . Amoxicillin-Pot Clavulanate Diarrhea and Nausea Only    ROS: The patient denies fever,  vision changes, weight changes, sore throat, breast concerns, chest pain, palpitations, dizziness, syncope, dyspnea on exertion, cough, swelling, nausea, vomiting, diarrhea, constipation, abdominal pain, melena, hematochezia, indigestion/heartburn, vaginal bleeding, discharge, odor or itch, numbness, tingling, weakness, tremor, suspicious skin lesions.  Norecentrectal bleeding. Last flare in July. Mild L hearing loss, unchanged. Moods are good overall, feeling a little down lately, per HPI, insomnia controlled with medication. She stopped doing overhead weights and no longer has any chest pain. No longer having knee pain or AC pain since using turmeric tea.   PHYSICAL EXAM:  BP 110/64   Pulse 68   Temp (!) 97.5 F (36.4 C) (Other (Comment))   Ht 5\' 6"  (1.676 m)   Wt 169 lb (76.7 kg)   BMI 27.28 kg/m   Wt Readings from Last 3 Encounters:  10/12/19 169 lb (76.7 kg)  03/02/19 164 lb (74.4 kg)  10/05/18 162 lb 6.4 oz (73.7 kg)    General Appearance:  Alert, cooperative, no distress, appears stated age   Head:  Normocephalic, without obvious abnormality, atraumatic   Eyes:  PERRL, conjunctiva/corneas clear, EOM's intact, fundi benign   Ears:  Normal TM's and external ear canals--canals are somewhat narrow, some non-obstructive cerumen is present  Nose:  Not examined, wearing mask due to COVID-19 pandemic   Throat:  Not examined, wearing mask due to COVID-19 pandemic  Neck:  Supple, no lymphadenopathy; thyroid: no enlargement/ tenderness/nodules; no carotid bruit or JVD   Back:  Spine nontender, no curvature, ROM normal, no CVA tenderness   Lungs:  Clear to auscultation bilaterally without wheezes, rales or ronchi; respirations unlabored   Chest Wall:  No tenderness or deformity   Heart:  Regular rate and rhythm, S1 and S2 normal, no murmur, rub or gallop   Breast Exam:  Deferred to GYN   Abdomen:  Soft, nondistended, nontender,  normoactive bowel sounds, no masses, no hepatosplenomegaly.  Genitalia:  Deferred to GYN   Rectal:  Externally appears normal--no hemorrhoids, no fissure.  Some skin hypopigmentation  Extremities:  No clubbing, cyanosis or edema. 2+ PT and DP pulses   Pulses:  2+ and symmetric all extremities   Skin:  Skin color, texture, turgor normal, no rashes or lesions   Lymph nodes:  Cervical, supraclavicular, and axillary nodes normal   Neurologic:  CNII-XII intact, normal strength, sensation and gait; reflexes 2+ and symmetric throughout   Psych: Normal mood, affect, hygiene and grooming  PHQ-9 score of 4   ASSESSMENT/PLAN:  Annual physical exam - Plan: Glucose, random, Lipid panel  Pure hypercholesterolemia - reviewed low cholesterol diet and goals - Plan: Lipid panel  Insomnia, unspecified type - continue periactin  Major depressive disorder in partial remission, unspecified whether recurrent ([place]) - a little worse than in the past, with some contributing anxiety related to world events. Counseled; cont current meds  Anal fissure - intermittent, not currently flaring. Refill diltiazem gel for prn.  - Plan: diltiazem 2 % GEL  Gastroesophageal reflux disease - controlled with Dexilant; may consider changing to qod vs skipping weekends, based on diet - Plan: dexlansoprazole (DEXILANT) 60 MG capsule   Discussed monthly self breast exams and yearly mammograms; at least 30 minutes of aerobic activity at least 5 days/week, weight-bearing exercise at least 2x/week; proper sunscreen  use reviewed; healthy diet, including goals of calcium and vitamin D intake and alcohol recommendations (less than or equal to 1 drink/day) reviewed; regular seatbelt use; changing batteries in smoke detectors. Immunization recommendations discussed--she declines flu shots, encouraged to get yearly. Other vaccines are UTD. Colonoscopy recommendations reviewed--UTD, due again in  06/2023.  F/u 1 year, sooner prn

## 2019-10-12 ENCOUNTER — Ambulatory Visit (INDEPENDENT_AMBULATORY_CARE_PROVIDER_SITE_OTHER): Payer: BC Managed Care – PPO | Admitting: Family Medicine

## 2019-10-12 ENCOUNTER — Other Ambulatory Visit: Payer: Self-pay

## 2019-10-12 ENCOUNTER — Encounter: Payer: Self-pay | Admitting: Family Medicine

## 2019-10-12 VITALS — BP 110/64 | HR 68 | Temp 97.5°F | Ht 66.0 in | Wt 169.0 lb

## 2019-10-12 DIAGNOSIS — Z0000 Encounter for general adult medical examination without abnormal findings: Secondary | ICD-10-CM

## 2019-10-12 DIAGNOSIS — K602 Anal fissure, unspecified: Secondary | ICD-10-CM

## 2019-10-12 DIAGNOSIS — E7800 Pure hypercholesterolemia, unspecified: Secondary | ICD-10-CM

## 2019-10-12 DIAGNOSIS — F324 Major depressive disorder, single episode, in partial remission: Secondary | ICD-10-CM

## 2019-10-12 DIAGNOSIS — K219 Gastro-esophageal reflux disease without esophagitis: Secondary | ICD-10-CM

## 2019-10-12 DIAGNOSIS — G4700 Insomnia, unspecified: Secondary | ICD-10-CM | POA: Diagnosis not present

## 2019-10-12 MED ORDER — DILTIAZEM GEL 2 %: 1.0000 "application " | Freq: Two times a day (BID) | CUTANEOUS | 1 refills | Status: DC

## 2019-10-12 MED ORDER — DEXILANT 60 MG PO CPDR: 1.0000 | DELAYED_RELEASE_CAPSULE | Freq: Every day | ORAL | 3 refills | Status: DC

## 2019-10-13 LAB — LIPID PANEL
Chol/HDL Ratio: 3.9 ratio (ref 0.0–4.4)
Cholesterol, Total: 231 mg/dL — ABNORMAL HIGH (ref 100–199)
HDL: 60 mg/dL (ref >39)
LDL Chol Calc (NIH): 151 mg/dL — ABNORMAL HIGH (ref 0–99)
Triglycerides: 111 mg/dL (ref 0–149)
VLDL Cholesterol Cal: 20 mg/dL (ref 5–40)

## 2019-10-13 LAB — GLUCOSE, RANDOM: Glucose: 88 mg/dL (ref 65–99)

## 2019-10-22 DIAGNOSIS — Z1231 Encounter for screening mammogram for malignant neoplasm of breast: Secondary | ICD-10-CM | POA: Diagnosis not present

## 2019-10-22 LAB — HM MAMMOGRAPHY

## 2019-10-26 ENCOUNTER — Encounter: Payer: Self-pay | Admitting: *Deleted

## 2019-11-01 ENCOUNTER — Other Ambulatory Visit: Payer: Self-pay | Admitting: Family Medicine

## 2019-11-01 DIAGNOSIS — G4700 Insomnia, unspecified: Secondary | ICD-10-CM

## 2019-11-01 NOTE — Telephone Encounter (Signed)
Is this okay to refill? 

## 2019-11-11 DIAGNOSIS — Z6827 Body mass index (BMI) 27.0-27.9, adult: Secondary | ICD-10-CM | POA: Diagnosis not present

## 2019-11-11 DIAGNOSIS — N951 Menopausal and female climacteric states: Secondary | ICD-10-CM | POA: Diagnosis not present

## 2019-11-11 DIAGNOSIS — Z1389 Encounter for screening for other disorder: Secondary | ICD-10-CM | POA: Diagnosis not present

## 2019-11-11 DIAGNOSIS — Z01419 Encounter for gynecological examination (general) (routine) without abnormal findings: Secondary | ICD-10-CM | POA: Diagnosis not present

## 2019-11-30 ENCOUNTER — Ambulatory Visit: Payer: BC Managed Care – PPO | Admitting: Family Medicine

## 2019-11-30 ENCOUNTER — Other Ambulatory Visit: Payer: Self-pay

## 2019-11-30 ENCOUNTER — Encounter: Payer: Self-pay | Admitting: Family Medicine

## 2019-11-30 VITALS — BP 100/60 | HR 88 | Temp 99.6°F | Ht 66.0 in | Wt 169.0 lb

## 2019-11-30 DIAGNOSIS — R102 Pelvic and perineal pain: Secondary | ICD-10-CM

## 2019-11-30 DIAGNOSIS — R519 Headache, unspecified: Secondary | ICD-10-CM | POA: Diagnosis not present

## 2019-11-30 DIAGNOSIS — M545 Low back pain, unspecified: Secondary | ICD-10-CM

## 2019-11-30 DIAGNOSIS — Z9189 Other specified personal risk factors, not elsewhere classified: Secondary | ICD-10-CM | POA: Diagnosis not present

## 2019-11-30 LAB — POCT URINALYSIS DIP (PROADVANTAGE DEVICE)
Blood, UA: NEGATIVE
Glucose, UA: NEGATIVE mg/dL
Ketones, POC UA: NEGATIVE mg/dL
Leukocytes, UA: NEGATIVE
Nitrite, UA: NEGATIVE
Specific Gravity, Urine: 1.03
Urobilinogen, Ur: NEGATIVE
pH, UA: 6 (ref 5.0–8.0)

## 2019-11-30 NOTE — Progress Notes (Signed)
Chief Complaint  Patient presents with  . Back Pain    and lower abdominal discomfort. Started about a week or two after intercourse. No burning or pain with urination.    Last week she started with pain across the lower back on both sides, and some sporadic lower abdominal pain (central/suprapubic).  She is worried that it could either by a UTI or an STI.  No vaginal discharge or odor.  Had slight itch initially, resolved. No dysuria, hematuria, incontinence or other urinary complaints. No urgency/frequency.  She had unprotected intercourse about 2-3 weeks ago.  Bowels are okay--slight strain, but overall no change in bowel habits. She is taking colace 2qHS, which has helped also.  Hasn't picked up refilll of diltiazem cream because she hasn't needed it. (for anal fissure)  PMH, PSH, SH reviewed  Outpatient Encounter Medications as of 11/30/2019  Medication Sig Note  . Apoaequorin (PREVAGEN PO) Take 1 capsule by mouth daily.   . Biotin 5000 MCG CAPS Take 1 capsule by mouth daily.   Marland Kitchen BLACK CURRANT SEED OIL PO Take by mouth.   . Calcium Carbonate-Vit D-Min (CALCIUM 1200 PO) Take 1 each by mouth daily.   . cyproheptadine (PERIACTIN) 4 MG tablet TAKE 1 TABLET BY MOUTH AT BEDTIME AS NEEDED FOR SLEEP   . dexlansoprazole (DEXILANT) 60 MG capsule Take 1 capsule (60 mg total) by mouth daily.   Marland Kitchen escitalopram (LEXAPRO) 10 MG tablet Take 1 tablet by mouth once daily   . finasteride (PROSCAR) 5 MG tablet TAKE 1/2 (ONE-HALF) TABLET BY MOUTH ONCE DAILY   . folic acid (FOLVITE) A999333 MCG tablet Take 400 mcg by mouth daily.     Marland Kitchen glucosamine-chondroitin 500-400 MG tablet Take 1 tablet by mouth 3 (three) times daily. 06/16/2016: Takes once daily  . Magnesium 500 MG TABS Take 1 tablet by mouth daily.     . Probiotic Product (RESTORA) CAPS Take 1 capsule by mouth daily.     . Turmeric 500 MG TABS Take 500 mg by mouth.   . vitamin D, CHOLECALCIFEROL, 400 UNITS tablet Take 400 Units by mouth daily.   Marland Kitchen  zinc gluconate 50 MG tablet Take 50 mg by mouth daily.     . [DISCONTINUED] Iron-Vitamins (GERITOL PO) Take 1 tablet by mouth.     . ASA-APAP-Caff Buffered (VANQUISH PO) Take 2 tablets by mouth as needed (migraine). Reported on 06/16/2016 06/13/2015: Uses prn--very infrequent (a few times/year)  . diltiazem 2 % GEL Apply 1 application topically 2 (two) times daily. (Patient not taking: Reported on 11/30/2019)   . loratadine (CLARITIN) 10 MG tablet Take 10 mg by mouth daily.   . vitamin E 100 UNIT capsule Take 100 Units by mouth daily.      Facility-Administered Encounter Medications as of 11/30/2019  Medication  . 0.9 %  sodium chloride infusion   Allergies  Allergen Reactions  . Amoxicillin-Pot Clavulanate Diarrhea and Nausea Only   ROS: Not aware of any fever or chills (low grade noted in office) Allergies are flaring, taking claritin recently. No cough, shortness of breath, nausea, vomiting, diarrhea.  Nasal drainage is clear, no sinus pain. On/off headache (r/b [place]), thinks sinuses  PHYSICAL EXAM:  BP 100/60   Pulse 88   Temp 99.6 F (37.6 C) (Tympanic)   Ht 5\' 6"  (1.676 m)   Wt 169 lb (76.7 kg)   BMI 27.28 kg/m   Well-appearing, pleasant female, in good spirits, in no distress HEENT: conjunctiva and sclera are clear, EOMI.  Wearing mask Neck: no lymphadenopathy or mass Heart: regular rate and rhythm Lungs: clear bilaterally Back: no spinal or CVA tenderness Abdomen: mildly tender over suprapubic area, nontender, elsewhere. No rebound, guarding or mass.  Urine dip: SG 1.030. Small bili, trace protein. Negative nitrite and leuks  ASSESSMENT/PLAN:  Suprapubic pain - will check urine culture.  No symptoms to suggest infection, dip normal. Increase hydration - Plan: Urine culture  At risk for sexually transmitted disease due to unprotected sex - counseled re: safe sex - Plan: GC/Chlamydia Probe Amp, HIV Antibody, RPR, Hepatitis B surface antigen  Low back pain,  unspecified back pain laterality, unspecified chronicity, unspecified whether sciatica present - Plan: POCT Urinalysis DIP (Proadvantage Device)  Sinus headache - continue allergy meds.  Discussed sinus rinses to help with sinus pressure/headaches.

## 2019-12-01 ENCOUNTER — Encounter: Payer: Self-pay | Admitting: Family Medicine

## 2019-12-01 LAB — RPR: RPR Ser Ql: NONREACTIVE

## 2019-12-01 LAB — GC/CHLAMYDIA PROBE AMP
Chlamydia trachomatis, NAA: NEGATIVE
Neisseria Gonorrhoeae by PCR: NEGATIVE

## 2019-12-01 LAB — HIV ANTIBODY (ROUTINE TESTING W REFLEX): HIV Screen 4th Generation wRfx: NONREACTIVE

## 2019-12-01 LAB — HEPATITIS B SURFACE ANTIGEN: Hepatitis B Surface Ag: NEGATIVE

## 2019-12-02 ENCOUNTER — Other Ambulatory Visit: Payer: Self-pay

## 2019-12-02 ENCOUNTER — Encounter: Payer: Self-pay | Admitting: Family Medicine

## 2019-12-02 DIAGNOSIS — Z20822 Contact with and (suspected) exposure to covid-19: Secondary | ICD-10-CM

## 2019-12-02 LAB — URINE CULTURE

## 2019-12-03 LAB — NOVEL CORONAVIRUS, NAA: SARS-CoV-2, NAA: NOT DETECTED

## 2019-12-12 ENCOUNTER — Other Ambulatory Visit: Payer: Self-pay | Admitting: Family Medicine

## 2019-12-12 DIAGNOSIS — F325 Major depressive disorder, single episode, in full remission: Secondary | ICD-10-CM

## 2019-12-12 DIAGNOSIS — F411 Generalized anxiety disorder: Secondary | ICD-10-CM

## 2020-03-12 ENCOUNTER — Encounter: Payer: Self-pay | Admitting: Family Medicine

## 2020-03-12 DIAGNOSIS — F325 Major depressive disorder, single episode, in full remission: Secondary | ICD-10-CM

## 2020-03-12 DIAGNOSIS — F411 Generalized anxiety disorder: Secondary | ICD-10-CM

## 2020-03-13 ENCOUNTER — Telehealth: Payer: Self-pay

## 2020-03-13 MED ORDER — ESCITALOPRAM OXALATE 10 MG PO TABS: 10.0000 mg | ORAL_TABLET | Freq: Every day | ORAL | 1 refills | Status: DC

## 2020-03-13 NOTE — Telephone Encounter (Signed)
Done through [facility] encounter (pt sent message requesting refill also)

## 2020-03-13 NOTE — Telephone Encounter (Signed)
I received a fax from [facility] Dr. Jule Ser for a refill on escitalopram pt. Last apt was 11/30/19.

## 2020-04-16 ENCOUNTER — Other Ambulatory Visit: Payer: Self-pay

## 2020-04-16 ENCOUNTER — Encounter: Payer: Self-pay | Admitting: Family Medicine

## 2020-04-16 ENCOUNTER — Ambulatory Visit (INDEPENDENT_AMBULATORY_CARE_PROVIDER_SITE_OTHER): Payer: BC Managed Care – PPO | Admitting: Family Medicine

## 2020-04-16 VITALS — BP 102/60 | HR 68 | Temp 96.9°F | Ht 66.0 in | Wt 168.0 lb

## 2020-04-16 DIAGNOSIS — R5383 Other fatigue: Secondary | ICD-10-CM | POA: Diagnosis not present

## 2020-04-16 DIAGNOSIS — H6983 Other specified disorders of Eustachian tube, bilateral: Secondary | ICD-10-CM

## 2020-04-16 DIAGNOSIS — J302 Other seasonal allergic rhinitis: Secondary | ICD-10-CM | POA: Diagnosis not present

## 2020-04-16 NOTE — Patient Instructions (Signed)
Your ear discomfort is most likely related to allergies and eustachian tube dysfunction. Continue the claritin. I recommend using some sudafed if needed for ear discomfort or sinus pain. If you are needing this regularly, and allergies are not adequately controlled with claritin alone, you may want to start a nasal steroid spray such as flonase or Rhinocort or Nasacort, etc.  There is no evidence of any infection.  Eustachian Tube Dysfunction  Eustachian tube dysfunction refers to a condition in which a blockage develops in the narrow passage that connects the middle ear to the back of the nose (eustachian tube). The eustachian tube regulates air pressure in the middle ear by letting air move between the ear and nose. It also helps to drain fluid from the middle ear space. Eustachian tube dysfunction can affect one or both ears. When the eustachian tube does not function properly, air pressure, fluid, or both can build up in the middle ear. What are the causes? This condition occurs when the eustachian tube becomes blocked or cannot open normally. Common causes of this condition include:  Ear infections.  Colds and other infections that affect the nose, mouth, and throat (upper respiratory tract).  Allergies.  Irritation from cigarette smoke.  Irritation from stomach acid coming up into the esophagus (gastroesophageal reflux). The esophagus is the tube that carries food from the mouth to the stomach.  Sudden changes in air pressure, such as from descending in an airplane or scuba diving.  Abnormal growths in the nose or throat, such as: ? Growths that line the nose (nasal polyps). ? Abnormal growth of cells (tumors). ? Enlarged tissue at the back of the throat (adenoids). What increases the risk? You are more likely to develop this condition if:  You smoke.  You are overweight.  You are a child who has: ? Certain birth defects of the mouth, such as cleft palate. ? Large tonsils  or adenoids. What are the signs or symptoms? Common symptoms of this condition include:  A feeling of fullness in the ear.  Ear pain.  Clicking or popping noises in the ear.  Ringing in the ear.  Hearing loss.  Loss of balance.  Dizziness. Symptoms may get worse when the air pressure around you changes, such as when you travel to an area of high elevation, fly on an airplane, or go scuba diving. How is this diagnosed? This condition may be diagnosed based on:  Your symptoms.  A physical exam of your ears, nose, and throat.  Tests, such as those that measure: ? The movement of your eardrum (tympanogram). ? Your hearing (audiometry). How is this treated? Treatment depends on the cause and severity of your condition.  In mild cases, you may relieve your symptoms by moving air into your ears. This is called "popping the ears."  In more severe cases, or if you have symptoms of fluid in your ears, treatment may include: ? Medicines to relieve congestion (decongestants). ? Medicines that treat allergies (antihistamines). ? Nasal sprays or ear drops that contain medicines that reduce swelling (steroids). ? A procedure to drain the fluid in your eardrum (myringotomy). In this procedure, a small tube is placed in the eardrum to:  Drain the fluid.  Restore the air in the middle ear space. ? A procedure to insert a balloon device through the nose to inflate the opening of the eustachian tube (balloon dilation). Follow these instructions at home: Lifestyle  Do not do any of the following until your health care  provider approves: ? Travel to high altitudes. ? Fly in airplanes. ? Work in a Pension scheme manager or room. ? Scuba dive.  Do not use any products that contain nicotine or tobacco, such as cigarettes and e-cigarettes. If you need help quitting, ask your health care provider.  Keep your ears dry. Wear fitted earplugs during showering and bathing. Dry your ears completely  after. General instructions  Take over-the-counter and prescription medicines only as told by your health care provider.  Use techniques to help pop your ears as recommended by your health care provider. These may include: ? Chewing gum. ? Yawning. ? Frequent, forceful swallowing. ? Closing your mouth, holding your nose closed, and gently blowing as if you are trying to blow air out of your nose.  Keep all follow-up visits as told by your health care provider. This is important. Contact a health care provider if:  Your symptoms do not go away after treatment.  Your symptoms come back after treatment.  You are unable to pop your ears.  You have: ? A fever. ? Pain in your ear. ? Pain in your head or neck. ? Fluid draining from your ear.  Your hearing suddenly changes.  You become very dizzy.  You lose your balance. Summary  Eustachian tube dysfunction refers to a condition in which a blockage develops in the eustachian tube.  It can be caused by ear infections, allergies, inhaled irritants, or abnormal growths in the nose or throat.  Symptoms include ear pain, hearing loss, or ringing in the ears.  Mild cases are treated with maneuvers to unblock the ears, such as yawning or ear popping.  Severe cases are treated with medicines. Surgery may also be done (rare). This information is not intended to replace advice given to you by your health care provider. Make sure you discuss any questions you have with your health care provider. Document Revised: 04/06/2018 Document Reviewed: 04/06/2018 Elsevier Patient Education  [place].

## 2020-04-16 NOTE — Progress Notes (Signed)
Chief Complaint  Patient presents with  . Ear Pain    B/L ear pain x 1 week.    She has an ache in both ears, comes and goes.  Currently the discomfort is only on the right. No associated plugging or popping.  Isn't aware of any change in hearing. She is having allergies flare currently.  She is taking Claritin which helps some.  She still some postnasal drainage. Denies discolored mucus or sinus pain.  She tried using some OTC drops in the past for cerumen which didn't help.  She had 2nd COVID vaccine on Saturday, and has felt exhausted since then. She still has some mild body aches.  She fasted last week with church, felt great during the week, but exhausted by the end of the week. (ate only dinner, fasted 6am to 6pm)   PMH, PSH, SH reviewed  Outpatient Encounter Medications as of 04/16/2020  Medication Sig Note  . Apoaequorin (PREVAGEN PO) Take 1 capsule by mouth daily.   . Biotin 5000 MCG CAPS Take 1 capsule by mouth daily.   Marland Kitchen BLACK CURRANT SEED OIL PO Take by mouth.   . Calcium Carbonate-Vit D-Min (CALCIUM 1200 PO) Take 1 each by mouth daily.   . cyproheptadine (PERIACTIN) 4 MG tablet TAKE 1 TABLET BY MOUTH AT BEDTIME AS NEEDED FOR SLEEP 04/16/2020: Uses nightly  . dexlansoprazole (DEXILANT) 60 MG capsule Take 1 capsule (60 mg total) by mouth daily.   Marland Kitchen escitalopram (LEXAPRO) 10 MG tablet Take 1 tablet (10 mg total) by mouth daily.   . finasteride (PROSCAR) 5 MG tablet TAKE 1/2 (ONE-HALF) TABLET BY MOUTH ONCE DAILY   . folic acid (FOLVITE) A999333 MCG tablet Take 400 mcg by mouth daily.     Marland Kitchen glucosamine-chondroitin 500-400 MG tablet Take 1 tablet by mouth 3 (three) times daily. 06/16/2016: Takes once daily  . loratadine (CLARITIN) 10 MG tablet Take 10 mg by mouth daily.   . Magnesium 500 MG TABS Take 1 tablet by mouth daily.     . Probiotic Product (RESTORA) CAPS Take 1 capsule by mouth daily.     . Turmeric 500 MG TABS Take 500 mg by mouth.   . vitamin D, CHOLECALCIFEROL, 400  UNITS tablet Take 400 Units by mouth daily.   . vitamin E 100 UNIT capsule Take 100 Units by mouth daily.     Marland Kitchen zinc gluconate 50 MG tablet Take 50 mg by mouth daily.     . ASA-APAP-Caff Buffered (VANQUISH PO) Take 2 tablets by mouth as needed (migraine). Reported on 06/16/2016 06/13/2015: Uses prn--very infrequent (a few times/year)  . diltiazem 2 % GEL Apply 1 application topically 2 (two) times daily. (Patient not taking: Reported on 11/30/2019)    Facility-Administered Encounter Medications as of 04/16/2020  Medication  . 0.9 %  sodium chloride infusion   Allergies  Allergen Reactions  . Amoxicillin-Pot Clavulanate Diarrhea and Nausea Only   ROS: no fever, chills.  Body aches from vaccine, improved.  +fatigue. No vertigo, tinnitus, hearing loss. No cough, shortness of breath. +PND and allergies. No bleeding, bruising, rash or other complaints.   PHYSICAL EXAM:  BP 102/60   Pulse 68   Temp (!) 96.9 F (36.1 C) (Other (Comment))   Ht 5\' 6"  (1.676 m)   Wt 168 lb (76.2 kg)   BMI 27.12 kg/m   Well-appearing female in no distress. Some throat-clearing noted. HEENT: conjunctiva and sclera are clear, EOMI. Nasal mucosa is mildly edematous, no drainage or erythema. Sinuses  are nontender. Mild cobblestoning noted in posterior OP TM's and EAC's normal bilaterally. Narrow canals.  Only small amount of non-obstructing cerumen on the left. Neck: no lymphadenopathy or mass   ASSESSMENT/PLAN:  Eustachian tube dysfunction, bilateral - sudafed prn. Continue claritin for allergies  Seasonal allergies - continue claritin. Can use sudafed prn. If allergies aren't controlled, can add nasal spray (ie Flonase)  Fatigue, unspecified type - may be contributed by allergies, recent 2nd COVID vaccine, possibly related to her fast. Reassured

## 2020-05-18 ENCOUNTER — Encounter: Payer: Self-pay | Admitting: Medical

## 2020-05-18 ENCOUNTER — Ambulatory Visit: Payer: BC Managed Care – PPO | Admitting: Medical

## 2020-05-18 ENCOUNTER — Encounter: Payer: Self-pay | Admitting: Family Medicine

## 2020-05-18 ENCOUNTER — Other Ambulatory Visit: Payer: Self-pay

## 2020-05-18 VITALS — BP 120/76 | HR 76 | Ht 66.0 in | Wt 168.8 lb

## 2020-05-18 DIAGNOSIS — G5752 Tarsal tunnel syndrome, left lower limb: Secondary | ICD-10-CM | POA: Diagnosis not present

## 2020-05-18 DIAGNOSIS — R202 Paresthesia of skin: Secondary | ICD-10-CM

## 2020-05-18 DIAGNOSIS — R200 Anesthesia of skin: Secondary | ICD-10-CM | POA: Diagnosis not present

## 2020-05-18 NOTE — Progress Notes (Signed)
Subjective: Chief Complaint  Patient presents with  . Numbness    left heel and left calf achy feeling-no injury that she know of-does yoga every morning    Here for left heel numbness.  Bottom of left heel has been numb x 2 weeks.  Feels some tightness in left calf that started today.   Worried about clot. Does yoga, been doing her yoga stretches every morning.  Has soaked foot in epsom salts.  Denies injury, no fall, no trauma.   No bruising.  No swelling.  No other numbness or tingling.  No specific pain.  No hx/o blood clots  No recent long travel, no recent surgery, hospitalization or procedure.  Not on HRT.  No change in activity recently.  She wears socks and is home often, so not wearing shoes as often.   No other aggravating or relieving factors. No other complaint.   Past Medical History:  Diagnosis Date  . Anemia    history   . Anxiety state, unspecified    on daily xanax for a couple of years in past  . Arthritis    L AC joint/shoulder  . Cataract   . Depression   . Fibroid uterus    GYN--Dr. Terri Piedra  . GERD (gastroesophageal reflux disease)    EGD at [facility]  . 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 Hyperlipidemia    diet controlled  . Ovarian cyst   . Sickle cell trait ([place])    Current Outpatient Medications on File Prior to Visit  Medication Sig Dispense Refill  . Apoaequorin (PREVAGEN PO) Take 1 capsule by mouth daily.    . Biotin 5000 MCG CAPS Take 1 capsule by mouth daily.    Marland Kitchen BLACK CURRANT SEED OIL PO Take by mouth.    . Calcium Carbonate-Vit D-Min (CALCIUM 1200 PO) Take 1 each by mouth daily.    . cyproheptadine (PERIACTIN) 4 MG tablet TAKE 1 TABLET BY MOUTH AT BEDTIME AS NEEDED FOR SLEEP 90 tablet 3  . dexlansoprazole (DEXILANT) 60 MG capsule Take 1 capsule (60 mg total) by mouth daily. 90 capsule 3  . diltiazem 2 % GEL Apply 1 application topically 2 (two) times daily. 30 g 1  . escitalopram (LEXAPRO) 10 MG tablet  Take 1 tablet (10 mg total) by mouth daily. 90 tablet 1  . finasteride (PROSCAR) 5 MG tablet TAKE 1/2 (ONE-HALF) TABLET BY MOUTH ONCE DAILY    . folic acid (FOLVITE) A999333 MCG tablet Take 400 mcg by mouth daily.      Marland Kitchen glucosamine-chondroitin 500-400 MG tablet Take 1 tablet by mouth 3 (three) times daily.    Marland Kitchen loratadine (CLARITIN) 10 MG tablet Take 10 mg by mouth daily.    . Magnesium 500 MG TABS Take 1 tablet by mouth daily.      . Probiotic Product (RESTORA) CAPS Take 1 capsule by mouth daily.      . Turmeric 500 MG TABS Take 500 mg by mouth.    . vitamin D, CHOLECALCIFEROL, 400 UNITS tablet Take 400 Units by mouth daily.    . vitamin E 100 UNIT capsule Take 100 Units by mouth daily.      Marland Kitchen zinc gluconate 50 MG tablet Take 50 mg by mouth daily.      . ASA-APAP-Caff Buffered (VANQUISH PO) Take 2 tablets by mouth as needed (migraine). Reported on 06/16/2016     Current Facility-Administered Medications on File Prior to Visit  Medication Dose Route Frequency Provider Last Rate Last Admin  . 0.9 %  sodium chloride infusion  500 mL Intravenous Once Ladene Artist, MD         Past Surgical History:  Procedure Laterality Date  . COLONOSCOPY    . DILITATION & CURRETTAGE/HYSTROSCOPY WITH VERSAPOINT RESECTION N/A 09/02/2013   Procedure: DILATATION & CURETTAGE/HYSTEROSCOPY WITH VERSAPOINT RESECTION;  Surgeon: Princess Bruins, MD;  Location: [facility];  Service: Gynecology;  Laterality: N/A;  . ENDOMETRIAL ABLATION    . MYOMECTOMY     Dr. Dellis Filbert - abdominal myomectomy  . POLYPECTOMY    . PTOSIS REPAIR Bilateral 10/17/2015  . 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 WISDOM TOOTH EXTRACTION  07/2015   ROS as in subjective  Objective: BP 120/76   Pulse 76   Ht 5\' 6"  (1.676 m)   Wt 168 lb 12.8 oz (76.6 kg)   SpO2 98%   BMI 27.25 kg/m   Gen: wd, wn, nad Feet nontender, left foot nontender, no obvious deformity, no swelling, no calf  tenderness swelling or palpable cord, negative Homans.  Ankle and toes with normal range of motion, no pain in the calf or foot with other leg range of motion. DTRs 3+ lower extremity.  No clonus.  Otherwise legs neurovascularly intact   Assessment: Encounter Diagnoses  Name Primary?  . Numbness and tingling of foot Yes  . Tarsal tunnel syndrome of left side       Plan: We discussed her symptoms and concerns.  Her exam seems pretty normal.  Most likely this is tarsal tunnel syndrome.  Advised she wear good supportive shoes more often for now, she can use her short-term over-the-counter NSAID for the next 5 to 7 days, can use ice water therapy 20 minutes on in the evening.  Continue yoga.  Advised that there is no signs or symptoms suggestive of blood clots today.  If not seeing some improvement within the next 2 to 3 weeks follow-up with Dr. Tomi Bamberger, consider other evaluation but reassured today  Laura Kirby was seen today for numbness.  Diagnoses and all orders for this visit:  Numbness and tingling of foot  Tarsal tunnel syndrome of left side

## 2020-05-21 DIAGNOSIS — H40013 Open angle with borderline findings, low risk, bilateral: Secondary | ICD-10-CM | POA: Diagnosis not present

## 2020-08-20 DIAGNOSIS — Z20828 Contact with and (suspected) exposure to other viral communicable diseases: Secondary | ICD-10-CM | POA: Diagnosis not present

## 2020-08-21 DIAGNOSIS — H40013 Open angle with borderline findings, low risk, bilateral: Secondary | ICD-10-CM | POA: Diagnosis not present

## 2020-09-10 ENCOUNTER — Other Ambulatory Visit: Payer: Self-pay | Admitting: Family Medicine

## 2020-09-10 DIAGNOSIS — F411 Generalized anxiety disorder: Secondary | ICD-10-CM

## 2020-09-10 DIAGNOSIS — F325 Major depressive disorder, single episode, in full remission: Secondary | ICD-10-CM

## 2020-09-16 IMAGING — CR CHEST - 2 VIEW
2 series · 2 of 2 positions shown · non-contrast
Comparison: Chest radiograph dated 02/27/2011

CLINICAL DATA: 62-year-old female with chest pain.

EXAM:
CHEST - 2 VIEW

[chest pa]
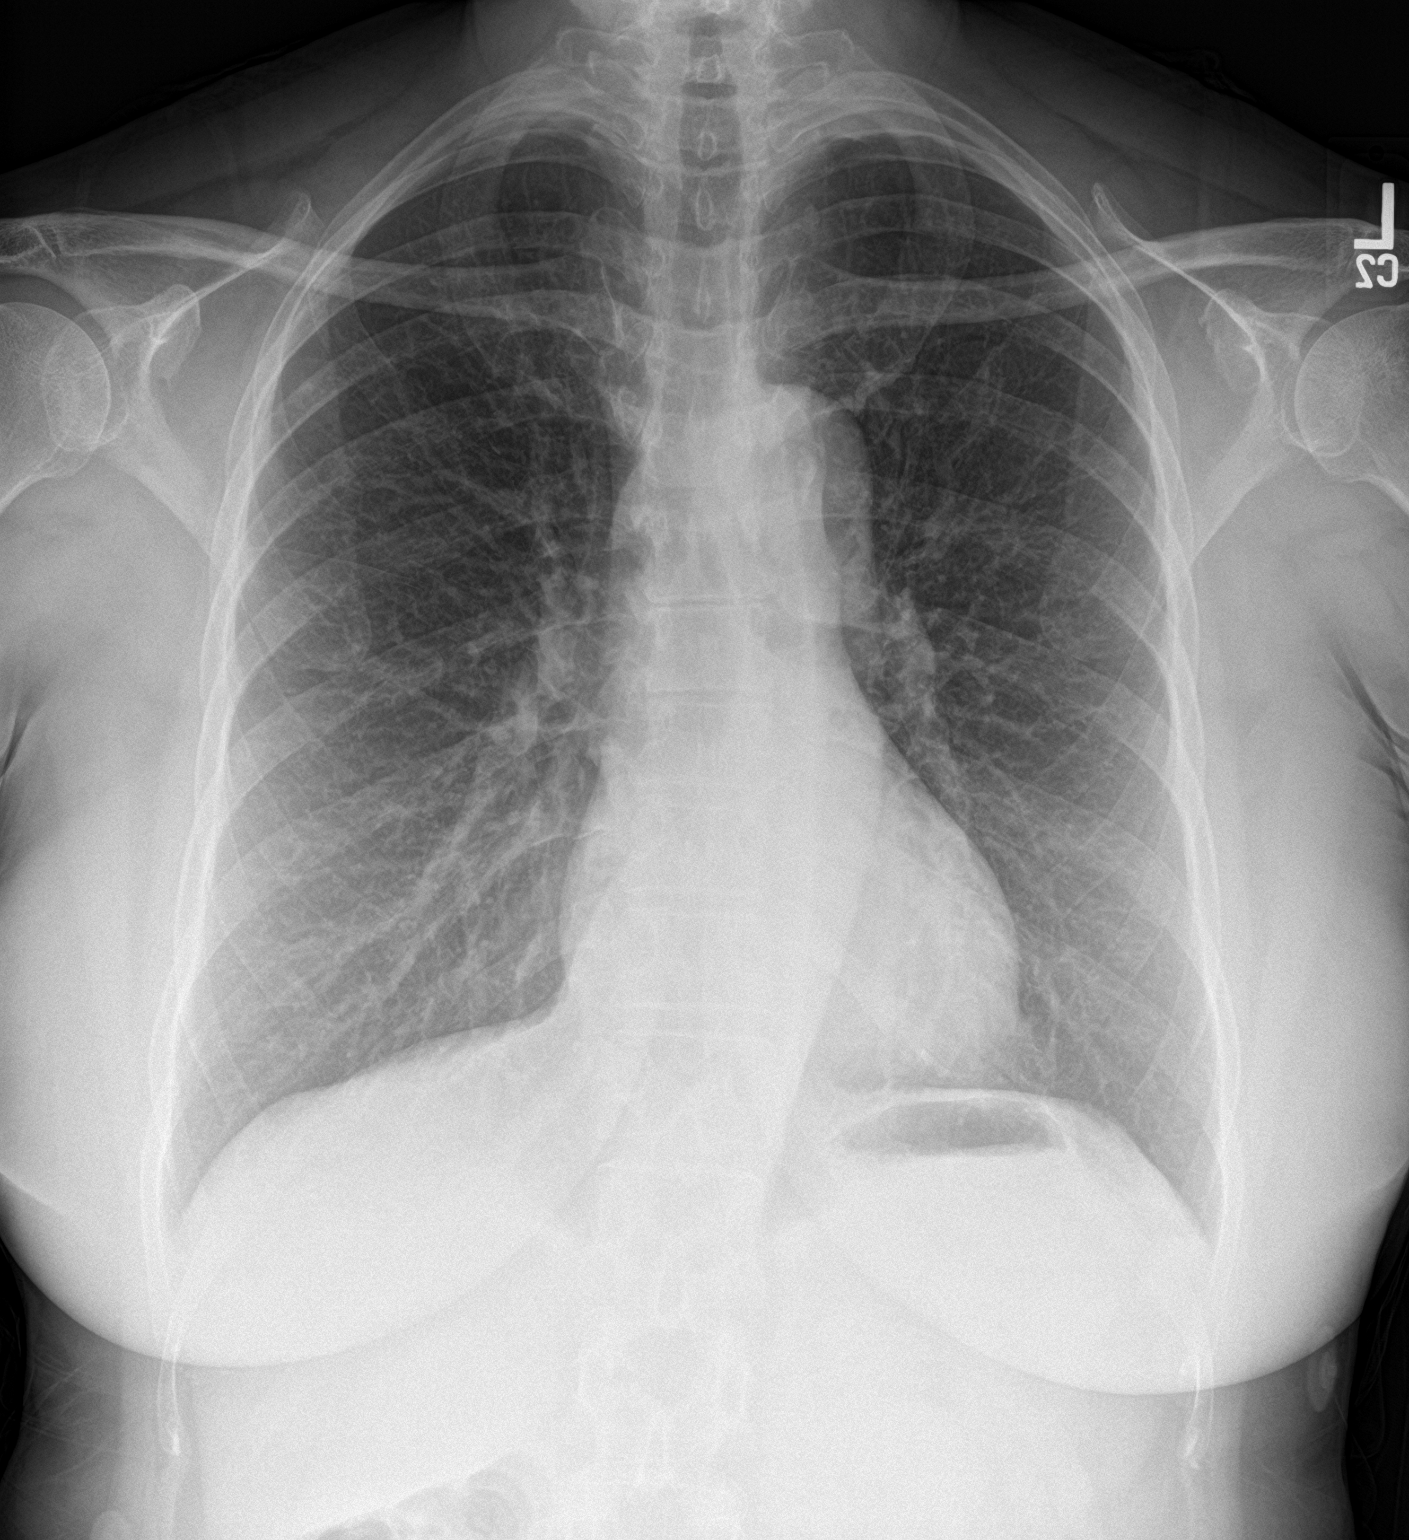

[chest lat]
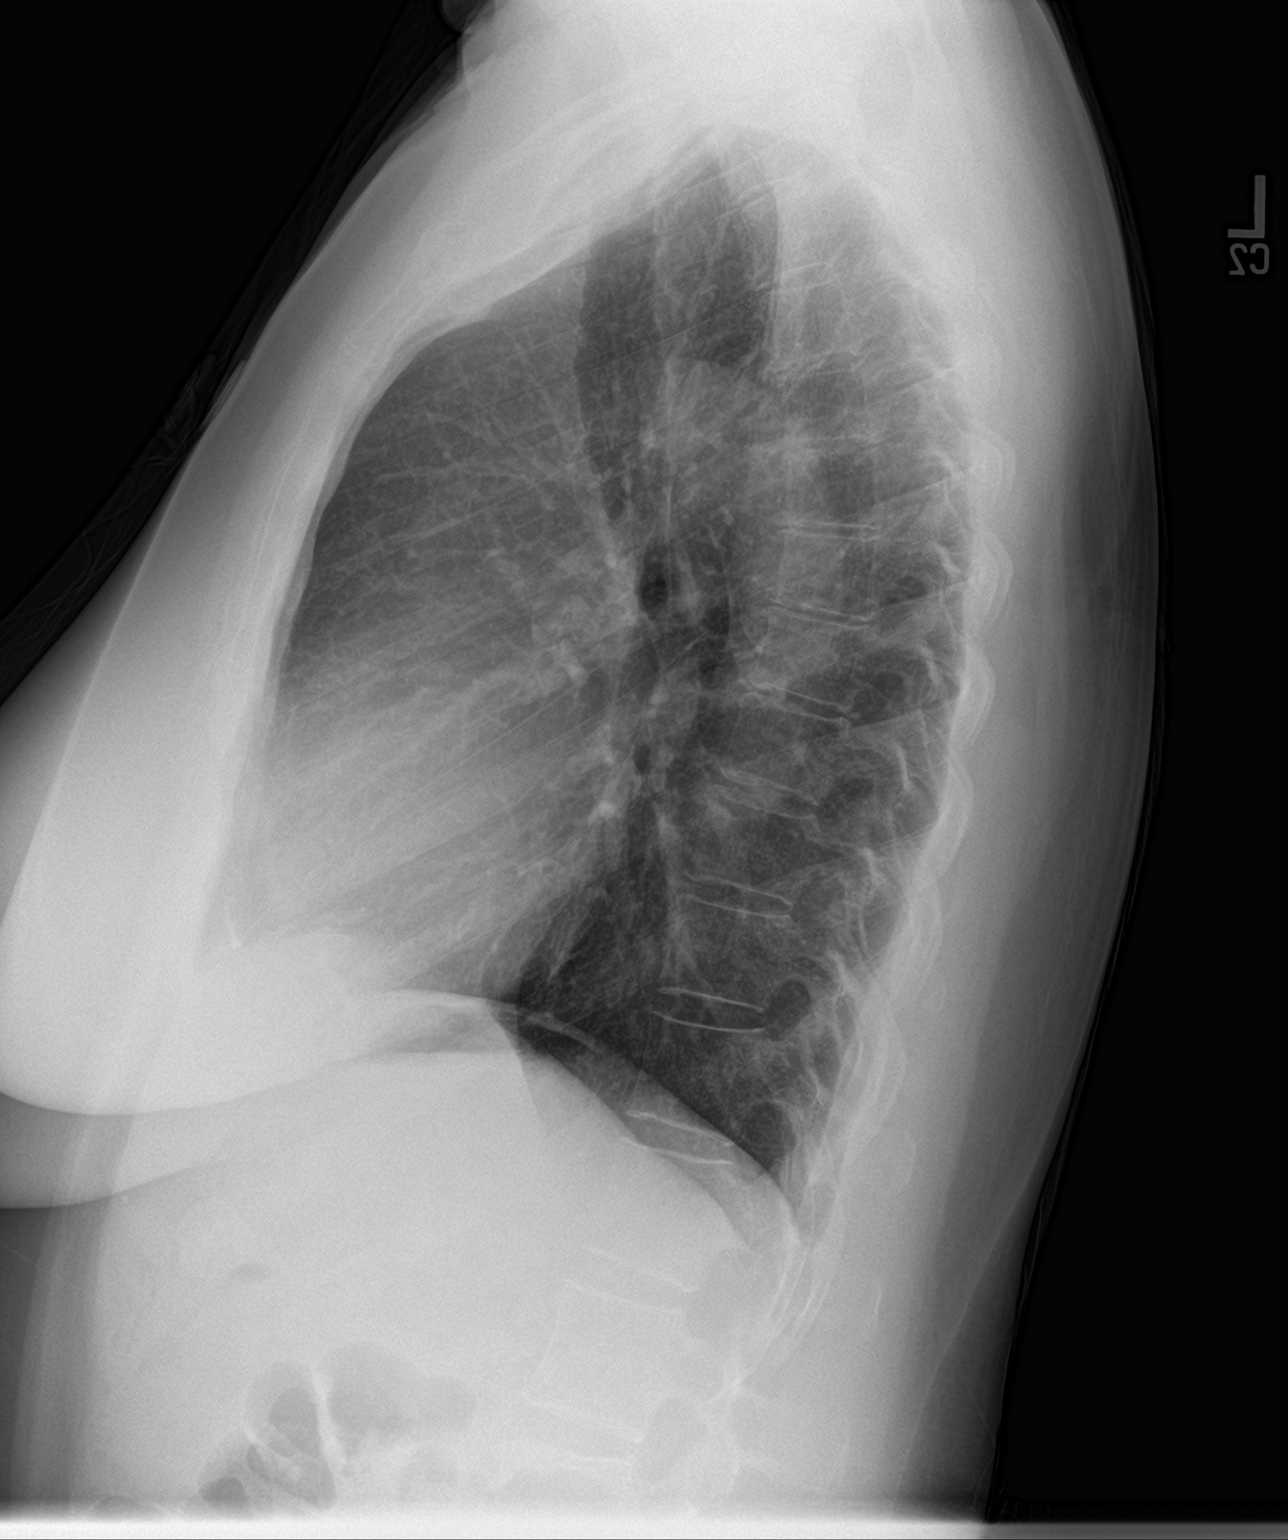

[2 of 2 positions shown; findings below may reference images not displayed]

FINDINGS: The heart size and mediastinal contours are within normal limits.
Both lungs are clear. The visualized skeletal structures are
unremarkable.
IMPRESSION: No active cardiopulmonary disease.

## 2020-09-24 DIAGNOSIS — L658 Other specified nonscarring hair loss: Secondary | ICD-10-CM | POA: Diagnosis not present

## 2020-09-24 DIAGNOSIS — Z79899 Other long term (current) drug therapy: Secondary | ICD-10-CM | POA: Diagnosis not present

## 2020-09-24 DIAGNOSIS — L089 Local infection of the skin and subcutaneous tissue, unspecified: Secondary | ICD-10-CM | POA: Diagnosis not present

## 2020-09-24 DIAGNOSIS — L669 Cicatricial alopecia, unspecified: Secondary | ICD-10-CM | POA: Diagnosis not present

## 2020-09-26 ENCOUNTER — Ambulatory Visit: Payer: BC Managed Care – PPO | Admitting: Family Medicine

## 2020-09-26 ENCOUNTER — Encounter: Payer: Self-pay | Admitting: Family Medicine

## 2020-09-26 ENCOUNTER — Other Ambulatory Visit: Payer: Self-pay

## 2020-09-26 VITALS — BP 124/78 | HR 72 | Ht 66.0 in | Wt 165.4 lb

## 2020-09-26 DIAGNOSIS — M79604 Pain in right leg: Secondary | ICD-10-CM

## 2020-09-26 NOTE — Progress Notes (Signed)
Chief Complaint  Patient presents with  . Leg Pain    for the last week when she goes to bed and first thing in the morning she has severe right leg pain. No warmth, redness or swelling. She thinks she has PAD per Dr. Essie Hart.   Her pain is in the right leg, mainly at the lateral calf, sometimes posterior, sometimes toward the shin as well.  Sometimes the pain is also noted at the lateral hip.  7-10 days ago she went to bed, while trying to fall asleep she had the onset of pain.  Hurts with leg movement, at night only, no pain when the leg is very still. She takes tylenol, which helps get her to sleep and not move, so helps some. She denies any pain with activity.  Notices a slight weakness in the RLE while going up the stairs at work (not having pain at that time); this is intermittent, likely related to her shoes.  She denies numbness/tingling in the RLE. She had some tingling in the past in the L foot (saw Dr. Redmond School for this).  She tried yoga stretches, but not doing these at night.  No pain with the stretches (does on lunch break).  When she first wakes up in the morning, it is hard to put weight on the leg, takes 3-5 minutes.  Then the pain is gone for the rest of the day.  No pain after prolonged sitting at work.  Pain is not crampy. It is "a pain", and is very severe.  She has had some lower back pain (across the lower back). This isn't necessarily new.  PMH, PSH, SH reviewed  Outpatient Encounter Medications as of 09/26/2020  Medication Sig Note  . Apoaequorin (PREVAGEN PO) Take 1 capsule by mouth daily.   . Biotin 5000 MCG CAPS Take 1 capsule by mouth daily.   Marland Kitchen BLACK CURRANT SEED OIL PO Take by mouth.   . Calcium Carbonate-Vit D-Min (CALCIUM 1200 PO) Take 1 each by mouth daily.   . cyproheptadine (PERIACTIN) 4 MG tablet TAKE 1 TABLET BY MOUTH AT BEDTIME AS NEEDED FOR SLEEP 04/16/2020: Uses nightly  . dexlansoprazole (DEXILANT) 60 MG capsule Take 1 capsule (60 mg total) by  mouth daily.   Marland Kitchen diltiazem 2 % GEL Apply 1 application topically 2 (two) times daily. 09/26/2020: Uses prn anal fissure  . escitalopram (LEXAPRO) 10 MG tablet Take 1 tablet by mouth once daily   . finasteride (PROSCAR) 5 MG tablet TAKE 1/2 (ONE-HALF) TABLET BY MOUTH ONCE DAILY   . folic acid (FOLVITE) 542 MCG tablet Take 400 mcg by mouth daily.     Marland Kitchen glucosamine-chondroitin 500-400 MG tablet Take 1 tablet by mouth 3 (three) times daily. 06/16/2016: Takes once daily  . loratadine (CLARITIN) 10 MG tablet Take 10 mg by mouth daily.   . Magnesium 500 MG TABS Take 1 tablet by mouth daily.     . Probiotic Product (RESTORA) CAPS Take 1 capsule by mouth daily.     . Turmeric 500 MG TABS Take 500 mg by mouth.   . vitamin D, CHOLECALCIFEROL, 400 UNITS tablet Take 400 Units by mouth daily.   . vitamin E 100 UNIT capsule Take 100 Units by mouth daily.     Marland Kitchen zinc gluconate 50 MG tablet Take 50 mg by mouth daily.     . [DISCONTINUED] ASA-APAP-Caff Buffered (VANQUISH PO) Take 2 tablets by mouth as needed (migraine). Reported on 06/16/2016 (Patient not taking: Reported on 09/26/2020) 06/13/2015: Uses  prn--very infrequent (a few times/year)   Facility-Administered Encounter Medications as of 09/26/2020  Medication  . 0.9 %  sodium chloride infusion   Allergies  Allergen Reactions  . Amoxicillin-Pot Clavulanate Diarrhea and Nausea Only   ROS: no fever, chills, URI symptoms, cough, shortness of breath, chest pain, GI complaints.  No leg swelling. RLE pain per HPI. Some intermittent LBP.  See HPI   PHYSICAL EXAM:  BP 124/78   Pulse 72   Ht 5\' 6"  (1.676 m)   Wt 165 lb 6.4 oz (75 kg)   BMI 26.70 kg/m   Well-appearing, pleasant female, in no distress HEENT conjunctiva and sclera clear, EOMI, wearing mask Neck: no lymphadenopathy or mass Heart: regular rate and rhythm Lungs: clear bilaterally Back: no spinal or CVA tenderness Extremities:  Had discomfort when laying supine on table with legs  straight Tender to palpation at the superolateral portion of the upper calf, below the level of the popliteal fossa. No cords, masses or other abnormalities noted. 2+ pulses nontender at trochanteric bursa and IT band. FROM at hip and knee  ASSESSMENT/PLAN:  Right leg pain - suspect tendonitis vs muscle strain.  reassured no e/o DVT, PAD. Trial heat, stretches, NSAID. f/u if persists/worsens   Suspect strain vs tendonitis.   Try using heat to the painful area at the outer portion of the upper calf.  15 minutes 2-3 times daily. You can try some massage and definitely work on stretches 3x/day. Take tylenol prior to bed so it doesn't interfere with your sleep as much. You can try Voltaren gel (topical, over-the-counter) to this area.  If you aren't noticing improvement with these measures in a couple of days, then start taking Aleve twice daily for up to a week.  If no effect on pain and no side effects, you can double up to 2 pills twice daily.  Be sure to take the Aleve with food.  Return if increasing pain, calf swelling, redness, or other concerns.  Be sure to drink plenty of fluids, stay well hydrated.  Urine should be very pale/clear.

## 2020-09-26 NOTE — Patient Instructions (Signed)
Try using heat to the painful area at the outer portion of the upper calf.  15 minutes 2-3 times daily. You can try some massage and definitely work on stretches 3x/day. Take tylenol prior to bed so it doesn't interfere with your sleep as much. You can try Voltaren gel (topical, over-the-counter) to this area.  If you aren't noticing improvement with these measures in a couple of days, then start taking Aleve twice daily for up to a week.  If no effect on pain and no side effects, you can double up to 2 pills twice daily.  Be sure to take the Aleve with food.  Return if increasing pain, calf swelling, redness, or other concerns.  Be sure to drink plenty of fluids, stay well hydrated.  Urine should be very pale/clear.

## 2020-09-27 ENCOUNTER — Encounter: Payer: Self-pay | Admitting: Family Medicine

## 2020-09-27 DIAGNOSIS — R252 Cramp and spasm: Secondary | ICD-10-CM

## 2020-09-27 DIAGNOSIS — Z5181 Encounter for therapeutic drug level monitoring: Secondary | ICD-10-CM

## 2020-09-28 ENCOUNTER — Other Ambulatory Visit: Payer: BC Managed Care – PPO

## 2020-09-28 ENCOUNTER — Other Ambulatory Visit: Payer: Self-pay

## 2020-09-28 DIAGNOSIS — R252 Cramp and spasm: Secondary | ICD-10-CM

## 2020-09-28 DIAGNOSIS — Z5181 Encounter for therapeutic drug level monitoring: Secondary | ICD-10-CM | POA: Diagnosis not present

## 2020-09-29 ENCOUNTER — Encounter: Payer: Self-pay | Admitting: Family Medicine

## 2020-09-29 LAB — CBC WITH DIFFERENTIAL/PLATELET
Basophils Absolute: 0 10*3/uL (ref 0.0–0.2)
Basos: 0 %
EOS (ABSOLUTE): 0.1 10*3/uL (ref 0.0–0.4)
Eos: 1 %
Hematocrit: 44.3 % (ref 34.0–46.6)
Hemoglobin: 14.3 g/dL (ref 11.1–15.9)
Immature Grans (Abs): 0 10*3/uL (ref 0.0–0.1)
Immature Granulocytes: 0 %
Lymphocytes Absolute: 1.5 10*3/uL (ref 0.7–3.1)
Lymphs: 20 %
MCH: 29.1 pg (ref 26.6–33.0)
MCHC: 32.3 g/dL (ref 31.5–35.7)
MCV: 90 fL (ref 79–97)
Monocytes Absolute: 0.6 10*3/uL (ref 0.1–0.9)
Monocytes: 8 %
Neutrophils Absolute: 5.3 10*3/uL (ref 1.4–7.0)
Neutrophils: 71 %
Platelets: 201 10*3/uL (ref 150–450)
RBC: 4.91 x10E6/uL (ref 3.77–5.28)
RDW: 13.6 % (ref 11.7–15.4)
WBC: 7.5 10*3/uL (ref 3.4–10.8)

## 2020-09-29 LAB — MAGNESIUM: Magnesium: 2.1 mg/dL (ref 1.6–2.3)

## 2020-09-29 LAB — COMPREHENSIVE METABOLIC PANEL
ALT: 14 IU/L (ref 0–32)
AST: 16 IU/L (ref 0–40)
Albumin/Globulin Ratio: 1.8 (ref 1.2–2.2)
Albumin: 4.8 g/dL (ref 3.8–4.8)
Alkaline Phosphatase: 66 IU/L (ref 44–121)
BUN/Creatinine Ratio: 14 (ref 12–28)
BUN: 16 mg/dL (ref 8–27)
Bilirubin Total: 0.6 mg/dL (ref 0.0–1.2)
CO2: 27 mmol/L (ref 20–29)
Calcium: 10 mg/dL (ref 8.7–10.3)
Chloride: 102 mmol/L (ref 96–106)
Creatinine, Ser: 1.15 mg/dL — ABNORMAL HIGH (ref 0.57–1.00)
GFR calc Af Amer: 59 mL/min/{1.73_m2} — ABNORMAL LOW (ref >59)
GFR calc non Af Amer: 51 mL/min/{1.73_m2} — ABNORMAL LOW (ref >59)
Globulin, Total: 2.7 g/dL (ref 1.5–4.5)
Glucose: 78 mg/dL (ref 65–99)
Potassium: 4.4 mmol/L (ref 3.5–5.2)
Sodium: 141 mmol/L (ref 134–144)
Total Protein: 7.5 g/dL (ref 6.0–8.5)

## 2020-10-22 DIAGNOSIS — Z1231 Encounter for screening mammogram for malignant neoplasm of breast: Secondary | ICD-10-CM | POA: Diagnosis not present

## 2020-10-22 LAB — HM MAMMOGRAPHY

## 2020-10-24 ENCOUNTER — Encounter: Payer: Self-pay | Admitting: *Deleted

## 2020-10-25 ENCOUNTER — Encounter: Payer: Self-pay | Admitting: Family Medicine

## 2020-10-30 NOTE — Progress Notes (Signed)
Chief Complaint  Patient presents with  . Annual Exam    fasting annual exam, has appt with Dr. Terri Piedra 11/23 and is due to have pap, thinks she only had pelvic exam last year. No new concerns.     Laura Kirby is a 64 y.o. female who presents for a complete physical. She is under the care of Dr. Terri Piedra for her GYN care, has appointment later this month.  She was seen 2 months ago with cramps in her RLE.  Lab eval was normal at that time. She increased her fluids and restarted yoga stretches and pain has resolved.  H/o anal fissures causing rectal bleeding. Last flare was 1-2 months ago, resolved without needing the diltiazem gel. Drinking more water has helped.  Has diltiazem gel prescription if needed (hasn't filled).  Hyperlipidemia follow-up: Patient is reportedly following a low-fat, low cholesterol diet. Her cholesterol has been controlled by diet.She reports her diet hasn't changed since last year--she continues to eat lots of fruits and vegetables, is going to the farmer's market regularly. Doesn't eat red meat, poultry or vish (vegetarian), and had some increased carb and cheese intake.  She eats a lot of oatmeal, egg white omelets on the weekends (or sauteed swiss chard). She uses creamy salad dressings, only 1x/week (eating fewer salads).  Eating some more processed vegetarian foods (Gardein). Lab Results  Component Value Date   CHOL 231 (H) 10/12/2019   HDL 60 10/12/2019   LDLCALC 151 (H) 10/12/2019   TRIG 111 10/12/2019   CHOLHDL 3.9 10/12/2019   Depression/anxiety: Very well controlled on her medication. She has not needed to use xanax in years. Denies side effects from Lexapro. Worries some about her daughter (who has had higher blood pressures recently).  She wants to retire, can't afford to do that yet . Some work stress.  She is still in process of getting divorce, thinks she may be able to retire once they sell the house they own together. Concentration has  improved, no longer taking OTC focus supplement.  GERD: Takes Dexilant daily, with good control of symptoms, doesn't take on weekends (unless having foods which will trigger, ie tomatoes).Denies dysphagia.   Headaches:Continues to onlyget headaches infrequently, related to stress, and sometimes seasons.Previously relieved by Vanquish as needed, but no longer available. She is now taking Tylenol prn with good results, only needing a few times/year.   Insomnia: Takes periactin nightly to help with sleep.She has some seasonal allergies, and has been taking claritin daily over the last month with good results.  She saw Lesia Hausen, NP 09/09/2018 and had booster Josph Macho Treatment procedure. She completed the initial 3 treatments 05/2017. She has had good results. This remains good, no vaginal pain.  Scheduled to see Dr. Terri Piedra later this month.   Immunization History  Administered Date(s) Administered  . PFIZER SARS-COV-2 Vaccination 03/24/2020, 04/14/2020  . Td 11/17/2005  . Tdap 10/18/2011  . Zoster Recombinat (Shingrix) 09/29/2017, 12/02/2017   Refuses flu shots Last Pap smear: 8/2017with Dr. Dellis Filbert, per pt; last annual exam was with Dr. Terri Piedra in 10/2019 Last mammogram:09/2020 Last colonoscopy: 06/2018, had tubular adenoma, 5 year f/u rec Last DEXA: never Dentist: twice a year Ophtho: yearly Exercise: Walking the dog (stops a lot, "not really exercise").  Hasn't been doing much in the last couple of months. Typically would do yoga stretches every day x 30 minutes, with some cardio, squats, planks. Resumed yoga stretches when leg started bothering her. Has weights, hasn't been using.   PMH,  PSH, SH and FH were reviewed and updated.  Outpatient Encounter Medications as of 11/01/2020  Medication Sig Note  . Apoaequorin (PREVAGEN PO) Take 1 capsule by mouth daily.   . Biotin 5000 MCG CAPS Take 1 capsule by mouth daily.   Marland Kitchen BLACK CURRANT SEED OIL PO Take by mouth.   . Calcium  Carbonate-Vit D-Min (CALCIUM 1200 PO) Take 1 each by mouth daily.   . cyproheptadine (PERIACTIN) 4 MG tablet TAKE 1 TABLET BY MOUTH AT BEDTIME AS NEEDED FOR SLEEP 04/16/2020: Uses nightly  . dexlansoprazole (DEXILANT) 60 MG capsule Take 1 capsule (60 mg total) by mouth daily. 11/01/2020: Doesn't take on weekends  . escitalopram (LEXAPRO) 10 MG tablet Take 1 tablet by mouth once daily   . finasteride (PROSCAR) 5 MG tablet TAKE 1/2 (ONE-HALF) TABLET BY MOUTH ONCE DAILY   . folic acid (FOLVITE) 233 MCG tablet Take 400 mcg by mouth daily.     Marland Kitchen glucosamine-chondroitin 500-400 MG tablet Take 1 tablet by mouth 3 (three) times daily. 06/16/2016: Takes once daily  . loratadine (CLARITIN) 10 MG tablet Take 10 mg by mouth daily.   . Magnesium 500 MG TABS Take 1 tablet by mouth daily.     . Probiotic Product (RESTORA) CAPS Take 1 capsule by mouth daily.     . Turmeric 500 MG TABS Take 500 mg by mouth.   . vitamin D, CHOLECALCIFEROL, 400 UNITS tablet Take 400 Units by mouth daily.   . vitamin E 100 UNIT capsule Take 100 Units by mouth daily.     Marland Kitchen zinc gluconate 50 MG tablet Take 50 mg by mouth daily.     Marland Kitchen diltiazem 2 % GEL Apply 1 application topically 2 (two) times daily. (Patient not taking: Reported on 09/26/2020) 09/26/2020: Uses prn anal fissure   Facility-Administered Encounter Medications as of 11/01/2020  Medication  . 0.9 %  sodium chloride infusion   Allergies  Allergen Reactions  . Amoxicillin-Pot Clavulanate Diarrhea and Nausea Only    ROS: The patient denies fever, vision changes, weight changes, sore throat, breast concerns, chest pain, palpitations, dizziness, syncope, dyspnea on exertion, cough, swelling, nausea, vomiting, diarrhea, constipation, abdominal pain, melena, hematochezia, indigestion/heartburn, vaginal bleeding, discharge, odor or itch, numbness, tingling, weakness, tremor, suspicious skin lesions.  Norecentrectal bleeding. Last flare in July. Mild L hearing loss,  unchanged. Moods are good overall, feeling a little down lately, possibly related to weather changes/darkness. Insomnia controlled with medication. Denies any joint pains Leg cramps resolved   PHYSICAL EXAM:  BP 120/76   Pulse 80   Ht 5\' 6"  (1.676 m)   Wt 168 lb (76.2 kg)   BMI 27.12 kg/m   Wt Readings from Last 3 Encounters:  11/01/20 168 lb (76.2 kg)  09/26/20 165 lb 6.4 oz (75 kg)  05/18/20 168 lb 12.8 oz (76.6 kg)    General Appearance:  Alert, cooperative, no distress, appears stated age   Head:  Normocephalic, without obvious abnormality, atraumatic   Eyes:  PERRL, conjunctiva/corneas clear, EOM's intact, fundi benign   Ears:  Normal TM's and external ear canals--canals are somewhat narrow, some non-obstructive cerumen is present bilaterally  Nose:  Not examined, wearing mask due to COVID-19 pandemic   Throat:  Not examined, wearing mask due to COVID-19 pandemic  Neck:  Supple, no lymphadenopathy; thyroid: no enlargement/ tenderness/nodules; no carotid bruit or JVD   Back:  Spine nontender, no curvature, ROM normal, no CVA tenderness   Lungs:  Clear to auscultation bilaterally without wheezes, rales  or ronchi; respirations unlabored   Chest Wall:  No tenderness or deformity   Heart:  Regular rate and rhythm, S1 and S2 normal, no murmur, rub or gallop   Breast Exam:  Deferred to GYN   Abdomen:  Soft, nondistended, nontender, normoactive bowel sounds, no masses, no hepatosplenomegaly.  Genitalia:  Deferred to GYN   Rectal:  Deferred to GYN  Extremities:  No clubbing, cyanosis or edema. 2+ PT and DP pulses   Pulses:  2+ and symmetric all extremities   Skin:  Skin color, texture, turgor normal, no rashes or lesions. Mild hyperpigmentation below her eyes bilaterally  Lymph nodes:  Cervical, supraclavicular, and axillary nodes normal   Neurologic:  CNII-XII intact, normal strength, sensation and gait;  reflexes 2+ and symmetric throughout   Psych: Normal mood, affect, hygiene and grooming  PHQ-9 score of 2 Urine dip normal  Lab Results  Component Value Date   WBC 7.5 09/28/2020   HGB 14.3 09/28/2020   HCT 44.3 09/28/2020   MCV 90 09/28/2020   PLT 201 09/28/2020     Chemistry      Component Value Date/Time   NA 141 09/28/2020 1135   K 4.4 09/28/2020 1135   CL 102 09/28/2020 1135   CO2 27 09/28/2020 1135   BUN 16 09/28/2020 1135   CREATININE 1.15 (H) 09/28/2020 1135   CREATININE 1.30 (H) 06/22/2017 0833      Component Value Date/Time   CALCIUM 10.0 09/28/2020 1135   ALKPHOS 66 09/28/2020 1135   AST 16 09/28/2020 1135   ALT 14 09/28/2020 1135   BILITOT 0.6 09/28/2020 1135     Glucose 78 Mg 2.1   ASSESSMENT/PLAN:  Annual physical exam - Plan: POCT Urinalysis DIP (Proadvantage Device), Lipid panel  Anal fissure - less frequent since drinking more water.  Has rx for diltiazem gel to use prn  Depression, major, in remission ([place]) - slight worsening recently--encouraged regular exercise. cont current meds  Pure hypercholesterolemia - due for recheck today. Lowfat, low cholesterol diet reviewed - Plan: Lipid panel  Need for viral immunization - Plan: Pfizer SARS-COV-2 Vaccine  Gastroesophageal reflux disease without esophagitis - controlled with Dexilant, skipping on weekends. Proper diet reviewed - Plan: dexlansoprazole (DEXILANT) 60 MG capsule  Insomnia, unspecified type - controlled with periactin - Plan: cyproheptadine (PERIACTIN) 4 MG tablet  Seasonal allergies - controlled with antihistamine   Discussed monthly self breast exams and yearly mammograms; at least 30 minutes of aerobic activity at least 5 days/week, weight-bearing exercise at least 2x/week; proper sunscreen use reviewed; healthy diet, including goals of calcium and vitamin D intake and alcohol recommendations (less than or equal to 1 drink/day) reviewed; regular seatbelt use;  changing batteries in smoke detectors. Immunization recommendations discussed--she declines flu shots, encouraged to get yearly. COVID booster given today. Other vaccines are UTD. Tetanus booster due next year. Colonoscopy recommendations reviewed--UTD, due again in 06/2023.  F/u 1 year, sooner prn

## 2020-10-30 NOTE — Patient Instructions (Signed)

## 2020-11-01 ENCOUNTER — Encounter: Payer: Self-pay | Admitting: Family Medicine

## 2020-11-01 ENCOUNTER — Ambulatory Visit: Payer: BC Managed Care – PPO | Admitting: Family Medicine

## 2020-11-01 VITALS — BP 120/76 | HR 80 | Ht 66.0 in | Wt 168.0 lb

## 2020-11-01 DIAGNOSIS — K219 Gastro-esophageal reflux disease without esophagitis: Secondary | ICD-10-CM

## 2020-11-01 DIAGNOSIS — Z0000 Encounter for general adult medical examination without abnormal findings: Secondary | ICD-10-CM

## 2020-11-01 DIAGNOSIS — J302 Other seasonal allergic rhinitis: Secondary | ICD-10-CM

## 2020-11-01 DIAGNOSIS — K602 Anal fissure, unspecified: Secondary | ICD-10-CM

## 2020-11-01 DIAGNOSIS — E7800 Pure hypercholesterolemia, unspecified: Secondary | ICD-10-CM

## 2020-11-01 DIAGNOSIS — G4700 Insomnia, unspecified: Secondary | ICD-10-CM

## 2020-11-01 DIAGNOSIS — Z23 Encounter for immunization: Secondary | ICD-10-CM

## 2020-11-01 DIAGNOSIS — F325 Major depressive disorder, single episode, in full remission: Secondary | ICD-10-CM | POA: Diagnosis not present

## 2020-11-01 LAB — POCT URINALYSIS DIP (PROADVANTAGE DEVICE)
Bilirubin, UA: NEGATIVE
Blood, UA: NEGATIVE
Glucose, UA: NEGATIVE mg/dL
Ketones, POC UA: NEGATIVE mg/dL
Leukocytes, UA: NEGATIVE
Nitrite, UA: NEGATIVE
Protein Ur, POC: NEGATIVE mg/dL
Specific Gravity, Urine: 1.015
Urobilinogen, Ur: NEGATIVE
pH, UA: 6 (ref 5.0–8.0)

## 2020-11-01 LAB — LIPID PANEL
Chol/HDL Ratio: 4.1 ratio (ref 0.0–4.4)
Cholesterol, Total: 252 mg/dL — ABNORMAL HIGH (ref 100–199)
HDL: 62 mg/dL (ref >39)
LDL Chol Calc (NIH): 172 mg/dL — ABNORMAL HIGH (ref 0–99)
Triglycerides: 104 mg/dL (ref 0–149)
VLDL Cholesterol Cal: 18 mg/dL (ref 5–40)

## 2020-11-01 MED ORDER — CYPROHEPTADINE HCL 4 MG PO TABS: 4.0000 mg | ORAL_TABLET | Freq: Every evening | ORAL | 3 refills | Status: DC | PRN

## 2020-11-01 MED ORDER — DEXILANT 60 MG PO CPDR: 1.0000 | DELAYED_RELEASE_CAPSULE | Freq: Every day | ORAL | 3 refills | Status: DC

## 2020-11-05 ENCOUNTER — Encounter: Payer: Self-pay | Admitting: Family Medicine

## 2020-11-07 ENCOUNTER — Telehealth: Payer: Self-pay | Admitting: Family Medicine

## 2020-11-07 ENCOUNTER — Other Ambulatory Visit: Payer: Self-pay | Admitting: Family Medicine

## 2020-11-07 DIAGNOSIS — K219 Gastro-esophageal reflux disease without esophagitis: Secondary | ICD-10-CM

## 2020-11-07 NOTE — Telephone Encounter (Signed)
Dr.Knapp filled for #90 with 3 refills on 11/01/20-left message asking patient if she wanted this request for #30 or to disregard.

## 2020-11-07 NOTE — Telephone Encounter (Signed)
Sent!

## 2020-11-07 NOTE — Telephone Encounter (Signed)
Pt called and states that she needs the dexilant sent into pharmacy for 30 days instead of 90.

## 2020-11-19 DIAGNOSIS — Z01419 Encounter for gynecological examination (general) (routine) without abnormal findings: Secondary | ICD-10-CM | POA: Diagnosis not present

## 2020-11-19 DIAGNOSIS — Z6826 Body mass index (BMI) 26.0-26.9, adult: Secondary | ICD-10-CM | POA: Diagnosis not present

## 2020-11-19 DIAGNOSIS — Z124 Encounter for screening for malignant neoplasm of cervix: Secondary | ICD-10-CM | POA: Diagnosis not present

## 2020-11-19 DIAGNOSIS — Z1151 Encounter for screening for human papillomavirus (HPV): Secondary | ICD-10-CM | POA: Diagnosis not present

## 2020-11-20 DIAGNOSIS — Z124 Encounter for screening for malignant neoplasm of cervix: Secondary | ICD-10-CM | POA: Diagnosis not present

## 2020-11-20 DIAGNOSIS — Z1151 Encounter for screening for human papillomavirus (HPV): Secondary | ICD-10-CM | POA: Diagnosis not present

## 2020-11-28 ENCOUNTER — Encounter: Payer: Self-pay | Admitting: Family Medicine

## 2020-12-09 ENCOUNTER — Other Ambulatory Visit: Payer: Self-pay | Admitting: Family Medicine

## 2020-12-09 DIAGNOSIS — F411 Generalized anxiety disorder: Secondary | ICD-10-CM

## 2020-12-09 DIAGNOSIS — F325 Major depressive disorder, single episode, in full remission: Secondary | ICD-10-CM

## 2020-12-11 DIAGNOSIS — R87619 Unspecified abnormal cytological findings in specimens from cervix uteri: Secondary | ICD-10-CM | POA: Diagnosis not present

## 2020-12-11 DIAGNOSIS — N72 Inflammatory disease of cervix uteri: Secondary | ICD-10-CM | POA: Diagnosis not present

## 2020-12-12 LAB — HM PAP SMEAR: HM Pap smear: ABNORMAL

## 2020-12-12 LAB — RESULTS CONSOLE HPV: CHL HPV: POSITIVE

## 2021-01-07 ENCOUNTER — Telehealth: Payer: Self-pay | Admitting: Family Medicine

## 2021-01-07 NOTE — Telephone Encounter (Signed)
Pt called for  samples of Restora. Please call pt when ready.

## 2021-01-07 NOTE — Telephone Encounter (Signed)
Patient advised that samples are ready.

## 2021-03-10 ENCOUNTER — Other Ambulatory Visit: Payer: Self-pay | Admitting: Family Medicine

## 2021-03-10 DIAGNOSIS — F325 Major depressive disorder, single episode, in full remission: Secondary | ICD-10-CM

## 2021-03-10 DIAGNOSIS — F411 Generalized anxiety disorder: Secondary | ICD-10-CM

## 2021-04-22 ENCOUNTER — Encounter: Payer: Self-pay | Admitting: Family Medicine

## 2021-04-27 ENCOUNTER — Encounter: Payer: Self-pay | Admitting: Family Medicine

## 2021-05-02 ENCOUNTER — Telehealth: Payer: Self-pay

## 2021-05-02 NOTE — Telephone Encounter (Signed)
P.A. [place]

## 2021-05-07 NOTE — Telephone Encounter (Signed)
P.A. approved, called pharmacy went thru for $80 & pt picked up

## 2021-05-31 NOTE — Telephone Encounter (Signed)
Dr. Tomi Bamberger, what is the status on the form? Mickel Baas can not find form up front or at [employer].   And Mickel Baas as completed authorization for medication.

## 2021-06-03 NOTE — Telephone Encounter (Signed)
I called pt to advise of needed appt and see if she was available today and she wasn't, but scheduled for this Wednesday.   Form printed for appt

## 2021-06-05 ENCOUNTER — Other Ambulatory Visit: Payer: Self-pay

## 2021-06-05 ENCOUNTER — Encounter: Payer: Self-pay | Admitting: Family Medicine

## 2021-06-05 ENCOUNTER — Ambulatory Visit: Payer: BC Managed Care – PPO | Admitting: Family Medicine

## 2021-06-05 VITALS — BP 114/78 | HR 72 | Ht 66.0 in | Wt 170.0 lb

## 2021-06-05 DIAGNOSIS — F32A Depression, unspecified: Secondary | ICD-10-CM | POA: Diagnosis not present

## 2021-06-05 DIAGNOSIS — F419 Anxiety disorder, unspecified: Secondary | ICD-10-CM | POA: Diagnosis not present

## 2021-06-05 DIAGNOSIS — E7800 Pure hypercholesterolemia, unspecified: Secondary | ICD-10-CM

## 2021-06-05 NOTE — Progress Notes (Signed)
Chief Complaint  Patient presents with  . Form Completion    Patient needs form completed.     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  Her daughter is getting married in September, and she is very excited.  Using exercise bike 15-30 minutes daily  PMH, PSH, SH reviewed  GERD and hyperlipidemia (higher on last check)--she states she had been eating more cheese snacks, and had cut back.  She gained some weight, but will be working on losing (upcoming wedding for daughter).   Outpatient Encounter Medications as of 06/05/2021  Medication Sig Note  . Apoaequorin (PREVAGEN PO) Take 1 capsule by mouth daily.   . Biotin 5000 MCG CAPS Take 1 capsule by mouth daily.   Marland Kitchen BLACK CURRANT SEED OIL PO Take by mouth.   . Calcium Carbonate-Vit D-Min (CALCIUM 1200 PO) Take 1 each by mouth daily.   . cyproheptadine (PERIACTIN) 4 MG tablet Take 1 tablet (4 mg total) by mouth at bedtime as needed. for sleep   . DEXILANT 60 MG capsule Take 1 capsule by mouth once daily   . escitalopram (LEXAPRO) 10 MG tablet Take 1 tablet by mouth once daily   . finasteride (PROSCAR) 5 MG tablet TAKE 1/2 (ONE-HALF) TABLET BY MOUTH ONCE DAILY   . folic acid (FOLVITE) 540 MCG tablet Take 400 mcg by mouth daily.   Marland Kitchen glucosamine-chondroitin 500-400 MG tablet Take 1 tablet by mouth 3 (three) times daily. 06/16/2016: Takes once daily  . loratadine (CLARITIN) 10 MG tablet Take 10 mg by mouth daily.   . Magnesium 500 MG TABS Take 1 tablet by mouth daily.   . Probiotic Product (RESTORA) CAPS Take 1 capsule by mouth daily.   . Turmeric 500 MG TABS Take 500 mg by mouth.   . vitamin D, CHOLECALCIFEROL, 400 UNITS tablet Take 400 Units by mouth daily.   . vitamin E 100 UNIT capsule Take 100 Units by mouth daily.   Marland Kitchen zinc gluconate 50 MG tablet Take 50 mg by mouth daily.   Marland Kitchen diltiazem 2 % GEL Apply 1 application topically 2 (two) times daily. (Patient  not taking: No sig reported) 09/26/2020: Uses prn anal fissure   Facility-Administered Encounter Medications as of 06/05/2021  Medication  . 0.9 %  sodium chloride infusion   Allergies  Allergen Reactions  . Amoxicillin-Pot Clavulanate Diarrhea and Nausea Only    ROS:  No fever, chills, URI symptoms, chest pain, cough, shortness of breath.  Moods are good.  Slight weight gain. Bowels are normal.  GERD controlled with Dexilant (but no coupon available, so got more expensive).   PHYSICAL EXAM:  BP 114/78   Pulse 72   Ht 5\' 6"  (1.676 m)   Wt 170 lb (77.1 kg)   BMI 27.44 kg/m   Wt Readings from Last 3 Encounters:  06/05/21 170 lb (77.1 kg)  11/01/20 168 lb (76.2 kg)  09/26/20 165 lb 6.4 oz (75 kg)   Well-appearing, pleasant female, in no distress HEENT: conjunctiva and sclera are clear, EOMI, wearing mask Neck: no lymphadenopathy or mass Heart: regular rate and rhythm Lungs: clear bilaterally Neuro: alert and oriented, normal gait Psych: normal mood, affect, hygiene and grooming  PHQ-9 score of 0 GAD-7 score of 1  ASSESSMENT/PLAN:  Anxiety and depression - well controlled with lexapro, continue  Pure hypercholesterolemia - Prefers to work on diet a little longer (not recheck now). Discussed CT Cardiac score if  elevated (pt doesn't want statin)   FFO for foster care, no contraindications, negative TB screening questionnaire.

## 2021-07-10 ENCOUNTER — Telehealth: Payer: Self-pay | Admitting: Family Medicine

## 2021-07-10 NOTE — Telephone Encounter (Signed)
Pt called and wants to come in next Tuesday to have her Cholesterol re checked and also get her booster. Is this ok?

## 2021-07-11 ENCOUNTER — Other Ambulatory Visit: Payer: Self-pay | Admitting: Family Medicine

## 2021-07-11 DIAGNOSIS — E7800 Pure hypercholesterolemia, unspecified: Secondary | ICD-10-CM

## 2021-07-11 NOTE — Telephone Encounter (Signed)
Order entered.  Okay for lab visit and booster

## 2021-07-16 ENCOUNTER — Other Ambulatory Visit (INDEPENDENT_AMBULATORY_CARE_PROVIDER_SITE_OTHER): Payer: BC Managed Care – PPO

## 2021-07-16 ENCOUNTER — Other Ambulatory Visit: Payer: Self-pay

## 2021-07-16 DIAGNOSIS — E7800 Pure hypercholesterolemia, unspecified: Secondary | ICD-10-CM

## 2021-07-16 DIAGNOSIS — Z23 Encounter for immunization: Secondary | ICD-10-CM

## 2021-07-17 LAB — LIPID PANEL
Chol/HDL Ratio: 4.3 ratio (ref 0.0–4.4)
Cholesterol, Total: 228 mg/dL — ABNORMAL HIGH (ref 100–199)
HDL: 53 mg/dL (ref >39)
LDL Chol Calc (NIH): 157 mg/dL — ABNORMAL HIGH (ref 0–99)
Triglycerides: 101 mg/dL (ref 0–149)
VLDL Cholesterol Cal: 18 mg/dL (ref 5–40)

## 2021-09-07 ENCOUNTER — Other Ambulatory Visit: Payer: Self-pay | Admitting: Family Medicine

## 2021-09-07 DIAGNOSIS — F325 Major depressive disorder, single episode, in full remission: Secondary | ICD-10-CM

## 2021-09-07 DIAGNOSIS — F411 Generalized anxiety disorder: Secondary | ICD-10-CM

## 2021-10-22 ENCOUNTER — Encounter: Payer: Self-pay | Admitting: Family Medicine

## 2021-10-23 DIAGNOSIS — Z1231 Encounter for screening mammogram for malignant neoplasm of breast: Secondary | ICD-10-CM | POA: Diagnosis not present

## 2021-10-23 LAB — HM MAMMOGRAPHY

## 2021-11-05 ENCOUNTER — Encounter: Payer: Self-pay | Admitting: Internal Medicine

## 2021-11-10 NOTE — Patient Instructions (Addendum)
  HEALTH MAINTENANCE RECOMMENDATIONS:  It is recommended that you get at least 30 minutes of aerobic exercise at least 5 days/week (for weight loss, you may need as much as 60-90 minutes). This can be any activity that gets your heart rate up. This can be divided in 10-15 minute intervals if needed, but try and build up your endurance at least once a week.  Weight bearing exercise is also recommended twice weekly.  Eat a healthy diet with lots of vegetables, fruits and fiber.  "Colorful" foods have a lot of vitamins (ie green vegetables, tomatoes, red peppers, etc).  Limit sweet tea, regular sodas and alcoholic beverages, all of which has a lot of calories and sugar.  Up to 1 alcoholic drink daily may be beneficial for women (unless trying to lose weight, watch sugars).  Drink a lot of water.  Calcium recommendations are 1200-1500 mg daily (1500 mg for postmenopausal women or women without ovaries), and vitamin D 1000 IU daily.  This should be obtained from diet and/or supplements (vitamins), and calcium should not be taken all at once, but in divided doses.  Monthly self breast exams and yearly mammograms for women over the age of 16 is recommended.  Sunscreen of at least SPF 30 should be used on all sun-exposed parts of the skin when outside between the hours of 10 am and 4 pm (not just when at beach or pool, but even with exercise, golf, tennis, and yard work!)  Use a sunscreen that says "broad spectrum" so it covers both UVA and UVB rays, and make sure to reapply every 1-2 hours.  Remember to change the batteries in your smoke detectors when changing your clock times in the spring and fall. Carbon monoxide detectors are recommended for your home.  Use your seat belt every time you are in a car, and please drive safely and not be distracted with cell phones and texting while driving.  We are referring you for coronary CT calcium scan.  Please consider trying to taper back on the Dexilant use  during the week to see if you truly need it (skip 1 day, and if doing well, you can try every other day, and possibly even taper to just as needed use, prior to meals which will trigger). The goal is to stay on the lowest effective dose, and potentially transitioning either to a less expensive medication, and/or and over-the-counter dose (prilosec OTC), or even to a safer medication such as famotidine (Pepcid)  I suspect you strained your rotator cuff (subscapularis). Avoid weights.  Do the range of motion exercises we discussed. When it is feeling better, you can resume exercise, but at a lower weight. You may try an anti-inflammatory for 7-10 days (Aleve or ibuprofen)--take the Dexilant every day while on the anti-inflammatory medication.  Let us know if you want a physical therapy referral, if not improving.

## 2021-11-10 NOTE — Progress Notes (Signed)
Chief Complaint  Patient presents with   Annual Exam    Fasting annual exam no pap, no concerns. Wil take covid booster today and will come bacfor [place] for Tdap. Has appt with Dr. Terri Piedra on 11/19/21.    Laura Kirby is a 65 y.o. female who presents for a complete physical. She is under the care of Dr. Terri Piedra for her GYN care. She had question about her mammogram--does she need [place] due to dense breasts? She had been talking about this with her friends. She notes that her breasts are smaller. She lost weight for her daughter's wedding, has since regained some. We reviewed mammograms together--[facility] doesn't put the density grade, reported "scattered fibroglandular densities", didn't comment on dense breast.  Prior mammogram (2021) reported that breast tissue was almost entirely fatty. She lost weight for the wedding, but has since regained some. She has some L shoulder pain (was doing more weights, 10#).    She is in a sexual relationship currently.  Wanting STD testing from blood (will get others from GYN). She thinks she has a UTI related to intercourse.  Last week she had frequent urination, some discomfort in her lower abdomen, vaginal itching.  She used OTC Cystex x 3 days, last dose was 11/11.  Symptoms haven't recurred.  She is complaining of L shoulder pain.  This started after she started doing exercises using 10 pound weights.  She has since stopped doing the exercises, but still has discomfort on the left.  H/o anal fissures causing rectal bleeding. Drinking more water has helped prevent flares.  Has used diltiazem gel in the past, never filled the last prescription. She uses OTC meds prn, which works well.  Flares every 2 months, when constipated.  She tries to drink lots of water, but admits diet hasn't been ideal.  Hyperlipidemia follow-up:  She admits that her diet hasn't been great for the lats 2 months.  Patient usually tries to follow a low-fat, low cholesterol diet. Her  cholesterol has been controlled by diet. Cholesterol had gone up last year (LDL 172), had been eating more cheese. She cut back, and recheck in July got LDL to <160 again.  She prefers to avoid use of statins.  We previously discussed CT Cardiac score to further assess cardiac risk if lipids remained elevated.  Currently she reports not doing as well with her fruit and vegetables as she was at her last visit. She is still vegetarian.  She cut back on cheese.   She eats a lot of oatmeal, egg white omelets on the weekends (or sauteed swiss chard), eats a boiled egg 2x/week. Not eating as much salads in the colder weather.    Component Ref Range & Units 3 mo ago 1 yr ago 2 yr ago 3 yr ago 4 yr ago 5 yr ago 6 yr ago  Cholesterol, Total 100 - 199 mg/dL 228 High   252 High   231 High   220 High       Triglycerides 0 - 149 mg/dL 101  104  111  107  88 R  101 R  90 R   HDL >39 mg/dL 53  62  60  56  59 R  50 R  59 R, CM   VLDL Cholesterol Cal 5 - 40 mg/dL 18  18  20  21       LDL Chol Calc (NIH) 0 - 99 mg/dL 157 High   172 High   151 High  Chol/HDL Ratio 0.0 - 4.4 ratio 4.3  4.1 CM  3.9 CM  3.9 CM  3.6 R  4.2 R  3.6 R       ASCVD 10 year risk based on today's BP with July's labs was 8.5% (intermediate) Based on 10/2020 BP and lipid values, ASCVD risk last year was 12.9% (intermediate)  Depression/anxiety: Very well controlled on her medication. She has not needed to use xanax in years. Denies side effects from Lexapro.   She had some depressed moods for about 2 weeks after her daughter's wedding, doing better now.  Short-term foster parent--meeting with SW today.  Hasn't had any fosters yet.  GERD: Takes Dexilant daily, with good control of symptoms, doesn't take on weekends (unless having foods which will trigger, ie tomatoes). Denies dysphagia. Unsure if this will be covered next year (has been getting prior auth).   Headaches: Continues to only get headaches infrequently, related to stress,  and sometimes seasons. Previously relieved by Vanquish as needed, but no longer available. She uses Tylenol prn with good results, only needing a few times/year.  She continues to do well in this regard.   Insomnia: Takes periactin nightly to help with sleep. She has some seasonal allergies, and uses claritin as needed, not recently.  Immunization History  Administered Date(s) Administered   PFIZER Comirnaty(Gray Top)Covid-19 Tri-Sucrose Vaccine 07/16/2021   PFIZER(Purple Top)SARS-COV-2 Vaccination 03/24/2020, 04/14/2020, 11/01/2020   Pfizer Covid-19 Vaccine Bivalent Booster 65yrs & up 11/11/2021   Td 11/17/2005   Tdap 10/18/2011   Zoster Recombinat (Shingrix) 09/29/2017, 12/02/2017   Refuses flu shots Last Pap smear:  07/2016 with Dr. Dellis Filbert, per pt; sees Dr. Terri Piedra yearly Last mammogram: 09/2021 Last colonoscopy:  06/2018, had tubular adenoma, 5 year f/u rec Last DEXA: never Dentist: twice a year Ophtho: yearly Exercise:  Exercise bike 2x/week for 30 mins.  Does squats and situps every morning (cardio), 15 minutes daily.  Had been doing UE weights, recently, stopped when she injured L shoulder (was doing 3x/week). Walking the dog (stops a lot, "not really exercise").   Yoga stretches   Vitamin D level was normal at 48 in 2012. She denies changes in vitamins  PMH, PSH, SH and FH were reviewed and updated.  Outpatient Encounter Medications as of 11/11/2021  Medication Sig Note   Apoaequorin (PREVAGEN PO) Take 1 capsule by mouth daily.    Biotin 5000 MCG CAPS Take 1 capsule by mouth daily.    BLACK CURRANT SEED OIL PO Take by mouth.    Calcium Carbonate-Vit D-Min (CALCIUM 1200 PO) Take 1 each by mouth daily.    cyproheptadine (PERIACTIN) 4 MG tablet Take 1 tablet (4 mg total) by mouth at bedtime as needed. for sleep    DEXILANT 60 MG capsule Take 1 capsule by mouth once daily    escitalopram (LEXAPRO) 10 MG tablet Take 1 tablet by mouth once daily    folic acid (FOLVITE) 517 MCG  tablet Take 400 mcg by mouth daily.    glucosamine-chondroitin 500-400 MG tablet Take 1 tablet by mouth 3 (three) times daily. 06/16/2016: Takes once daily   Magnesium 500 MG TABS Take 1 tablet by mouth daily.    Probiotic Product (RESTORA) CAPS Take 1 capsule by mouth daily.    Turmeric 500 MG TABS Take 500 mg by mouth.    vitamin D, CHOLECALCIFEROL, 400 UNITS tablet Take 400 Units by mouth daily.    vitamin E 100 UNIT capsule Take 100 Units by mouth daily.  zinc gluconate 50 MG tablet Take 50 mg by mouth daily.    loratadine (CLARITIN) 10 MG tablet Take 10 mg by mouth daily. (Patient not taking: Reported on 11/11/2021) 11/11/2021: As needed   [DISCONTINUED] diltiazem 2 % GEL Apply 1 application topically 2 (two) times daily. (Patient not taking: No sig reported) 09/26/2020: Uses prn anal fissure   [DISCONTINUED] finasteride (PROSCAR) 5 MG tablet TAKE 1/2 (ONE-HALF) TABLET BY MOUTH ONCE DAILY    Facility-Administered Encounter Medications as of 11/11/2021  Medication   0.9 %  sodium chloride infusion   Allergies  Allergen Reactions   Amoxicillin-Pot Clavulanate Diarrhea and Nausea Only    ROS: The patient denies fever, vision changes, weight changes, sore throat, breast concerns, chest pain, palpitations, dizziness, syncope, dyspnea on exertion, cough, swelling, nausea, vomiting, diarrhea, constipation, abdominal pain, melena, hematochezia, indigestion/heartburn.  Denies numbness, tingling, weakness, tremor, suspicious skin lesions.    No current UTI symptoms, but recent, per HPI.  Slight vaginal itch.  Had some spotting after intercourse (will d/w GYN), didn't use lubricant. Mild L hearing loss, unchanged. Turning volume up more in general. Thinks there may be wax. Moods are good, insomnia controlled with medication. Denies any leg cramps. L shoulder pain started recently after using 10# weights.   PHYSICAL EXAM:  BP 130/80   Pulse 72   Ht 5\' 5"  (1.651 m)   Wt 170 lb (77.1 kg)    BMI 28.29 kg/m   Wt Readings from Last 3 Encounters:  11/11/21 170 lb (77.1 kg)  06/05/21 170 lb (77.1 kg)  11/01/20 168 lb (76.2 kg)    General Appearance:    Alert, cooperative, no distress, appears stated age     Head:    Normocephalic, without obvious abnormality, atraumatic     Eyes:    PERRL, conjunctiva/corneas clear, EOM's intact, fundi benign     Ears:    Normal TM's and external ear canals--canals are somewhat narrow, some non-obstructive cerumen is present on the left, clear on the right  Nose:    Not examined, wearing mask due to COVID-19 pandemic     Throat:    Not examined, wearing mask due to COVID-19 pandemic  Neck:    Supple, no lymphadenopathy; thyroid: no enlargement/ tenderness/nodules; no carotid bruit or JVD     Back:    Spine nontender, no curvature, ROM normal, no CVA tenderness    Lungs:    Clear to auscultation bilaterally without wheezes, rales or ronchi; respirations unlabored     Chest Wall:    No tenderness or deformity     Heart:    Regular rate and rhythm, S1 and S2 normal, no murmur, rub or gallop     Breast Exam:    Deferred to GYN     Abdomen:    Soft, nondistended, nontender, normoactive bowel sounds, no masses, no hepatosplenomegaly.  Genitalia:    Deferred to GYN     Rectal:    Deferred to GYN  Extremities:    No clubbing, cyanosis or edema. 2+ PT and DP pulses. See below for shoulder exam     Pulses:    2+ and symmetric all extremities     Skin:    Skin color, texture, turgor normal, no rashes or lesions.   Lymph nodes:    Cervical, supraclavicular nodes normal     Neurologic:    Normal strength, sensation and gait; reflexes 2+ and symmetric throughout  Psych:  Normal mood, affect, hygiene and grooming   Shoulders: FROM (abduction, forward flexion, internal rotation), but with some discomfort on the left.  She has full strength throughout, but noted to have discomfort with subscapularis testing against  resistance.  Depression screen [facility] 2/9 11/11/2021 06/05/2021 06/05/2021 11/01/2020 11/01/2020  Decreased Interest 0 0 0 0 0  Down, Depressed, Hopeless 0 0 0 1 1  PHQ - 2 Score 0 0 0 1 1  Altered sleeping 0 0 - 0 -  Tired, decreased energy 0 0 - 0 -  Change in appetite 0 0 - 0 -  Feeling bad or failure about yourself  0 0 - 1 -  Trouble concentrating 1 0 - 0 -  Moving slowly or fidgety/restless 0 0 - 0 -  Suicidal thoughts 0 0 - 0 -  PHQ-9 Score 1 0 - 2 -  Difficult doing work/chores Not difficult at all - - - -   Urine dip: SG 1.020, 1+ leuks, otherwise negative  ASSESSMENT/PLAN:  Annual physical exam - Plan: POCT Urinalysis DIP (Proadvantage Device), Urine Culture, Comprehensive metabolic panel, CBC with Differential/Platelet  Pure hypercholesterolemia - reviewed ASCVD risks; would like CT calcium scan to see if statin truly needed. cont low cholesterol diet - Plan: CT CARDIAC SCORING (SELF PAY ONLY)  Depression, major, in remission ([place]) - doing well on Lexapro.  Recent worsening of moods, short-lived. 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  Some leuks on dip, will send for culture - Plan: Urine Culture  Medication monitoring encounter - Plan: Comprehensive metabolic panel, CBC with Differential/Platelet  Gastroesophageal reflux disease without esophagitis - controlled with Dexilant, skipping on weekends. Discussed PPI risks vs those of untreated GERD. Rec lowest effective dose, trial qod vs different PPI - Plan: dexlansoprazole (DEXILANT) 60 MG capsule  Insomnia, unspecified type - controlled with periactin - Plan: cyproheptadine (PERIACTIN) 4 MG tablet  Anxiety state - well controlled with lexapro - Plan: escitalopram (LEXAPRO) 10 MG tablet  Screen for STD (sexually transmitted disease) -  she will get rest of STD screen through GYN at upcoming appt - Plan: RPR, HIV Antibody (routine testing w rflx)  Strain of left rotator cuff capsule, initial encounter - shown ROM exercises, discussed trial of NSAIDs (OTC) x 7-10d, and take PPI along with it daily.  Consider PT if not improving   Discussed monthly self breast exams and yearly mammograms; at least 30 minutes of aerobic activity at least 5 days/week, weight-bearing exercise at least 2x/week; proper sunscreen use reviewed; healthy diet, including goals of calcium and vitamin D intake and alcohol recommendations (less than or equal to 1 drink/day) reviewed; regular seatbelt use; changing batteries in smoke detectors. Immunization recommendations discussed--she declines flu shots, encouraged to get yearly. Bivalent COVID booster given today. TdaP is due--pt prefers to get a different day from the [place] booster, will return for [place]. Colonoscopy recommendations reviewed--UTD, due again in 06/2023.   F/u 1 year, sooner prn (based on CT coronary scan results)

## 2021-11-11 ENCOUNTER — Ambulatory Visit (INDEPENDENT_AMBULATORY_CARE_PROVIDER_SITE_OTHER): Payer: BC Managed Care – PPO | Admitting: Family Medicine

## 2021-11-11 ENCOUNTER — Other Ambulatory Visit: Payer: Self-pay

## 2021-11-11 ENCOUNTER — Encounter: Payer: Self-pay | Admitting: Family Medicine

## 2021-11-11 VITALS — BP 130/80 | HR 72 | Ht 65.0 in | Wt 170.0 lb

## 2021-11-11 DIAGNOSIS — R8290 Unspecified abnormal findings in urine: Secondary | ICD-10-CM | POA: Diagnosis not present

## 2021-11-11 DIAGNOSIS — E7800 Pure hypercholesterolemia, unspecified: Secondary | ICD-10-CM

## 2021-11-11 DIAGNOSIS — Z23 Encounter for immunization: Secondary | ICD-10-CM | POA: Diagnosis not present

## 2021-11-11 DIAGNOSIS — S46012A Strain of muscle(s) and tendon(s) of the rotator cuff of left shoulder, initial encounter: Secondary | ICD-10-CM

## 2021-11-11 DIAGNOSIS — Z5181 Encounter for therapeutic drug level monitoring: Secondary | ICD-10-CM | POA: Diagnosis not present

## 2021-11-11 DIAGNOSIS — Z113 Encounter for screening for infections with a predominantly sexual mode of transmission: Secondary | ICD-10-CM | POA: Diagnosis not present

## 2021-11-11 DIAGNOSIS — R350 Frequency of micturition: Secondary | ICD-10-CM

## 2021-11-11 DIAGNOSIS — F325 Major depressive disorder, single episode, in full remission: Secondary | ICD-10-CM | POA: Diagnosis not present

## 2021-11-11 DIAGNOSIS — Z0000 Encounter for general adult medical examination without abnormal findings: Secondary | ICD-10-CM

## 2021-11-11 DIAGNOSIS — G4700 Insomnia, unspecified: Secondary | ICD-10-CM

## 2021-11-11 DIAGNOSIS — K219 Gastro-esophageal reflux disease without esophagitis: Secondary | ICD-10-CM

## 2021-11-11 DIAGNOSIS — F411 Generalized anxiety disorder: Secondary | ICD-10-CM

## 2021-11-11 LAB — POCT URINALYSIS DIP (PROADVANTAGE DEVICE)
Bilirubin, UA: NEGATIVE
Blood, UA: NEGATIVE
Glucose, UA: NEGATIVE mg/dL
Ketones, POC UA: NEGATIVE mg/dL
Nitrite, UA: NEGATIVE
Protein Ur, POC: NEGATIVE mg/dL
Specific Gravity, Urine: 1.02
Urobilinogen, Ur: NEGATIVE
pH, UA: 6 (ref 5.0–8.0)

## 2021-11-11 MED ORDER — CYPROHEPTADINE HCL 4 MG PO TABS: 4.0000 mg | ORAL_TABLET | Freq: Every evening | ORAL | 3 refills | Status: DC | PRN

## 2021-11-11 MED ORDER — ESCITALOPRAM OXALATE 10 MG PO TABS: 10.0000 mg | ORAL_TABLET | Freq: Every day | ORAL | 3 refills | Status: DC

## 2021-11-11 MED ORDER — DEXLANSOPRAZOLE 60 MG PO CPDR: 1.0000 | DELAYED_RELEASE_CAPSULE | Freq: Every day | ORAL | 11 refills | Status: DC

## 2021-11-12 LAB — COMPREHENSIVE METABOLIC PANEL
ALT: 16 IU/L (ref 0–32)
AST: 23 IU/L (ref 0–40)
Albumin/Globulin Ratio: 1.6 (ref 1.2–2.2)
Albumin: 4.6 g/dL (ref 3.8–4.8)
Alkaline Phosphatase: 65 IU/L (ref 44–121)
BUN/Creatinine Ratio: 14 (ref 12–28)
BUN: 14 mg/dL (ref 8–27)
Bilirubin Total: 0.4 mg/dL (ref 0.0–1.2)
CO2: 24 mmol/L (ref 20–29)
Calcium: 9.6 mg/dL (ref 8.7–10.3)
Chloride: 104 mmol/L (ref 96–106)
Creatinine, Ser: 1.01 mg/dL — ABNORMAL HIGH (ref 0.57–1.00)
Globulin, Total: 2.8 g/dL (ref 1.5–4.5)
Glucose: 84 mg/dL (ref 70–99)
Potassium: 4.5 mmol/L (ref 3.5–5.2)
Sodium: 143 mmol/L (ref 134–144)
Total Protein: 7.4 g/dL (ref 6.0–8.5)
eGFR: 62 mL/min/{1.73_m2} (ref >59)

## 2021-11-12 LAB — CBC WITH DIFFERENTIAL/PLATELET
Basophils Absolute: 0 10*3/uL (ref 0.0–0.2)
Basos: 1 %
EOS (ABSOLUTE): 0.1 10*3/uL (ref 0.0–0.4)
Eos: 1 %
Hematocrit: 44.3 % (ref 34.0–46.6)
Hemoglobin: 14.6 g/dL (ref 11.1–15.9)
Immature Grans (Abs): 0 10*3/uL (ref 0.0–0.1)
Immature Granulocytes: 0 %
Lymphocytes Absolute: 1.2 10*3/uL (ref 0.7–3.1)
Lymphs: 20 %
MCH: 29.4 pg (ref 26.6–33.0)
MCHC: 33 g/dL (ref 31.5–35.7)
MCV: 89 fL (ref 79–97)
Monocytes Absolute: 0.4 10*3/uL (ref 0.1–0.9)
Monocytes: 7 %
Neutrophils Absolute: 4.3 10*3/uL (ref 1.4–7.0)
Neutrophils: 71 %
Platelets: 237 10*3/uL (ref 150–450)
RBC: 4.97 x10E6/uL (ref 3.77–5.28)
RDW: 14.1 % (ref 11.7–15.4)
WBC: 6 10*3/uL (ref 3.4–10.8)

## 2021-11-12 LAB — HIV ANTIBODY (ROUTINE TESTING W REFLEX): HIV Screen 4th Generation wRfx: NONREACTIVE

## 2021-11-12 LAB — RPR: RPR Ser Ql: NONREACTIVE

## 2021-11-13 ENCOUNTER — Encounter: Payer: Self-pay | Admitting: Family Medicine

## 2021-11-13 ENCOUNTER — Encounter: Payer: Self-pay | Admitting: *Deleted

## 2021-11-15 LAB — URINE CULTURE

## 2021-11-25 ENCOUNTER — Encounter: Payer: Self-pay | Admitting: Family Medicine

## 2021-11-25 DIAGNOSIS — S46012A Strain of muscle(s) and tendon(s) of the rotator cuff of left shoulder, initial encounter: Secondary | ICD-10-CM

## 2021-11-26 DIAGNOSIS — Z01411 Encounter for gynecological examination (general) (routine) with abnormal findings: Secondary | ICD-10-CM | POA: Diagnosis not present

## 2021-11-26 DIAGNOSIS — Z1151 Encounter for screening for human papillomavirus (HPV): Secondary | ICD-10-CM | POA: Diagnosis not present

## 2021-11-26 DIAGNOSIS — Z6827 Body mass index (BMI) 27.0-27.9, adult: Secondary | ICD-10-CM | POA: Diagnosis not present

## 2021-11-26 DIAGNOSIS — Z124 Encounter for screening for malignant neoplasm of cervix: Secondary | ICD-10-CM | POA: Diagnosis not present

## 2021-11-26 DIAGNOSIS — Z130 Encounter for screening for diseases of the blood and blood-forming organs and certain disorders involving the immune mechanism: Secondary | ICD-10-CM | POA: Diagnosis not present

## 2021-12-04 ENCOUNTER — Other Ambulatory Visit: Payer: Self-pay | Admitting: Family Medicine

## 2021-12-04 DIAGNOSIS — K219 Gastro-esophageal reflux disease without esophagitis: Secondary | ICD-10-CM

## 2021-12-09 NOTE — Telephone Encounter (Signed)
Please refer to [facility] PT. Thanks

## 2021-12-13 ENCOUNTER — Other Ambulatory Visit: Payer: Self-pay

## 2021-12-13 ENCOUNTER — Ambulatory Visit (HOSPITAL_COMMUNITY)
Admission: RE | Admit: 2021-12-13 | Discharge: 2021-12-13 | Disposition: A | Discharge status: Home / Self Care | Payer: Self-pay | Source: Ambulatory Visit | Attending: Family Medicine | Admitting: Family Medicine

## 2021-12-13 DIAGNOSIS — E7800 Pure hypercholesterolemia, unspecified: Secondary | ICD-10-CM | POA: Insufficient documentation

## 2022-02-10 ENCOUNTER — Encounter: Payer: Self-pay | Admitting: Family Medicine

## 2022-02-11 ENCOUNTER — Telehealth: Payer: Self-pay

## 2022-02-11 NOTE — Telephone Encounter (Signed)
P.A. BRAND DEXILANT

## 2022-02-14 NOTE — Telephone Encounter (Signed)
P.A. approved, called pharmacy and Brand and generic both went thru for same price $100, pt was paying $80 last year.  Called pt and informed

## 2022-03-05 ENCOUNTER — Telehealth: Payer: Self-pay | Admitting: Family Medicine

## 2022-03-05 NOTE — Telephone Encounter (Signed)
Pt wanted to see if you had and Restora samples for her ?

## 2022-03-05 NOTE — Telephone Encounter (Signed)
Called patient and let he know we do not have any samples at the moment.  ?

## 2022-03-19 ENCOUNTER — Encounter: Payer: Self-pay | Admitting: Family Medicine

## 2022-03-19 ENCOUNTER — Ambulatory Visit: Payer: BC Managed Care – PPO | Admitting: Family Medicine

## 2022-03-19 ENCOUNTER — Other Ambulatory Visit: Payer: Self-pay

## 2022-03-19 VITALS — BP 100/60 | HR 68 | Temp 97.4°F | Ht 66.0 in | Wt 172.2 lb

## 2022-03-19 DIAGNOSIS — K5900 Constipation, unspecified: Secondary | ICD-10-CM | POA: Diagnosis not present

## 2022-03-19 DIAGNOSIS — R319 Hematuria, unspecified: Secondary | ICD-10-CM

## 2022-03-19 DIAGNOSIS — N898 Other specified noninflammatory disorders of vagina: Secondary | ICD-10-CM

## 2022-03-19 DIAGNOSIS — K602 Anal fissure, unspecified: Secondary | ICD-10-CM

## 2022-03-19 LAB — POCT URINALYSIS DIP (PROADVANTAGE DEVICE)
Bilirubin, UA: NEGATIVE
Blood, UA: NEGATIVE
Glucose, UA: NEGATIVE mg/dL
Ketones, POC UA: NEGATIVE mg/dL
Leukocytes, UA: NEGATIVE
Nitrite, UA: NEGATIVE
Protein Ur, POC: NEGATIVE mg/dL
Specific Gravity, Urine: 1.01
Urobilinogen, Ur: NEGATIVE
pH, UA: 6 (ref 5.0–8.0)

## 2022-03-19 NOTE — Progress Notes (Signed)
Chief Complaint  ?Patient presents with  ? Hematuria  ?  Patient saw blood in her urine on Monday at lunchtime. When she wiped there was also blood. She realized that she has also been having some vaginal itching x 3 weeks, has not had any discharge but someodor. Over the last 3 weeks she has been having a sharp pain when she has a bowel movement as well.   ? ?2 days ago noticed blood in the toilet (a "dollop", not mixed in with the urine), and on the toilet paper when she wiped. She has been having external vaginal itching, and admits to a lot of scratching (with the toilet paper).  She hasn't noticed any further bleeding, just that one time on Monday. ? ?Has been out of the Restora for about 3 weeks, the same time that the itching has started. ?No changes to fabric softener or detergent changes.  Toilet paper at work is very rough, but hasn't changed. She likes that it is rough (now, not previously) because it feels good to rub/scratch with it.  ?Denies any vaginal discharge or rash or lesion ? ?If she has a hard stool, she notices a sharp pain on the left side as she passes the hard stool. ?She has hard stools at least once a week, which seems to be related to cheese intake. ?Reports eating a high fiber diet, stays hydrated, and uses stool softeners every night. ? ?PMH, PSH, SH reviewed ? ?Outpatient Encounter Medications as of 03/19/2022  ?Medication Sig Note  ? Apoaequorin (PREVAGEN PO) Take 1 capsule by mouth daily.   ? Biotin 5000 MCG CAPS Take 1 capsule by mouth daily.   ? BLACK CURRANT SEED OIL PO Take by mouth.   ? Calcium Carbonate-Vit D-Min (CALCIUM 1200 PO) Take 1 each by mouth daily.   ? cyproheptadine (PERIACTIN) 4 MG tablet Take 1 tablet (4 mg total) by mouth at bedtime as needed. for sleep   ? dexlansoprazole (DEXILANT) 60 MG capsule Take 1 capsule (60 mg total) by mouth daily.   ? escitalopram (LEXAPRO) 10 MG tablet Take 1 tablet (10 mg total) by mouth daily.   ? folic acid (FOLVITE) 720 MCG tablet  Take 400 mcg by mouth daily.   ? glucosamine-chondroitin 500-400 MG tablet Take 1 tablet by mouth 3 (three) times daily. 06/16/2016: Takes once daily  ? Magnesium 500 MG TABS Take 1 tablet by mouth daily.   ? Turmeric 500 MG TABS Take 500 mg by mouth.   ? vitamin D, CHOLECALCIFEROL, 400 UNITS tablet Take 400 Units by mouth daily.   ? vitamin E 100 UNIT capsule Take 100 Units by mouth daily.   ? zinc gluconate 50 MG tablet Take 50 mg by mouth daily.   ? loratadine (CLARITIN) 10 MG tablet Take 10 mg by mouth daily. (Patient not taking: Reported on 03/19/2022) 11/11/2021: As needed  ? Probiotic Product (RESTORA) CAPS Take 1 capsule by mouth daily. (Patient not taking: Reported on 03/19/2022)   ? ?Facility-Administered Encounter Medications as of 03/19/2022  ?Medication  ? 0.9 %  sodium chloride infusion  ? ?Allergies  ?Allergen Reactions  ? Amoxicillin-Pot Clavulanate Diarrhea and Nausea Only  ? ?ROS: no fever, chills, URI symptoms, abdominal pain, dysuria, vaginal discharge. +blood in urine per HPI, resolved.  +vaginal itching.  +intermittent hard stools. No melena, hematochezia or abdominal pain,  No other bleeding, denies bruising, rash. ? ? ?PHYSICAL EXAM: ? ?BP 100/60   Pulse 68   Temp (!) 97.4 ?F (  36.3 ?C) (Tympanic)   Ht '5\' 6"'$  (1.676 m)   Wt 172 lb 3.2 oz (78.1 kg)   BMI 27.79 kg/m?  ? ?Wt Readings from Last 3 Encounters:  ?03/19/22 172 lb 3.2 oz (78.1 kg)  ?11/11/21 170 lb (77.1 kg)  ?06/05/21 170 lb (77.1 kg)  ? ? ?Pleasant, well-appearing female in no distress ?HEENT: conjunctiva and sclera are clear, EOMI, wearing mask ?Neck: no lymphadenopathy ?Heart: regular rate and rhythm ?Lungs: clear ?Back: no spinal or CVA tenderness ?Abdomen: soft, no organomegaly or mass.  Mild suprapubic TTP (chronic and unchanged per pt) ?External genitalia: Hypopigmentation noted bilaterally (small amount anteriorly, but fairly widespread in the area, appearing similar to vitiligo, but no significant hypopigmentation elsewhere  on body), along the labia. ?There is some erythema at the vaginal introitus. ?She has a focal area of itching along the L labia minora--other than being within the hypopigmented area, appears the same as the surrounding tissue--no sores, lesions, or roughness. ?There is a superficial vein on both sides, but the one on the L is also within an itchy area. ?Rectal exam: at the 3 o'clock position at the anus it appears to be slightly inflamed, and is mildly tender in this area. ? ?Urine: SG 1.010, negative for any blood, protein, leuks. ? ? ?ASSESSMENT/PLAN: ? ?Hematuria, unspecified type - reassurred--normal urine today. Ddx reviewed. Suspect related to her scratching, and poss the superficial vein. No e/o recent bleeding on exam - Plan: POCT Urinalysis DIP (Proadvantage Device) ? ?Vaginal itching - Exam not c/w yeast, no h/o any changes other than running out of probiotic.  Restart. Stop scratching, use OTC HC BID-TID prn. f/u with GYN if persists ? ?Anal fissure - Discussed high fiber diet, adequate water intake, stool softeners.  Limit cheese since this causes her constipation ? ?Constipation, unspecified constipation type - cont high fiber diet, adequate water intake, exercise, stool softener and restart probiotic ? ?Vaginal itching. ?Unclear etiology, but no evidence of yeast. ?Trial of hydrocortisone cream 2-3 times daily.  Can also try some vagisil for any irritation (not the kind for yeast). ?Can refer to GYN if ongoing vaginal itching ? ? ? ? ?

## 2022-03-19 NOTE — Patient Instructions (Signed)
Your vaginal area has hypopigmentation (white areas).  I suspect some of this is related to your frequent scratching due to the itching. I see no lesions or sores, or any evidence of a yeast infection. ? ? ?There is a superficial vein (on both sides) that I suspect perhaps you scratched as the cause for the bleeding.  I see no other cuts, or sources of bleeding. ? ?PLEASE stop scratching your vaginal area--it only makes it worse. ?Try hydrocortisone 1% 2-3 times daily to help with the itching.  You can also try Vagisil (for irritation, not yeast) to see if that helps. ? ?Based on the timing of the itching, I wonder if not having the probiotic is contributing.  Restart the [place]. ? ?If your itching doesn't resolve, we can send you to a GYN for further evaluation. ? ?I suspect an intermittent mild anal fissure related to hard stools. ?Continue lots of water, high fiber diet, regular exercise and stool softeners.  Consider cutting back further on cheese vs seeing if different types of cheese are better tolerated (causing less constipation). ? ? ? ?

## 2022-04-11 DIAGNOSIS — H43811 Vitreous degeneration, right eye: Secondary | ICD-10-CM | POA: Diagnosis not present

## 2022-04-18 DIAGNOSIS — H9012 Conductive hearing loss, unilateral, left ear, with unrestricted hearing on the contralateral side: Secondary | ICD-10-CM | POA: Diagnosis not present

## 2022-04-23 ENCOUNTER — Ambulatory Visit: Payer: BC Managed Care – PPO | Admitting: Obstetrics & Gynecology

## 2022-04-23 ENCOUNTER — Encounter: Payer: Self-pay | Admitting: Obstetrics & Gynecology

## 2022-04-23 VITALS — BP 120/68 | Ht 65.75 in | Wt 169.0 lb

## 2022-04-23 DIAGNOSIS — L292 Pruritus vulvae: Secondary | ICD-10-CM | POA: Diagnosis not present

## 2022-04-23 DIAGNOSIS — N898 Other specified noninflammatory disorders of vagina: Secondary | ICD-10-CM

## 2022-04-23 DIAGNOSIS — N950 Postmenopausal bleeding: Secondary | ICD-10-CM

## 2022-04-23 LAB — WET PREP FOR TRICH, YEAST, CLUE

## 2022-04-23 MED ORDER — TINIDAZOLE 500 MG PO TABS: 1000.0000 mg | ORAL_TABLET | Freq: Two times a day (BID) | ORAL | 0 refills | Status: AC

## 2022-04-23 MED ORDER — FLUCONAZOLE 150 MG PO TABS: 150.0000 mg | ORAL_TABLET | Freq: Once | ORAL | 1 refills | Status: AC

## 2022-04-23 NOTE — Progress Notes (Signed)
? ? ?  Laura Kirby 05-19-1956 [id number] ? ? ?     66 y.o.  G0 Single ? ?RP: PMB a month ago with vulvar itching ? ?HPI: Postmenopause, well on no HRT.  Had very mild vaginal bleeding seen when wiping.  That came at a time when patient had vaginal and vulvar itching for which she had scratched intensely. No vaginal bleeding since then.  Seen by Heriberto Antigua MD, exam normal except for Vitiligo of the vulva.   ? ? ?OB History  ?Gravida Para Term Preterm AB Living  ?0 0 0 0 0 0  ?SAB IAB Ectopic Multiple Live Births  ?0 0 0 0    ? ? ?Past medical history,surgical history, problem list, medications, allergies, family history and social history were all reviewed and documented in the EPIC chart. ? ? ?Directed ROS with pertinent positives and negatives documented in the history of present illness/assessment and plan. ? ?Exam: ? ?Vitals:  ? 04/23/22 1055  ?BP: 120/68  ?Weight: 169 lb (76.7 kg)  ?Height: 5' 5.75" (1.67 m)  ? ?General appearance:  Normal ? ?Abdomen: Normal ? ?Gynecologic exam: Vulva:  Vitiligo, especially anteriorly and at the perineum.  Mild inflammation.  Speculum:  Cervix/vagina normal.  Increased discharge.  Wet prep done.  Bimanual exam:  Uterus AV, normal volume, mobile.  No adnexal mass/NT. ? ?Wet prep:  Clue cells present with odor ? ? ?Assessment/Plan:  66 y.o. G0 ? ?1. Postmenopausal bleeding ?Postmenopause, well on no HRT.  Had very mild vaginal bleeding seen when wiping.  That came at a time when patient had vaginal and vulvar itching for which she had scratched intensely. No vaginal bleeding since then.  Gyn exam wnl except for Vulva/Vaginal discharge.  F/U with a Pelvic US to r/o Endometrial pathology. ?- US Transvaginal Non-OB; Future ? ?2. 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  Wet prep confirmed Bacterial Vaginosis.  Counseling on diagnosis and management discussed.  Will treat with Tinidazole.  Usage reviewed and prescription sent to pharmacy.   Fluconazole 150 mg 1 tab PO post ABTx for yeast. ?- WET PREP FOR TRICH, YEAST, CLUE ? ?3. Vaginal discharge ?BV confirmed by wet prep.  Treat with Tinidazole.  Fluconazole post ABTx. ?- WET PREP FOR [place], YEAST, CLUE ? ?Other orders ?- tinidazole (TINDAMAX) 500 MG tablet; Take 2 tablets (1,000 mg total) by mouth 2 (two) times daily for 2 days. ?- fluconazole (DIFLUCAN) 150 MG tablet; Take 1 tablet (150 mg total) by mouth once for 1 dose.  ? ?Princess Bruins MD, 11:12 AM 04/23/2022 ? ? ? ?  ?

## 2022-04-24 ENCOUNTER — Encounter: Payer: Self-pay | Admitting: Obstetrics & Gynecology

## 2022-04-25 DIAGNOSIS — H43811 Vitreous degeneration, right eye: Secondary | ICD-10-CM | POA: Diagnosis not present

## 2022-05-12 ENCOUNTER — Encounter: Payer: Self-pay | Admitting: Family Medicine

## 2022-05-21 DIAGNOSIS — H938X2 Other specified disorders of left ear: Secondary | ICD-10-CM | POA: Diagnosis not present

## 2022-05-21 DIAGNOSIS — H90A21 Sensorineural hearing loss, unilateral, right ear, with restricted hearing on the contralateral side: Secondary | ICD-10-CM | POA: Diagnosis not present

## 2022-05-21 DIAGNOSIS — H90A32 Mixed conductive and sensorineural hearing loss, unilateral, left ear with restricted hearing on the contralateral side: Secondary | ICD-10-CM | POA: Diagnosis not present

## 2022-05-21 DIAGNOSIS — H61893 Other specified disorders of external ear, bilateral: Secondary | ICD-10-CM | POA: Diagnosis not present

## 2022-05-22 ENCOUNTER — Encounter: Payer: Self-pay | Admitting: Obstetrics & Gynecology

## 2022-05-22 ENCOUNTER — Ambulatory Visit: Payer: BC Managed Care – PPO | Admitting: Obstetrics & Gynecology

## 2022-05-22 ENCOUNTER — Ambulatory Visit (INDEPENDENT_AMBULATORY_CARE_PROVIDER_SITE_OTHER): Payer: BC Managed Care – PPO

## 2022-05-22 VITALS — BP 112/68

## 2022-05-22 DIAGNOSIS — N950 Postmenopausal bleeding: Secondary | ICD-10-CM | POA: Diagnosis not present

## 2022-05-22 NOTE — Progress Notes (Signed)
    Laura Kirby 05/03/1956 [id number]        66 y.o.  G0  RP: PMB in 02/2022 for Pelvic US  HPI: Postmenopause, well on no HRT.  Had very mild vaginal bleeding seen when wiping in 02/2022.  That came at a time when patient had vaginal and vulvar itching for which she had scratched intensely. No vaginal bleeding since then.  Seen by Heriberto Antigua MD, exam normal except for Vitiligo of the vulva.   H/O HSC/Excision with Versapoint/D+C in 2014.  Myomectomy/Polypectomy. Endometrial Ablation.   OB History  Gravida Para Term Preterm AB Living  0 0 0 0 0 0  SAB IAB Ectopic Multiple Live Births  0 0 0 0      Past medical history,surgical history, problem list, medications, allergies, family history and social history were all reviewed and documented in the EPIC chart.   Directed ROS with pertinent positives and negatives documented in the history of present illness/assessment and plan.  Exam:  Vitals:   05/22/22 1624  BP: 112/68   General appearance:  Normal  Pelvic US today: T/V images.  Anteverted enlarged uterus with multiple calcified intramural and subserosal fibroids seen.  Myometrial microcalcifications seen throughout the uterus.  The overall uterine size is measured at 8.61 x 7.05 x 5.27 cm.  The endometrial lining is suboptimally visualized due to adjacent shadowing from fibroids that are calcified.  Visualized portion of the endometrium is measured at approximately 4.6 mm.  Trace amount of intracavitary fluid seen.  Both ovaries were not visualized vaginally or abdominally.  No adnexal mass seen.  No free fluid in the pelvis.   Assessment/Plan:  66 y.o. G0  1. Postmenopausal bleeding Postmenopause, well on no HRT.  Had very mild vaginal bleeding seen when wiping in 02/2022.  That came at a time when patient had vaginal and vulvar itching for which she had scratched intensely. No vaginal bleeding since then.  Seen by Heriberto Antigua MD, exam normal except for Vitiligo of the vulva.   H/O  HSC/Excision with Versapoint/D+C in 2014.  Myomectomy/Polypectomy. Endometrial Ablation.  Pelvic US findings reviewed with patient.  Suboptimal visualization of the Endometrium.  Decision to proceed with Sonohysterogram/Possible EBx.  May be technically difficult given the h/o Endometrial Ablation, patient aware.   - [place] Sonohysterogram; Future   Counseling on PMB and Pelvic US findings, previous surgeries and fibroids for 15 minutes.  Princess Bruins MD, 5:46 PM 05/22/2022

## 2022-07-17 ENCOUNTER — Ambulatory Visit: Payer: BC Managed Care – PPO

## 2022-07-17 ENCOUNTER — Ambulatory Visit (INDEPENDENT_AMBULATORY_CARE_PROVIDER_SITE_OTHER): Payer: BC Managed Care – PPO | Admitting: Obstetrics & Gynecology

## 2022-07-17 ENCOUNTER — Encounter: Payer: Self-pay | Admitting: Obstetrics & Gynecology

## 2022-07-17 VITALS — BP 108/70 | HR 60

## 2022-07-17 DIAGNOSIS — N950 Postmenopausal bleeding: Secondary | ICD-10-CM | POA: Diagnosis not present

## 2022-07-17 DIAGNOSIS — L292 Pruritus vulvae: Secondary | ICD-10-CM | POA: Diagnosis not present

## 2022-07-17 MED ORDER — FLUCONAZOLE 150 MG PO TABS: 150.0000 mg | ORAL_TABLET | ORAL | 0 refills | Status: AC

## 2022-07-17 NOTE — Progress Notes (Signed)
    Laura Kirby 1956-02-19 [id number]        65 y.o.  G0P0000   RP: PMB with sub-optimal visualization of the endometrial line for Sonohysto  HPI: No recurrence of postmenopausal bleeding since visit on May 22, 2022.  Patient complains of vulvovaginal itching.  Pelvic US 05/22/22 with sub-optimal visualization of the endometrial line. Postmenopause, well on no HRT.  Had very mild vaginal bleeding seen when wiping in 02/2022.  That came at a time when patient had vaginal and vulvar itching for which she had scratched intensely. No vaginal bleeding since then.  Seen by Heriberto Antigua MD, exam normal except for Vitiligo of the vulva.   H/O HSC/Excision with Versapoint/D+C in 2014.  Myomectomy/Polypectomy. Endometrial Ablation.   OB History  Gravida Para Term Preterm AB Living  0 0 0 0 0 0  SAB IAB Ectopic Multiple Live Births  0 0 0 0      Past medical history,surgical history, problem list, medications, allergies, family history and social history were all reviewed and documented in the EPIC chart.   Directed ROS with pertinent positives and negatives documented in the history of present illness/assessment and plan.  Exam:  There were no vitals filed for this visit. General appearance:  Normal                                                                    Sono Infusion Hysterogram ( procedure note)   The initial transvaginal ultrasound demonstrated the following:  Endometrial thickness measured at 3.28 mm.  Suboptimal visualization because of many calcifications in the myometrium.   The speculum  was inserted and the cervix cleansed with Betadine solution after confirming that patient has no allergies.A small sonohysterography catheterwas utilized.  Insertion was facilitated with ring forceps, using a spear-like motion the catheter was inserted to the fundus of the uterus. The speculum is then removed carefully to avoid dislodging the catheter. The catheter was flushed with  sterile saline delete prior to insertion to rid it of small amounts of air.the sterile saline solution was infused into the uterine cavity as a vaginal ultrasound probe was then placed in the vagina for full visualization of the uterine cavity from a transvaginal approach. The following was noted:  Injection of saline in the intra uterine cavity.  No intracavitary mass or defect identified.  Thin and symmetrical endometrium measuring 3.28 mm.  The catheter was then removed after retrieving some of the saline from the intrauterine cavity. An endometrial biopsy was not done. Patient tolerated procedure well. She had received a tablet of Aleve for discomfort.     Assessment/Plan:  66 y.o. G0  1. Postmenopausal bleeding Normal intrauterine cavity with thin walls on Sonohysto.  Patient reassured.  Postmenopausal bleeding precautions reviewed.  2. Vulvar itching Clinical yeast vulvovaginitis.  Will treat with Fluconazole.  Other orders - fluconazole (DIFLUCAN) 150 MG tablet; Take 1 tablet (150 mg total) by mouth every other day for 3 doses.   Princess Bruins MD, 4:39 PM 07/17/2022

## 2022-07-21 ENCOUNTER — Encounter: Payer: Self-pay | Admitting: Obstetrics & Gynecology

## 2022-08-20 ENCOUNTER — Encounter: Payer: Self-pay | Admitting: Family Medicine

## 2022-08-20 ENCOUNTER — Ambulatory Visit: Payer: BC Managed Care – PPO | Admitting: Family Medicine

## 2022-08-20 VITALS — BP 120/70 | HR 66 | Wt 169.0 lb

## 2022-08-20 DIAGNOSIS — R0789 Other chest pain: Secondary | ICD-10-CM | POA: Diagnosis not present

## 2022-08-20 NOTE — Progress Notes (Signed)
Chief Complaint  Patient presents with   Acute Visit    Last Thursday pt had pain on left side under ribs. She said it is better but she can still feel pain when you press it. Pt also said she feels "blah"  and no appetite.    6 days ago she had she pains at her left lateral ribs. She noticed the discomfort as she was leaving work. Pain is sharp and short-lived. The next day she had ongoing pain--noticed pain when laying on the left side, . Hurt to try and stretch the area.  Recalls having pain even just walking in her kitchen. Sharp pain, "like a contraction". Occurred 1-2x/day on Fri and Sat. Sat she started to use heat. This helped some. Tylenol didn't help. She took Aleve, which really helped. She took 3 Aleve Sunday (1 in am, 2 at night), 1 Monday morning, 2 yesterday. She only notes pain if she touches the rib.  Has no appetite, feels fatigued also started on Thursday of last week. Had been traveling, returned from [place] on Monday 8/14 Worked out Tues through Thursday.  Started using bands for upper body strength training since her return from her trip.    PMH, PSH, SH reviewed  Outpatient Encounter Medications as of 08/20/2022  Medication Sig   Apoaequorin (PREVAGEN PO) Take 1 capsule by mouth daily.   Biotin 5000 MCG CAPS Take 1 capsule by mouth daily.   Calcium Carbonate-Vit D-Min (CALCIUM 1200 PO) Take 1 each by mouth daily.   cyproheptadine (PERIACTIN) 4 MG tablet Take 1 tablet (4 mg total) by mouth at bedtime as needed. for sleep   dexlansoprazole (DEXILANT) 60 MG capsule Take 1 capsule (60 mg total) by mouth daily.   escitalopram (LEXAPRO) 10 MG tablet Take 1 tablet (10 mg total) by mouth daily.   folic acid (FOLVITE) 782 MCG tablet Take 400 mcg by mouth daily.   glucosamine-chondroitin 500-400 MG tablet Take 1 tablet by mouth daily.   loratadine (CLARITIN) 10 MG tablet Take 10 mg by mouth as needed.   Magnesium 500 MG TABS Take 1 tablet by mouth daily.   Probiotic  Product (RESTORA) CAPS Take 1 capsule by mouth daily.   Turmeric 500 MG TABS Take 500 mg by mouth.   vitamin D, CHOLECALCIFEROL, 400 UNITS tablet Take 400 Units by mouth daily.   vitamin E 100 UNIT capsule Take 100 Units by mouth daily.   zinc gluconate 50 MG tablet Take 50 mg by mouth daily.   No facility-administered encounter medications on file as of 08/20/2022.   Aleve per HPI  Allergies  Allergen Reactions   Amoxicillin-Pot Clavulanate Diarrhea and Nausea Only    ROS: No f/c/n/v/d, urinary issues. No abdominal pain. No leg swelling, no shortness of breath. No f/c. Slight runny nose earlier, not now. Left chest wall pain per HPI   PHYSICAL EXAM:  BP 120/70   Pulse 66   Wt 169 lb (76.7 kg)   SpO2 96%   BMI 27.49 kg/m   Wt Readings from Last 3 Encounters:  08/20/22 169 lb (76.7 kg)  04/23/22 169 lb (76.7 kg)  03/19/22 172 lb 3.2 oz (78.1 kg)    Well-appearing, pleasant female in no distress HEENT: conjunctiva and sclera are clear, EOMI. Braces on lower teeth Neck: no lymphadenopathy or mass Heart: regular rate and rhythm Lungs: clear bilaterally Back: no spinal or CVA tenderness Veyr small focal area of tenderness along the lateral inferior ribs No bony stepoff, no bruising or  soft tissue swelling Extremities: no edema Psych: normal mood, affect, hygiene and grooming Neuro: alert and oriented, normal gait, grossly normal cranial nerves   ASSESSMENT/PLAN:  Left-sided chest wall pain - musculoskeletal; improving with heat and Aleve, cont.  f/u if persistent/worsens (for x-rays)  Continue to use heat. Continue aleve twice daily with food until your pain has resolved. If you have ongoing pain, let us know and we can order an x-ray to evaluate the chest and ribs.  Stay well hydrated--be sure to drink more than just water (something with electrolytes) if perspiring a lot.

## 2022-08-20 NOTE — Patient Instructions (Signed)
Continue to use heat. Continue aleve twice daily with food until your pain has resolved. If you have ongoing pain, let us know and we can order an x-ray to evaluate the chest and ribs.  Stay well hydrated--be sure to drink more than just water (something with electrolytes) if perspiring a lot.

## 2022-09-03 ENCOUNTER — Encounter: Payer: Self-pay | Admitting: Internal Medicine

## 2022-09-03 DIAGNOSIS — H40053 Ocular hypertension, bilateral: Secondary | ICD-10-CM | POA: Diagnosis not present

## 2022-10-07 ENCOUNTER — Encounter: Payer: Self-pay | Admitting: Internal Medicine

## 2022-10-24 DIAGNOSIS — Z1231 Encounter for screening mammogram for malignant neoplasm of breast: Secondary | ICD-10-CM | POA: Diagnosis not present

## 2022-10-24 LAB — HM MAMMOGRAPHY

## 2022-10-30 ENCOUNTER — Encounter: Payer: Self-pay | Admitting: *Deleted

## 2022-11-03 ENCOUNTER — Ambulatory Visit (INDEPENDENT_AMBULATORY_CARE_PROVIDER_SITE_OTHER): Payer: Self-pay | Admitting: Surgical

## 2022-11-03 DIAGNOSIS — Z719 Counseling, unspecified: Secondary | ICD-10-CM

## 2022-11-03 NOTE — Progress Notes (Signed)
Referring Provider Rita Ohara, MD [address] [place],  [place] [address]   CC: No chief complaint on file.     Laura Kirby is an 66 y.o. female.  HPI: Patient is a 66 year old female here for evaluation of her face and hands.  She is interested in discussing pricing for Juvderm and Restylane fillers, she is also interested in discussing filler to her nasolabial folds and her hands.  She reports that she has had filler before in her nasolabial folds, but is interested in more.  She reports she typically receives her filler at [facility].  She reports that she is bothered by the wrinkling/elasticity of her hands, reports that she is interested in filler in these areas.  Allergies  Allergen Reactions   Amoxicillin-Pot Clavulanate Diarrhea and Nausea Only    Outpatient Encounter Medications as of 11/03/2022  Medication Sig   Apoaequorin (PREVAGEN PO) Take 1 capsule by mouth daily.   Biotin 5000 MCG CAPS Take 1 capsule by mouth daily.   Calcium Carbonate-Vit D-Min (CALCIUM 1200 PO) Take 1 each by mouth daily.   cyproheptadine (PERIACTIN) 4 MG tablet Take 1 tablet (4 mg total) by mouth at bedtime as needed. for sleep   dexlansoprazole (DEXILANT) 60 MG capsule Take 1 capsule (60 mg total) by mouth daily.   escitalopram (LEXAPRO) 10 MG tablet Take 1 tablet (10 mg total) by mouth daily.   folic acid (FOLVITE) 854 MCG tablet Take 400 mcg by mouth daily.   glucosamine-chondroitin 500-400 MG tablet Take 1 tablet by mouth daily.   loratadine (CLARITIN) 10 MG tablet Take 10 mg by mouth as needed.   Magnesium 500 MG TABS Take 1 tablet by mouth daily.   Probiotic Product (RESTORA) CAPS Take 1 capsule by mouth daily.   Turmeric 500 MG TABS Take 500 mg by mouth.   vitamin D, CHOLECALCIFEROL, 400 UNITS tablet Take 400 Units by mouth daily.   vitamin E 100 UNIT capsule Take 100 Units by mouth daily.   zinc gluconate 50 MG tablet Take 50 mg by mouth daily.   No  facility-administered encounter medications on file as of 11/03/2022.     Past Medical History:  Diagnosis Date   Anemia    history    Anxiety state, unspecified    on daily xanax for a couple of years in past   Arthritis    L AC joint/shoulder   Cataract    Depression    Fibroid uterus    GYN--Dr. 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   WISDOM TOOTH EXTRACTION  07/2015    Family History  Problem Relation Age of Onset   Hyperlipidemia Mother    Hypertension Mother  Diabetes Mother    Dementia Mother    Irritable bowel syndrome Mother    Stroke Mother 27   Alcohol abuse Father    Kidney disease Father    Ulcers Father    Allergies Daughter    Eczema Daughter    Cancer Maternal Aunt        breast cancer (60's)   Cancer Maternal Uncle 70       colon   Colon cancer Maternal Uncle 70   Diabetes Maternal Grandfather    Stroke Maternal Grandfather    Heart disease Maternal Grandmother    Cancer Paternal Aunt        ?unsure of type (stomach vs ovarian)   Colon polyps Neg Hx    Esophageal cancer Neg Hx    Rectal cancer Neg Hx    Stomach cancer Neg Hx     Social History   Social History Narrative   Got Ph.D. In 06/2011.     Separated in  2017, in process of getting divorce   Lives with her Schnoodle Mel Almond) puppy 11/2017.   Daughter lives in [place].    Works for [employer] for [employer], doing Oceanographer).  Still teaching online course.     Review of Systems General: Denies fevers, chills, weight loss  Physical Exam    08/20/2022   12:02 PM 07/17/2022    4:44 PM 05/22/2022    4:24 PM  Vitals with BMI  Weight 202 lbs    Systolic [phone]  Diastolic 70 70 68  Pulse 66 60     General:  No acute distress,  Alert and oriented, Non-Toxic, Normal speech and affect HEENT: Patient with some mild hyperpigmentation of her face, mild nasolabial folds noted. Hands: Elasticity and wrinkling of skin of dorsal hand noted  Assessment/Plan Patient is a very pleasant 66 year old female here to discuss filler for her nasolabial fold and her hands.  She is mostly bothered by the elasticity and wrinkling of her hands.    I discussed with the patient that I do not currently inject filler, but that I would discuss her case with Dr. Marla Roe and plan to call her later this week to let her know about her recommendations.  Patient was understanding of this.  Pictures were taken and placed in her chart with her permission.  Carola Rhine Dystany Duffy 11/03/2022, 11:12 AM

## 2022-11-17 NOTE — Patient Instructions (Incomplete)
  HEALTH MAINTENANCE RECOMMENDATIONS:  It is recommended that you get at least 30 minutes of aerobic exercise at least 5 days/week (for weight loss, you may need as much as 60-90 minutes). This can be any activity that gets your heart rate up. This can be divided in 10-15 minute intervals if needed, but try and build up your endurance at least once a week.  Weight bearing exercise is also recommended twice weekly.  Eat a healthy diet with lots of vegetables, fruits and fiber.  "Colorful" foods have a lot of vitamins (ie green vegetables, tomatoes, red peppers, etc).  Limit sweet tea, regular sodas and alcoholic beverages, all of which has a lot of calories and sugar.  Up to 1 alcoholic drink daily may be beneficial for women (unless trying to lose weight, watch sugars).  Drink a lot of water.  Calcium recommendations are 1200-1500 mg daily (1500 mg for postmenopausal women or women without ovaries), and vitamin D 1000 IU daily.  This should be obtained from diet and/or supplements (vitamins), and calcium should not be taken all at once, but in divided doses.  Monthly self breast exams and yearly mammograms for women over the age of 25 is recommended.  Sunscreen of at least SPF 30 should be used on all sun-exposed parts of the skin when outside between the hours of 10 am and 4 pm (not just when at beach or pool, but even with exercise, golf, tennis, and yard work!)  Use a sunscreen that says "broad spectrum" so it covers both UVA and UVB rays, and make sure to reapply every 1-2 hours.  Remember to change the batteries in your smoke detectors when changing your clock times in the spring and fall. Carbon monoxide detectors are recommended for your home.  Use your seat belt every time you are in a car, and please drive safely and not be distracted with cell phones and texting while driving.  You are due to get a tetanus booster (TdaP). Please schedule a nurse visit for this in January (or sooner if not  getting the RSV vaccine). I encourage you to get the RSV vaccine (let us know if you can't get this from the pharmacy and we can see if we can get it here). This should be done in 2 weeks (due to today's vaccine)--important to get before the season is in full swing. If you decide against getting the RSV vaccine, you can return sooner for your Tetanus shot (2 weeks from today's vaccine).  Prevnar-20 (a pneumonia vaccine) is recommended (due to being 66). You can get this at our office (nurse visit, 2 weeks after your tetanus shot). All vaccines should be separated by 2 weeks.  High dose flu shots are also recommended.  Bone density test is recommended. You can discuss where to do this with your GYN.  Please bring [place] copies of your Living Will and Healthcare Power of Attorney once completed and notarized so that it can be scanned into your medical chart.   Please do hamstring and calf stretches regularly. Try Aleve twice daily with food for up to 10 days to see if this resolves the pain. You may want to take the dexilant daily while on the anti-inflammatory. You can also try heat/massage to see if this helps.

## 2022-11-17 NOTE — Progress Notes (Unsigned)
No chief complaint on file.  Laura Kirby is a 66 y.o. female who presents for a complete physical. She is under the care of Dr. Dellis Filbert for her GYN care. Scheduled for visit next month.  Hyperlipidemia follow-up:  She tries to follow a low-fat, low cholesterol diet. Her cholesterol has been controlled by diet in the past. LDL was up to 172 two years ago, down to 157 last year after cutting back on cheese.  She had calcium score of 0 in 11/2021. No incidental findings on overread.  Diet--she is still vegetarian, still limiting her cheese some.   She eats a lot of oatmeal, egg white omelets on the weekends (or sauteed swiss chard), eats a boiled egg 2x/week. Not eating as much salads in the colder weather.    Component Ref Range & Units 1 yr ago 2 yr ago 3 yr ago 4 yr ago 5 yr ago 6 yr ago 7 yr ago  Cholesterol, Total 100 - 199 mg/dL 228 High  252 High  231 High  220 High      Triglycerides 0 - 149 mg/dL 101 104 111 107 88 R 101 R 90 R  HDL >39 mg/dL 53 62 60 56 59 R 50 R 59 R, CM  VLDL Cholesterol Cal 5 - 40 mg/dL _0 LDL Chol Calc (NIH) 0 - 99 mg/dL 157 High  172 High  151 High       Chol/HDL Ratio 0.0 - 4.4 ratio 4.3 4.1 CM 3.9 CM 3.9 CM 3.6 R 4.2 R 3.6 R    Depression/anxiety: Very well controlled on lexapro, denies side effects. She has not needed to use xanax in years.   Short-term foster parent UPDATE_-last year hadn't had any fosters yet.  GERD: Takes Dexilant daily, with good control of symptoms, doesn't take on weekends (unless having foods which will trigger, ie tomatoes). Denies dysphagia.    Headaches: Continues to only get headaches infrequently, related to stress, and sometimes seasons. Previously relieved by Vanquish as needed, but no longer available. She uses Tylenol prn with good results, only needing a few times/year.  She continues to do well in this regard.   Insomnia: Takes periactin nightly to help with sleep. She has some seasonal allergies,  and uses claritin as needed, not recently.  H/o anal fissures causing rectal bleeding. Drinking more water has helped prevent flares.   Has used diltiazem gel in the past, never filled the last prescription. She uses OTC meds prn, which works well.  Flares every 2 months, when constipated.    Immunization History  Administered Date(s) Administered   PFIZER Comirnaty(Gray Top)Covid-19 Tri-Sucrose Vaccine 07/16/2021   PFIZER(Purple Top)SARS-COV-2 Vaccination 03/24/2020, 04/14/2020, 11/01/2020   Pfizer Covid-19 Vaccine Bivalent Booster 77yr & up 11/11/2021   Td 11/17/2005   Tdap 10/18/2011   Zoster Recombinat (Shingrix) 09/29/2017, 12/02/2017   Refuses flu shots Last Pap smear:  +HPV 11/2020 with Dr. BTerri Piedra  Now seeing Dr. LDellis Filbert seen with PMB earlier this year, visit scheduled next month Last mammogram: 09/2022 Last colonoscopy:  06/2018, had tubular adenoma, 5 year f/u rec Last DEXA: never Dentist: twice a year Ophtho: yearly Exercise:   Exercise bike 2x/week for 30 mins.  Does squats and situps every morning (cardio), 15 minutes daily.  Had been doing UE weights, stopped when she injured L shoulder last year UPDATE Walking the dog (stops a lot, "not really exercise").   Yoga stretches   Vitamin D level was normal  at 48 in 2012. She denies changes in vitamins  PMH, PSH, SH and FH were reviewed and updated.    ROS: The patient denies fever, vision changes, weight changes, sore throat, breast concerns, chest pain, palpitations, dizziness, syncope, dyspnea on exertion, cough, swelling, nausea, vomiting, diarrhea, constipation, abdominal pain, melena, hematochezia, indigestion/heartburn.  Denies numbness, tingling, weakness, tremor, suspicious skin lesions.   L hearing loss--??hearing aid  Moods are good, insomnia controlled with medication. Denies any leg cramps.    PHYSICAL EXAM:  There were no vitals taken for this visit.  Wt Readings from Last 3 Encounters:  08/20/22 169  lb (76.7 kg)  04/23/22 169 lb (76.7 kg)  03/19/22 172 lb 3.2 oz (78.1 kg)    General Appearance:    Alert, cooperative, no distress, appears stated age     Head:    Normocephalic, without obvious abnormality, atraumatic     Eyes:    PERRL, conjunctiva/corneas clear, EOM's intact, fundi benign     Ears:    Normal TM's and external ear canals--canals are somewhat narrow, some non-obstructive cerumen is present on the left, clear on the right  Nose:    Not examined, wearing mask due to COVID-19 pandemic     Throat:    Not examined, wearing mask due to COVID-19 pandemic  Neck:    Supple, no lymphadenopathy; thyroid: no enlargement/ tenderness/nodules; no carotid bruit or JVD     Back:    Spine nontender, no curvature, ROM normal, no CVA tenderness    Lungs:    Clear to auscultation bilaterally without wheezes, rales or ronchi; respirations unlabored     Chest Wall:    No tenderness or deformity     Heart:    Regular rate and rhythm, S1 and S2 normal, no murmur, rub or gallop     Breast Exam:    Deferred to GYN     Abdomen:    Soft, nondistended, nontender, normoactive bowel sounds, no masses, no hepatosplenomegaly.  Genitalia:    Deferred to GYN     Rectal:    Deferred to GYN  Extremities:    No clubbing, cyanosis or edema. 2+ PT and DP pulses. See below for shoulder exam     Pulses:    2+ and symmetric all extremities     Skin:    Skin color, texture, turgor normal, no rashes or lesions.   Lymph nodes:    Cervical, supraclavicular nodes normal     Neurologic:    Normal strength, sensation and gait; reflexes 2+ and symmetric throughout                          Psych:  Normal mood, affect, hygiene and grooming    ASSESSMENT/PLAN:  Verify not on Medicare yet. (If she is, then this is Welcome to Medicare visit) Does she still see Dr. Terri Piedra? Pap since 11/2020?  Saw Lavoie for postmenopausal bleeding--going to continue with her??  She never returned for Tdap. Give today if not yet on  Medicare   COVID booster, flu shot--offer/give/decline  PHQ-9  DEXA--does GYN do/order?  Gets mammo at [facility], not sure how to order DEXA to get there.  Cbc, c-met, lipids  RF periactin, lexapro, dexilant  Discussed monthly self breast exams and yearly mammograms; at least 30 minutes of aerobic activity at least 5 days/week, weight-bearing exercise at least 2x/week; proper sunscreen use reviewed; healthy diet, including goals of calcium and vitamin D intake and alcohol  recommendations (less than or equal to 1 drink/day) reviewed; regular seatbelt use; changing batteries in smoke detectors. Immunization recommendations discussed--she declines flu shots, encouraged to get yearly.  Bivalent COVID booster TdaP is past due  Colonoscopy recommendations reviewed--UTD, due again in 06/2023.   F/u 1 year, sooner prn

## 2022-11-19 ENCOUNTER — Ambulatory Visit (INDEPENDENT_AMBULATORY_CARE_PROVIDER_SITE_OTHER): Payer: BC Managed Care – PPO | Admitting: Family Medicine

## 2022-11-19 ENCOUNTER — Encounter: Payer: Self-pay | Admitting: Family Medicine

## 2022-11-19 VITALS — BP 110/64 | HR 64 | Ht 66.0 in | Wt 164.6 lb

## 2022-11-19 DIAGNOSIS — Z23 Encounter for immunization: Secondary | ICD-10-CM

## 2022-11-19 DIAGNOSIS — G4700 Insomnia, unspecified: Secondary | ICD-10-CM

## 2022-11-19 DIAGNOSIS — F325 Major depressive disorder, single episode, in full remission: Secondary | ICD-10-CM | POA: Diagnosis not present

## 2022-11-19 DIAGNOSIS — Z0000 Encounter for general adult medical examination without abnormal findings: Secondary | ICD-10-CM | POA: Diagnosis not present

## 2022-11-19 DIAGNOSIS — Z113 Encounter for screening for infections with a predominantly sexual mode of transmission: Secondary | ICD-10-CM

## 2022-11-19 DIAGNOSIS — E7800 Pure hypercholesterolemia, unspecified: Secondary | ICD-10-CM

## 2022-11-19 DIAGNOSIS — K219 Gastro-esophageal reflux disease without esophagitis: Secondary | ICD-10-CM | POA: Diagnosis not present

## 2022-11-19 DIAGNOSIS — F411 Generalized anxiety disorder: Secondary | ICD-10-CM

## 2022-11-19 LAB — POCT URINALYSIS DIP (PROADVANTAGE DEVICE)
Bilirubin, UA: NEGATIVE
Blood, UA: NEGATIVE
Glucose, UA: NEGATIVE mg/dL
Ketones, POC UA: NEGATIVE mg/dL
Nitrite, UA: NEGATIVE
Protein Ur, POC: NEGATIVE mg/dL
Specific Gravity, Urine: 1.01
Urobilinogen, Ur: 0.2
pH, UA: 7 (ref 5.0–8.0)

## 2022-11-19 MED ORDER — CYPROHEPTADINE HCL 4 MG PO TABS: 4.0000 mg | ORAL_TABLET | Freq: Every evening | ORAL | 3 refills | Status: DC | PRN

## 2022-11-19 MED ORDER — ESCITALOPRAM OXALATE 10 MG PO TABS
10.0000 mg | ORAL_TABLET | Freq: Every day | ORAL | 3 refills | Status: DC
Start: 2022-11-19 — End: 2023-12-14

## 2022-11-19 MED ORDER — DEXLANSOPRAZOLE 60 MG PO CPDR: 1.0000 | DELAYED_RELEASE_CAPSULE | Freq: Every day | ORAL | 11 refills | Status: DC

## 2022-11-20 LAB — COMPREHENSIVE METABOLIC PANEL
ALT: 15 IU/L (ref 0–32)
AST: 20 IU/L (ref 0–40)
Albumin/Globulin Ratio: 1.6 (ref 1.2–2.2)
Albumin: 4.6 g/dL (ref 3.9–4.9)
Alkaline Phosphatase: 65 IU/L (ref 44–121)
BUN/Creatinine Ratio: 16 (ref 12–28)
BUN: 18 mg/dL (ref 8–27)
Bilirubin Total: 0.6 mg/dL (ref 0.0–1.2)
CO2: 24 mmol/L (ref 20–29)
Calcium: 9.7 mg/dL (ref 8.7–10.3)
Chloride: 103 mmol/L (ref 96–106)
Creatinine, Ser: 1.1 mg/dL — ABNORMAL HIGH (ref 0.57–1.00)
Globulin, Total: 2.8 g/dL (ref 1.5–4.5)
Glucose: 85 mg/dL (ref 70–99)
Potassium: 4.3 mmol/L (ref 3.5–5.2)
Sodium: 142 mmol/L (ref 134–144)
Total Protein: 7.4 g/dL (ref 6.0–8.5)
eGFR: 56 mL/min/{1.73_m2} — ABNORMAL LOW (ref >59)

## 2022-11-20 LAB — HIV ANTIBODY (ROUTINE TESTING W REFLEX): HIV Screen 4th Generation wRfx: NONREACTIVE

## 2022-11-20 LAB — CBC WITH DIFFERENTIAL/PLATELET
Basophils Absolute: 0 10*3/uL (ref 0.0–0.2)
Basos: 1 %
EOS (ABSOLUTE): 0.1 10*3/uL (ref 0.0–0.4)
Eos: 2 %
Hematocrit: 45.1 % (ref 34.0–46.6)
Hemoglobin: 14.8 g/dL (ref 11.1–15.9)
Immature Grans (Abs): 0 10*3/uL (ref 0.0–0.1)
Immature Granulocytes: 0 %
Lymphocytes Absolute: 1.3 10*3/uL (ref 0.7–3.1)
Lymphs: 24 %
MCH: 29.5 pg (ref 26.6–33.0)
MCHC: 32.8 g/dL (ref 31.5–35.7)
MCV: 90 fL (ref 79–97)
Monocytes Absolute: 0.4 10*3/uL (ref 0.1–0.9)
Monocytes: 6 %
Neutrophils Absolute: 3.7 10*3/uL (ref 1.4–7.0)
Neutrophils: 67 %
Platelets: 239 10*3/uL (ref 150–450)
RBC: 5.02 x10E6/uL (ref 3.77–5.28)
RDW: 14.1 % (ref 11.7–15.4)
WBC: 5.5 10*3/uL (ref 3.4–10.8)

## 2022-11-20 LAB — RPR: RPR Ser Ql: NONREACTIVE

## 2022-11-20 LAB — LIPID PANEL
Chol/HDL Ratio: 3.6 ratio (ref 0.0–4.4)
Cholesterol, Total: 217 mg/dL — ABNORMAL HIGH (ref 100–199)
HDL: 61 mg/dL (ref >39)
LDL Chol Calc (NIH): 139 mg/dL — ABNORMAL HIGH (ref 0–99)
Triglycerides: 96 mg/dL (ref 0–149)
VLDL Cholesterol Cal: 17 mg/dL (ref 5–40)

## 2022-11-24 ENCOUNTER — Encounter: Payer: Self-pay | Admitting: Family Medicine

## 2022-12-02 ENCOUNTER — Encounter: Payer: Self-pay | Admitting: Family Medicine

## 2022-12-02 ENCOUNTER — Other Ambulatory Visit: Payer: Self-pay | Admitting: *Deleted

## 2022-12-02 DIAGNOSIS — M25561 Pain in right knee: Secondary | ICD-10-CM

## 2022-12-03 NOTE — Progress Notes (Unsigned)
   I, Laura Kirby, LAT, ATC acting as a scribe for Laura Leader, MD.  Subjective:    CC: R knee pain  HPI: Pt is a 66 y/o female c/o R knee pain ongoing since early Nov. She does not that she's been more active lately, since she is fostering a 107 y/o child. Pt locates pain to   R Knee swelling: -into R ankle Mechanical symptoms: Radiates: Aggravates: Treatments tried:  Pertinent review of Systems: ***  Relevant historical information: ***   Objective:   There were no vitals filed for this visit. General: Well Developed, well nourished, and in no acute distress.   MSK: ***  Lab and Radiology Results No results found for this or any previous visit (from the past 72 hour(s)). No results found.    Impression and Recommendations:    Assessment and Plan: 65 y.o. female with ***.  PDMP not reviewed this encounter. No orders of the defined types were placed in this encounter.  No orders of the defined types were placed in this encounter.   Discussed warning signs or symptoms. Please see discharge instructions. Patient expresses understanding.   ***

## 2022-12-04 ENCOUNTER — Ambulatory Visit: Payer: Self-pay

## 2022-12-04 ENCOUNTER — Ambulatory Visit: Payer: BC Managed Care – PPO | Admitting: Family Medicine

## 2022-12-04 VITALS — BP 104/68 | HR 88 | Ht 66.0 in | Wt 167.0 lb

## 2022-12-04 DIAGNOSIS — G8929 Other chronic pain: Secondary | ICD-10-CM

## 2022-12-04 DIAGNOSIS — M25561 Pain in right knee: Secondary | ICD-10-CM | POA: Diagnosis not present

## 2022-12-04 DIAGNOSIS — M7121 Synovial cyst of popliteal space [Baker], right knee: Secondary | ICD-10-CM | POA: Diagnosis not present

## 2022-12-04 NOTE — Patient Instructions (Signed)
Thank you for coming in today.   Call or go to the ER if you develop a large red swollen joint with extreme pain or oozing puss.    You have a baker cyst that has drained in the calf.   Use calf compression with a compression stocking and even a calf compression sleeve.   Also consider a knee compression sleeve.   Body Helix makes very good Knee and Calf compression sleeve.

## 2022-12-05 ENCOUNTER — Encounter: Payer: Self-pay | Admitting: Plastic Surgery

## 2022-12-05 ENCOUNTER — Ambulatory Visit (INDEPENDENT_AMBULATORY_CARE_PROVIDER_SITE_OTHER): Payer: Self-pay | Admitting: Plastic Surgery

## 2022-12-05 DIAGNOSIS — Z719 Counseling, unspecified: Secondary | ICD-10-CM

## 2022-12-05 NOTE — Progress Notes (Signed)
Filler Injection Procedure Note  Procedure:  Filler administration  Pre-operative Diagnosis: Rytides   Post-operative Diagnosis: Same  Surgeon: Electronically signed by: Theodoro Kos, DO   Complications:  None  Brief history: The patient desires injection with fillers in her face. I discussed with the patient this proposed procedure of filler injections, which is customized depending on the particular needs of the patient. It is performed on facial volume loss as a temporary correction. The alternatives were discussed with the patient. The risks were addressed including bleeding, scarring, infection, damage to deeper structures, asymmetry, and chronic pain, which may occur infrequently after a procedure. The individual's choice to undergo a surgical procedure is based on the comparison of risks to potential benefits. Other risks include unsatisfactory results, allergic reaction, which should go away with time, bruising and delayed healing. Fillers do not arrest the aging process or produce permanent tightening.  Operative intervention maybe necessary to maintain the results. The patient understands and wishes to proceed. An informed consent was signed and informational brochures given to her prior to the procedure.  Procedure: The area was prepped with chlorhexidine and dried with a clean gauze. Using a clean technique, a 30 gauge needle was then used to inject the filler into the nasal labial folds and perioral wrinkles. This was done with one syringe.   The midface area was injected at the medial sub-region of the mid-face.  No complications were noted. Light pressure was held for 5 minutes. She was instructed explicitly in post-operative care.  We did not do hand injections because volume is not the issue.  What she has is loose and wrinkled skin but the volume is good.  Restylane Defyne LOT: 20601

## 2022-12-26 ENCOUNTER — Ambulatory Visit: Payer: BC Managed Care – PPO | Admitting: Obstetrics & Gynecology

## 2023-01-08 ENCOUNTER — Ambulatory Visit: Payer: BC Managed Care – PPO | Admitting: Family Medicine

## 2023-01-08 ENCOUNTER — Ambulatory Visit (INDEPENDENT_AMBULATORY_CARE_PROVIDER_SITE_OTHER): Payer: BC Managed Care – PPO

## 2023-01-08 VITALS — BP 100/60 | HR 80 | Ht 66.0 in | Wt 164.0 lb

## 2023-01-08 DIAGNOSIS — S39012A Strain of muscle, fascia and tendon of lower back, initial encounter: Secondary | ICD-10-CM | POA: Diagnosis not present

## 2023-01-08 DIAGNOSIS — M545 Low back pain, unspecified: Secondary | ICD-10-CM | POA: Diagnosis not present

## 2023-01-08 MED ORDER — TIZANIDINE HCL 2 MG PO TABS: 2.0000 mg | ORAL_TABLET | Freq: Every evening | ORAL | 1 refills | Status: DC | PRN

## 2023-01-08 NOTE — Patient Instructions (Addendum)
Thank you for coming in today.   Please get an Xray today before you leave   I've referred you to Physical Therapy.  Let us know if you don't hear from them in one week.   Use tizinadine muscle relaxer as needed mostly at bedtime.   Recheck as needed.   Heating pad is helpful.

## 2023-01-08 NOTE — Progress Notes (Signed)
   Laura Kirby, am serving as a Education administrator for Dr. Lynne Leader.   Laura Kirby is a 67 y.o. female who presents to [place] at [facility] today for LBP.  Patient was previously seen by Dr. Georgina Snell on 12/04/2022 for right knee pain.  Today, patient reports low back pain started this morning when doing yoga. Patient locates pain to to mid right sided back pain. Patent does not have pain when sitting. Pain is better than it was this morning   Radiating pain: no  LE numbness/tingling: no LE weakness: no Aggravates: standing up out of a chair. Going up the steps  Treatments tried: Aleve, Heat, Salon pas   Pertinent review of systems: No fevers or chills  Relevant historical information: Depression   Exam:  BP 100/60   Pulse 80   Ht '5\' 6"'$  (1.676 m)   Wt 164 lb (74.4 kg)   SpO2 97%   BMI 26.47 kg/m  General: Well Developed, well nourished, and in no acute distress.   MSK: L-spine: Nontender to palpation spinal midline.  Mildly tender palpation right lumbar paraspinal musculature. Decreased lumbar motion. Lower extremity strength is intact.    Lab and Radiology Results  X-ray images lumbar spine obtained today personally and independently interpreted Mild spondylosis L4-5.  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  No acute fractures. Await formal radiology review     Assessment and Plan: 67 y.o. female with acute right low back pain.  This is an acute recurrence of her chronic low back pain condition.  She has episodes like this about every month or 2 and they typically last a few days.  Today's pain is thought to be more related to muscle spasm and dysfunction.  She is a good candidate for trial of physical therapy.  Recommend heating pad and I have also prescribed tizanidine.  Refer to PT.  Check back as needed.  X-ray obtained today.   PDMP not reviewed this encounter. Orders Placed This Encounter  Procedures   DG Lumbar Spine 2-3 Views    Standing  Status:   Future    Number of Occurrences:   1    Standing Expiration Date:   01/09/2024    Order Specific Question:   Reason for Exam (SYMPTOM  OR DIAGNOSIS REQUIRED)    Answer:   eval low back    Order Specific Question:   Preferred imaging location?    Answer:   [facility]   Ambulatory referral to Physical Therapy    Referral Priority:   Routine    Referral Type:   Physical Medicine    Referral Reason:   Specialty Services Required    Requested Specialty:   Physical Therapy    Number of Visits Requested:   1   Meds ordered this encounter  Medications   tiZANidine (ZANAFLEX) 2 MG tablet    Sig: Take 1-2 tablets (2-4 mg total) by mouth at bedtime as needed for muscle spasms.    Dispense:  30 tablet    Refill:  1     Discussed warning signs or symptoms. Please see discharge instructions. Patient expresses understanding.   The above documentation has been reviewed and is accurate and complete Lynne Leader, M.D.

## 2023-01-09 ENCOUNTER — Encounter: Payer: Self-pay | Admitting: Family Medicine

## 2023-01-12 MED ORDER — CYCLOBENZAPRINE HCL 5 MG PO TABS: 5.0000 mg | ORAL_TABLET | Freq: Every evening | ORAL | 0 refills | Status: DC | PRN

## 2023-01-12 NOTE — Progress Notes (Signed)
Lumbar spine x-ray shows some evidence of muscle spasm and some arthritis changes in the back.

## 2023-01-16 ENCOUNTER — Ambulatory Visit: Payer: BC Managed Care – PPO | Attending: Family Medicine | Admitting: Physical Therapy

## 2023-01-16 ENCOUNTER — Ambulatory Visit: Payer: BC Managed Care – PPO | Admitting: Family Medicine

## 2023-01-16 DIAGNOSIS — S39012A Strain of muscle, fascia and tendon of lower back, initial encounter: Secondary | ICD-10-CM | POA: Insufficient documentation

## 2023-01-16 DIAGNOSIS — M545 Low back pain, unspecified: Secondary | ICD-10-CM | POA: Insufficient documentation

## 2023-01-16 DIAGNOSIS — M6281 Muscle weakness (generalized): Secondary | ICD-10-CM | POA: Diagnosis not present

## 2023-01-16 DIAGNOSIS — R278 Other lack of coordination: Secondary | ICD-10-CM | POA: Insufficient documentation

## 2023-01-16 NOTE — Therapy (Signed)
OUTPATIENT PHYSICAL THERAPY THORACOLUMBAR EVALUATION   Patient Name: Miyako Oelke MRN: [id number] DOB:1956-06-19, 67 y.o., female Today's Date: 01/16/2023  END OF SESSION:  PT End of Session - 01/16/23 0906     Visit Number 1    Date for PT Re-Evaluation 03/13/23    PT Start Time 0800    PT Stop Time 0835    PT Time Calculation (min) 35 min    Activity Tolerance Patient tolerated treatment well    Behavior During Therapy [facility] for tasks assessed/performed             Past Medical History:  Diagnosis Date   Anemia    history    Anxiety state, unspecified    on daily xanax for a couple of years in past   Arthritis    L AC joint/shoulder   Cataract    Depression    Fibroid uterus    GYN--Dr. Terri Piedra   GERD (gastroesophageal reflux disease)    EGD at [facility]   Hearing loss in left ear 2008   s/p surgery 12/2012, has persistent hearing loss   Hyperlipidemia    diet controlled   Ovarian cyst    Sickle cell trait ([place])    Past Surgical History:  Procedure Laterality Date   COLONOSCOPY     DILITATION & CURRETTAGE/HYSTROSCOPY WITH VERSAPOINT RESECTION N/A 09/02/2013   Procedure: DILATATION & CURETTAGE/HYSTEROSCOPY WITH VERSAPOINT RESECTION;  Surgeon: Princess Bruins, MD;  Location: [facility];  Service: Gynecology;  Laterality: N/A;   ENDOMETRIAL ABLATION     MYOMECTOMY     Dr. 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 Lower, R side. 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 Her back pain is recurring since in her 63's, but has increased in frequency as she has gotten older. She is severely restricted for several days, and it slowly resolves.  PERTINENT HISTORY:  Per Dr Georgina Snell: low back pain started this morning when doing yoga. Patient locates pain to to mid right sided back pain. Patent does not have pain when sitting. Pain is better than it was this morning  Patient was previously seen by Dr. Georgina Snell on 12/04/2022 for right knee pain.  Radiating pain: no  LE numbness/tingling: no LE weakness: no Aggravates: standing up out of a chair. Going up the steps  Treatments tried: Aleve, Heat, Salon pas  PAIN:  Are you having pain? Yes: NPRS scale: 1, (was 10 just a few days ago)/10 Pain location: R low back and hip Pain description: sharp and excruciating for several days Aggravating factors: Comes on unexpectedly, no specific movement. Relieving factors: Heat, Alleve, but she has been cautioned to avoid too much pain reliever.  PRECAUTIONS: None  WEIGHT BEARING RESTRICTIONS: No  FALLS:  Has patient fallen in last 6 months? No  LIVING  ENVIRONMENT: Lives with: lives with their family Lives in: House/apartment Stairs: Yes: Internal: 14 steps; on right going up Difficulty managing when her pain is acute. Has following equipment at home: None  OCCUPATION: Works for a non Dispensing optician, lots of sitting. She tries to move every hour. Foster parent to 87 YO. She was going to the gym 3 times/week and going to yoga, but has not been exercising since September.  PLOF: Independent  PATIENT GOALS: Manage the pain when it flairs up.  NEXT MD VISIT: none scheduled  OBJECTIVE:   DIAGNOSTIC FINDINGS:  X-ray images lumbar spine obtained today personally and independently interpreted Mild spondylosis L4-5.  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  No acute fractures. Await formal radiology review   SCREENING FOR RED FLAGS: Bowel or bladder incontinence: No Spinal tumors: No Cauda equina syndrome: No Compression fracture: No Abdominal aneurysm: No  COGNITION: Overall cognitive status: Within functional limits for tasks assessed     SENSATION: Not tested  MUSCLE LENGTH: Hamstrings: Right 68 deg; Left 74 deg Thomas test: WFL B.  POSTURE: decreased lumbar lordosis  PALPATION: TTP in one specific spot on R lower lumbo/sacral paraspinals  LUMBAR ROM: WNL, mildly pinches with ext, pulls with L lat flex  LOWER EXTREMITY ROM:   WNL, pulls in back with HS stretch   LOWER EXTREMITY MMT:  4-/5 throughout, mildly painful on R with hip flex and ext.   LUMBAR SPECIAL TESTS:  Slump test: Negative  GAIT: Distance walked: In clinic distances Assistive device utilized: None Level of assistance: Complete Independence Comments: No deviations noted.  TODAY'S TREATMENT:  DATE:  01/16/23  Education Post pelvic tilt, isometric abd/add, bridging HS stretch in supine with ankle pumps for neural  glide.  PATIENT EDUCATION:  Education details: POC, HEP Person educated: Patient Education method: Customer service manager Education comprehension: verbalized understanding and returned demonstration  HOME EXERCISE PROGRAM: [id number]  ASSESSMENT:  CLINICAL IMPRESSION: Patient is a 67 y.o. who was seen today for physical therapy evaluation and treatment for lumbosacral strain. Patient is usually active with exercise and yoga, but has not been performing her normal routine since September when she took in a [place] daughter. Her pain was initially very severe, limiting her activity, but has improved over time and is now very localized in her R lower back. She sits a lot for both of the jobs she performs, and finds this increases her stiffness and pain. She is generally flexible due to her yoga practice. Tends to hold her breath with effort. She would definitely benefit from developing improved core stability and strength to protect against re-injury. Will also assess her work station to ensure she is optimally positioned and supported in sitting.  OBJECTIVE IMPAIRMENTS: decreased activity tolerance, decreased endurance, decreased mobility, decreased strength, improper body mechanics, and pain.   ACTIVITY LIMITATIONS: lifting, bending, sitting, standing, and squatting  PARTICIPATION LIMITATIONS: cleaning, shopping, community activity, and occupation  PERSONAL FACTORS: Past/current experiences are also affecting patient's functional outcome.   REHAB POTENTIAL: Good  CLINICAL DECISION MAKING: Stable/uncomplicated  EVALUATION COMPLEXITY: Low   GOALS: Goals reviewed with patient? Yes  SHORT TERM GOALS: Target date: 01/31/23  I with initial HEP Baseline: Goal status: INITIAL  LONG TERM GOALS: Target date: 03/13/23  I with final HEP Baseline:  Goal status: INITIAL  2.  Patient will be able to complete her daily tasks with no c/O back pain Baseline:  Goal status: INITIAL  3.   Patient will verbalize and demonstrate understanding of basic work station set up to provide adequate support and decrease strain in sitting. Baseline:  Goal status: INITIAL  4.  Increase B LE to 4+/5, and trunk strength and stability to hold appropriate posture during functional strengthening activities. Baseline:  Goal status: INITIAL PLAN:  PT FREQUENCY: 1x/week  PT DURATION: 8 weeks  PLANNED INTERVENTIONS: Therapeutic exercises, Therapeutic activity, Neuromuscular re-education, Balance training, Gait training, Patient/Family education, Self Care, Joint mobilization, Dry Needling, Electrical stimulation, Spinal mobilization, Cryotherapy, Moist heat, Ionotophoresis '4mg'$ /ml Dexamethasone, and Manual therapy.  PLAN FOR NEXT SESSION: Update HEP for strength, postural stability, stretch, STM to local area on back, discuss work station set up   Marcelina Morel, DPT 01/16/2023, 9:12 AM

## 2023-01-23 ENCOUNTER — Encounter: Payer: Self-pay | Admitting: Physical Therapy

## 2023-01-23 ENCOUNTER — Ambulatory Visit: Payer: BC Managed Care – PPO | Admitting: Physical Therapy

## 2023-01-23 DIAGNOSIS — M545 Low back pain, unspecified: Secondary | ICD-10-CM

## 2023-01-23 DIAGNOSIS — M6281 Muscle weakness (generalized): Secondary | ICD-10-CM | POA: Diagnosis not present

## 2023-01-23 DIAGNOSIS — R278 Other lack of coordination: Secondary | ICD-10-CM

## 2023-01-23 DIAGNOSIS — S39012A Strain of muscle, fascia and tendon of lower back, initial encounter: Secondary | ICD-10-CM | POA: Diagnosis not present

## 2023-01-23 NOTE — Therapy (Signed)
OUTPATIENT PHYSICAL THERAPY THORACOLUMBAR TREATMENT   Patient Name: Laura Kirby MRN: [id number] DOB:[birth year], 67 y.o., female Today's Date: 01/23/2023  END OF SESSION:  PT End of Session - 01/23/23 0805     Visit Number 2    Date for PT Re-Evaluation 03/13/23    PT Start Time 0805    PT Stop Time 0845    PT Time Calculation (min) 40 min    Activity Tolerance Patient tolerated treatment well    Behavior During Therapy [facility] for tasks assessed/performed              Past Medical History:  Diagnosis Date   Anemia    history    Anxiety state, unspecified    on daily xanax for a couple of years in past   Arthritis    L AC joint/shoulder   Cataract    Depression    Fibroid uterus    GYN--Dr. Terri Piedra   GERD (gastroesophageal reflux disease)    EGD at [facility]   Hearing loss in left ear 2008   s/p surgery 12/2012, has persistent hearing loss   Hyperlipidemia    diet controlled   Ovarian cyst    Sickle cell trait ([place])    Past Surgical History:  Procedure Laterality Date   COLONOSCOPY     DILITATION & CURRETTAGE/HYSTROSCOPY WITH VERSAPOINT RESECTION N/A 09/02/2013   Procedure: DILATATION & CURETTAGE/HYSTEROSCOPY WITH VERSAPOINT RESECTION;  Surgeon: Princess Bruins, MD;  Location: [facility];  Service: Gynecology;  Laterality: N/A;   ENDOMETRIAL ABLATION     MYOMECTOMY     Dr. Dellis Filbert - abdominal myomectomy   POLYPECTOMY     PTOSIS REPAIR Bilateral 10/17/2015   STAPEDES SURGERY Left 01/26/13   at [facility]; otosclerosis; persistent L hearing loss after surgery   WISDOM TOOTH EXTRACTION  07/2015   Patient Active Problem List   Diagnosis Date Noted   Encounter for counseling 12/05/2022   Tubular adenoma of colon 08/25/2018   Internal hemorrhoids 06/16/2016   Anal fissure 06/16/2016   Incomplete RBBB 10/11/2015   Tension headache 03/10/2013   Depression, major, in remission ([place]) 03/04/2013   Pure hypercholesterolemia 03/04/2013   Anxiety state, unspecified  11/17/2011   GERD 04/17/2009   ABDOMINAL PAIN RIGHT UPPER QUADRANT 04/17/2009    PCP: Rita Ohara, MD  REFERRING PROVIDER: Gregor Hams, MD  REFERRING DIAG:  Diagnosis  S39.012A (ICD-10-CM) - Lumbosacral strain, initial encounter    Rationale for Evaluation and Treatment: Rehabilitation  THERAPY DIAG:  Muscle weakness (generalized)  Other lack of coordination  Acute right-sided low back pain without sciatica  ONSET DATE: 01/08/23  SUBJECTIVE:  SUBJECTIVE STATEMENT: Pt states her back is doing good. "It was a little crunchy yesterday." Can feel it after sitting an extended period and get up. Wanted to start up yoga on Saturday.   PERTINENT HISTORY:  Per Dr Georgina Snell: low back pain started this morning when doing yoga. Patient locates pain to to mid right sided back pain. Patent does not have pain when sitting. Pain is better than it was this morning  Patient was previously seen by Dr. Georgina Snell on 12/04/2022 for right knee pain.  Radiating pain: no  LE numbness/tingling: no LE weakness: no Aggravates: standing up out of a chair. Going up the steps  Treatments tried: Aleve, Heat, Salon pas  PAIN:  Are you having pain? Yes: NPRS scale: 0/10 Pain location: R low back and hip Pain description: sharp and excruciating for several days Aggravating factors: Comes on unexpectedly, no specific movement. Relieving factors: Heat, Alleve, but she has been cautioned to avoid too much pain reliever.  PRECAUTIONS: None  WEIGHT BEARING RESTRICTIONS: No  FALLS:  Has patient fallen in last 6 months? No  LIVING ENVIRONMENT: Lives with: lives with their family Lives in: House/apartment Stairs: Yes: Internal: 14 steps; on right going up Difficulty managing when her pain is acute. 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 She was going to the gym 3 times/week and going to yoga, but has not been exercising since September.  PLOF: Independent  PATIENT GOALS: Manage the pain when it flairs up.  NEXT MD VISIT: none scheduled  OBJECTIVE:   DIAGNOSTIC FINDINGS:  X-ray images lumbar spine obtained today personally and independently interpreted Mild spondylosis L4-5.  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  No acute fractures. Await formal radiology review  MUSCLE LENGTH: Hamstrings: Right 68 deg; Left 74 deg Thomas test: WFL B.  POSTURE: decreased lumbar lordosis  PALPATION: TTP in one specific spot on R lower lumbo/sacral paraspinals  LUMBAR ROM: WNL, mildly pinches with ext, pulls with L lat flex  LOWER EXTREMITY ROM:   WNL, pulls in back with HS stretch   LOWER EXTREMITY MMT:  4-/5 throughout, mildly painful on R with hip flex and ext.   LUMBAR SPECIAL TESTS:  Slump test: Negative  GAIT: Distance walked: In clinic distances Assistive device utilized: None Level of assistance: Complete Independence Comments: No deviations noted.  TODAY'S TREATMENT:                                                                                                                              DATE:  01/23/23 Treadmill 2.8 mph x 5 min Sitting hamstring stretch x30 sec R&L Sitting piriformis stretch x 30 sec R&L Bridge with red TB around knees + bilat UE flexion 2x10 PPT x10 PPT with pball roll outs (knee flexed to extended) 2x10 Sidelying clamshell red TB 2x10 Sit<>stand with  hip hinge 2x10 Sitting on pball marching 2x10  01/16/23  Education Post pelvic tilt, isometric abd/add, bridging HS stretch in supine with ankle pumps for neural glide.  PATIENT EDUCATION:  Education details: HEP updates, Printmaker Person educated: Patient Education method: Holiday representative Education comprehension: verbalized understanding and returned demonstration  HOME EXERCISE PROGRAM: [id number]  ASSESSMENT:  CLINICAL IMPRESSION: Reviewed HEP and updated/progressed. Discussed desk ergonomics for reduced lumbar/midback strain. Worked on seated core work -- difficulty maintaining when marching R LE up.    GOALS: Goals reviewed with patient? Yes  SHORT TERM GOALS: Target date: 01/31/23  I with initial HEP Baseline: Goal status: INITIAL  LONG TERM GOALS: Target date: 03/13/23  I with final HEP Baseline:  Goal status: INITIAL  2.  Patient will be able to complete her daily tasks with no c/O back pain Baseline:  Goal status: INITIAL  3.  Patient will verbalize and demonstrate understanding of basic work station set up to provide adequate support and decrease strain in sitting. Baseline:  Goal status: INITIAL  4.  Increase B LE to 4+/5, and trunk strength and stability to hold appropriate posture during functional strengthening activities. Baseline:  Goal status: INITIAL PLAN:  PT FREQUENCY: 1x/week  PT DURATION: 8 weeks  PLANNED INTERVENTIONS: Therapeutic exercises, Therapeutic activity, Neuromuscular re-education, Balance training, Gait training, Patient/Family education, Self Care, Joint mobilization, Dry Needling, Electrical stimulation, Spinal mobilization, Cryotherapy, Moist heat, Ionotophoresis '4mg'$ /ml Dexamethasone, and Manual therapy.  PLAN FOR NEXT SESSION: Update HEP for strength, postural stability, stretch, STM to local area on back, discuss work station set up   [place], PT 01/23/2023, 8:05 AM

## 2023-01-26 ENCOUNTER — Encounter: Payer: Self-pay | Admitting: Obstetrics & Gynecology

## 2023-01-26 ENCOUNTER — Other Ambulatory Visit (HOSPITAL_COMMUNITY)
Admission: RE | Admit: 2023-01-26 | Discharge: 2023-01-26 | Disposition: A | Discharge status: Home / Self Care | Payer: BC Managed Care – PPO | Source: Ambulatory Visit | Attending: Obstetrics & Gynecology | Admitting: Obstetrics & Gynecology

## 2023-01-26 ENCOUNTER — Ambulatory Visit (INDEPENDENT_AMBULATORY_CARE_PROVIDER_SITE_OTHER): Payer: BC Managed Care – PPO | Admitting: Obstetrics & Gynecology

## 2023-01-26 VITALS — BP 120/78 | HR 77 | Ht 65.0 in | Wt 163.0 lb

## 2023-01-26 DIAGNOSIS — Z01419 Encounter for gynecological examination (general) (routine) without abnormal findings: Secondary | ICD-10-CM | POA: Diagnosis not present

## 2023-01-26 DIAGNOSIS — Z780 Asymptomatic menopausal state: Secondary | ICD-10-CM | POA: Diagnosis not present

## 2023-01-26 NOTE — Progress Notes (Addendum)
Laura Kirby 17-Jun-1956 [id number]   History:    67 y.o. G0 Boyfriend  RP:  Establish patient for annual gyn exam  HPI:  Postmenopause, well on no HRT.  No PMB x 02/2022, Sonohysto 06/2022 thin and symmetrical endometrium measuring 3.28 mm. H/O Endometrial Ablation.  No pelvic pain.  No pain with IC.  Per patient, Pap with previous MD was abnormal in 2023.  Pap reflex today.  Breasts normal.  Mammo Neg 09/2022.  Colono 06/2018, scheduled 06/2023.  Will schedule a Bone Density at [facility].  Declines Flu vaccine.  BMI 27.12.  Health Labs with Fam MD.   Past medical history,surgical history, family history and social history were all reviewed and documented in the EPIC chart.  Gynecologic History No LMP recorded. Patient is postmenopausal.  Obstetric History OB History  Gravida Para Term Preterm AB Living  0 0 0 0 0 0  SAB IAB Ectopic Multiple Live Births  0 0 0 0       ROS: A ROS was performed and pertinent positives and negatives are included in the history. GENERAL: No fevers or chills. HEENT: No change in vision, no earache, sore throat or sinus congestion. NECK: No pain or stiffness. CARDIOVASCULAR: No chest pain or pressure. No palpitations. PULMONARY: No shortness of breath, cough or wheeze. GASTROINTESTINAL: No abdominal pain, nausea, vomiting or diarrhea, melena or bright red blood per rectum. GENITOURINARY: No urinary frequency, urgency, hesitancy or dysuria. MUSCULOSKELETAL: No joint or muscle pain, no back pain, no recent trauma. DERMATOLOGIC: No rash, no itching, no lesions. ENDOCRINE: No polyuria, polydipsia, no heat or cold intolerance. No recent change in weight. HEMATOLOGICAL: No anemia or easy bruising or bleeding. NEUROLOGIC: No headache, seizures, numbness, tingling or weakness. PSYCHIATRIC: No depression, no loss of interest in normal activity or change in sleep pattern.     Exam:   BP 120/78 (BP Location: Right Arm, Patient Position: Sitting, Cuff Size: Normal)    Pulse 77   Ht 5\' 5"  (1.651 m)   Wt 163 lb (73.9 kg)   SpO2 97%   BMI 27.12 kg/m   Body mass index is 27.12 kg/m.  General appearance : Well developed well nourished female. No acute distress HEENT: Eyes: no retinal hemorrhage or exudates,  Neck supple, trachea midline, no carotid bruits, no thyroidmegaly Lungs: Clear to auscultation, no rhonchi or wheezes, or rib retractions  Heart: Regular rate and rhythm, no murmurs or gallops Breast:Examined in sitting and supine position were symmetrical in appearance, no palpable masses or tenderness,  no skin retraction, no nipple inversion, no nipple discharge, no skin discoloration, no axillary or supraclavicular lymphadenopathy Abdomen: no palpable masses or tenderness, no rebound or guarding Extremities: no edema or skin discoloration or tenderness  Pelvic: Vulva: Vitilligo, otherwise normal             Vagina: No gross lesions or discharge  Cervix: No gross lesions or discharge.  Pap reflex done.  Uterus  AV, normal size, shape and consistency, non-tender and mobile  Adnexa  Without masses or tenderness  Anus: Normal   Assessment/Plan:  67 y.o. female for annual exam   1. Encounter for routine gynecological examination with Papanicolaou smear of cervix Postmenopause, well on no HRT.  No PMB x 02/2022, Sonohysto 06/2022 thin and symmetrical endometrium measuring 3.28 mm. H/O Endometrial Ablation.  No pelvic pain.  No pain with IC. Per patient, Pap with previous MD was abnormal in 2023.  Pap reflex today.  Breasts normal.  Mammo Neg 09/2022.  Colono 06/2018, scheduled 06/2023.  Will schedule a Bone Density at [facility].  Declines Flu vaccine.  BMI 27.12.  Health Labs with Fam MD. - Cytology - PAP( [place])  2. Postmenopause  Postmenopause, well on no HRT.  No PMB x 02/2022, Sonohysto 06/2022 thin and symmetrical endometrium measuring 3.28 mm. H/O Endometrial Ablation.  No pelvic pain.  No pain with IC.   Princess Bruins MD, 11:24 AM

## 2023-01-30 ENCOUNTER — Encounter: Payer: Self-pay | Admitting: Physical Therapy

## 2023-01-30 ENCOUNTER — Ambulatory Visit: Payer: BC Managed Care – PPO | Attending: Family Medicine | Admitting: Physical Therapy

## 2023-01-30 DIAGNOSIS — M6281 Muscle weakness (generalized): Secondary | ICD-10-CM | POA: Diagnosis not present

## 2023-01-30 DIAGNOSIS — M545 Low back pain, unspecified: Secondary | ICD-10-CM | POA: Diagnosis not present

## 2023-01-30 DIAGNOSIS — R278 Other lack of coordination: Secondary | ICD-10-CM | POA: Diagnosis not present

## 2023-01-30 NOTE — Therapy (Signed)
OUTPATIENT PHYSICAL THERAPY THORACOLUMBAR TREATMENT   Patient Name: Laura Kirby MRN: [id number] DOB:[birth year], 67 y.o., female Today's Date: 01/30/2023  END OF SESSION:  PT End of Session - 01/30/23 0819     Visit Number 3    Date for PT Re-Evaluation 03/13/23    PT Start Time 0819   late arrival   PT Stop Time 0845    PT Time Calculation (min) 26 min    Activity Tolerance Patient tolerated treatment well    Behavior During Therapy [facility] for tasks assessed/performed               Past Medical History:  Diagnosis Date   Anemia    history    Anxiety state, unspecified    on daily xanax for a couple of years in past   Arthritis    L AC joint/shoulder   Cataract    Depression    Fibroid uterus    GYN--Dr. Terri Piedra   GERD (gastroesophageal reflux disease)    EGD at [facility]   Hearing loss in left ear 2008   s/p surgery 12/2012, has persistent hearing loss   Hyperlipidemia    diet controlled   Ovarian cyst    Sickle cell trait ([place])    Past Surgical History:  Procedure Laterality Date   COLONOSCOPY     DILITATION & CURRETTAGE/HYSTROSCOPY WITH VERSAPOINT RESECTION N/A 09/02/2013   Procedure: DILATATION & CURETTAGE/HYSTEROSCOPY WITH VERSAPOINT RESECTION;  Surgeon: Princess Bruins, MD;  Location: [facility];  Service: Gynecology;  Laterality: N/A;   ENDOMETRIAL ABLATION     MYOMECTOMY     Dr. Dellis Filbert - abdominal myomectomy   POLYPECTOMY     PTOSIS REPAIR Bilateral 10/17/2015   STAPEDES SURGERY Left 01/26/13   at [facility]; otosclerosis; persistent L hearing loss after surgery   WISDOM TOOTH EXTRACTION  07/2015   Patient Active Problem List   Diagnosis Date Noted   Encounter for counseling 12/05/2022   Tubular adenoma of colon 08/25/2018   Internal hemorrhoids 06/16/2016   Anal fissure 06/16/2016   Incomplete RBBB 10/11/2015   Tension headache 03/10/2013   Depression, major, in remission ([place]) 03/04/2013   Pure hypercholesterolemia 03/04/2013   Anxiety state,  unspecified 11/17/2011   GERD 04/17/2009   ABDOMINAL PAIN RIGHT UPPER QUADRANT 04/17/2009    PCP: Rita Ohara, MD  REFERRING PROVIDER: Gregor Hams, MD  REFERRING DIAG:  Diagnosis  S39.012A (ICD-10-CM) - Lumbosacral strain, initial encounter    Rationale for Evaluation and Treatment: Rehabilitation  THERAPY DIAG:  Muscle weakness (generalized)  Other lack of coordination  Acute right-sided low back pain without sciatica  ONSET DATE: 01/08/23  SUBJECTIVE:  SUBJECTIVE STATEMENT: No problems with her back. "It happens once or twice a year."  Between episodes it's usually fine. Pt has been more conscious about her sitting posture for work. Sititng to standing has been getting easier.   PERTINENT HISTORY:  Per Dr Georgina Snell: low back pain started this morning when doing yoga. Patient locates pain to to mid right sided back pain. Patent does not have pain when sitting. Pain is better than it was this morning  Patient was previously seen by Dr. Georgina Snell on 12/04/2022 for right knee pain.  Radiating pain: no  LE numbness/tingling: no LE weakness: no Aggravates: standing up out of a chair. Going up the steps  Treatments tried: Aleve, Heat, Salon pas  PAIN:  Are you having pain? Yes: NPRS scale: 0/10 Pain location: R low back and hip Pain description: sharp and excruciating for several days Aggravating factors: Comes on unexpectedly, no specific movement. Relieving factors: Heat, Alleve, but she has been cautioned to avoid too much pain reliever.  PRECAUTIONS: None  WEIGHT BEARING RESTRICTIONS: No  FALLS:  Has patient fallen in last 6 months? No  LIVING ENVIRONMENT: Lives with: lives with their family Lives in: House/apartment Stairs: Yes: Internal: 14 steps; on right going up Difficulty managing  when her pain is acute. 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 She was going to the gym 3 times/week and going to yoga, but has not been exercising since September.  PLOF: Independent  PATIENT GOALS: Manage the pain when it flairs up.  NEXT MD VISIT: none scheduled  OBJECTIVE:   DIAGNOSTIC FINDINGS:  X-ray images lumbar spine obtained today personally and independently interpreted Mild spondylosis L4-5.  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  No acute fractures. Await formal radiology review  MUSCLE LENGTH: Hamstrings: Right 68 deg; Left 74 deg Thomas test: WFL B.  POSTURE: decreased lumbar lordosis  PALPATION: TTP in one specific spot on R lower lumbo/sacral paraspinals  LUMBAR ROM: WNL, mildly pinches with ext, pulls with L lat flex  LOWER EXTREMITY ROM:   WNL, pulls in back with HS stretch   LOWER EXTREMITY MMT:  4-/5 throughout, mildly painful on R with hip flex and ext.   LUMBAR SPECIAL TESTS:  Slump test: Negative  GAIT: Distance walked: In clinic distances Assistive device utilized: None Level of assistance: Complete Independence Comments: No deviations noted.  TODAY'S TREATMENT:                                                                                                                              DATE:  01/30/23 Treadmill 2.3 mph x 5 min Sitting hamstring stretch x30 sec R&L Sitting piriformis stretch x30 sec R&L PPT + marching x10 PPT double knee bend to heel tap 2x10 Bridge green TB around knees x10 Bridge green TB around knees with marching x10  Sidelying clamshell green TB 2x10  01/23/23 Treadmill 2.8 mph x 5 min Sitting hamstring stretch x30 sec R&L Sitting piriformis stretch x 30 sec R&L Bridge with red TB around knees + bilat UE flexion 2x10 PPT x10 PPT with pball roll outs (knee flexed to extended) 2x10 Sidelying clamshell red  TB 2x10 Sit<>stand with hip hinge 2x10 Sitting on pball marching 2x10  01/16/23  Education Post pelvic tilt, isometric abd/add, bridging HS stretch in supine with ankle pumps for neural glide.  PATIENT EDUCATION:  Education details: HEP updates, Printmaker Person educated: Patient Education method: Customer service manager Education comprehension: verbalized understanding and returned demonstration  HOME EXERCISE PROGRAM: [id number]  ASSESSMENT:  CLINICAL IMPRESSION: Progressed exercises to green TB. Able to tolerate more difficult core strengthening exercises. Pt's pain has remained well controlled. Discussed continuing strengthening to prevent future incidences of back going out. Will benefit going over lifting mechanics next session. Plan for d/c in next 1-2 session.    GOALS: Goals reviewed with patient? Yes  SHORT TERM GOALS: Target date: 01/31/23  I with initial HEP Baseline: Goal status: MET  LONG TERM GOALS: Target date: 03/13/23  I with final HEP Baseline:  Goal status: INITIAL  2.  Patient will be able to complete her daily tasks with no c/O back pain Baseline:  Goal status: MET  3.  Patient will verbalize and demonstrate understanding of basic work station set up to provide adequate support and decrease strain in sitting. Baseline:  Goal status: MET  4.  Increase B LE to 4+/5, and trunk strength and stability to hold appropriate posture during functional strengthening activities. Baseline:  Goal status: INITIAL PLAN:  PT FREQUENCY: 1x/week  PT DURATION: 8 weeks  PLANNED INTERVENTIONS: Therapeutic exercises, Therapeutic activity, Neuromuscular re-education, Balance training, Gait training, Patient/Family education, Self Care, Joint mobilization, Dry Needling, Electrical stimulation, Spinal mobilization, Cryotherapy, Moist heat, Ionotophoresis '4mg'$ /ml Dexamethasone, and Manual therapy.  PLAN FOR NEXT SESSION: Update HEP for strength, postural  stability, stretch, STM to local area on back   Nishawn Rotan April Ma L Jaryan [place], PT 01/30/2023, 8:21 AM

## 2023-02-02 ENCOUNTER — Encounter: Payer: Self-pay | Admitting: Obstetrics & Gynecology

## 2023-02-02 LAB — CYTOLOGY - PAP

## 2023-02-03 ENCOUNTER — Other Ambulatory Visit: Payer: Self-pay

## 2023-02-03 DIAGNOSIS — R87612 Low grade squamous intraepithelial lesion on cytologic smear of cervix (LGSIL): Secondary | ICD-10-CM

## 2023-02-03 NOTE — Telephone Encounter (Signed)
Per JJ: "Please inform the patient of her abnormal pap and set her up for a colposcopy with Dr Dellis Filbert."  Pt notified and voiced understanding. Additional documentation in pap result note. Will close this encounter.

## 2023-02-06 ENCOUNTER — Ambulatory Visit: Payer: BC Managed Care – PPO | Admitting: Physical Therapy

## 2023-02-13 ENCOUNTER — Ambulatory Visit: Payer: BC Managed Care – PPO | Admitting: Physical Therapy

## 2023-02-20 ENCOUNTER — Ambulatory Visit: Payer: BC Managed Care – PPO | Admitting: Physical Therapy

## 2023-02-26 ENCOUNTER — Telehealth: Payer: Self-pay | Admitting: Family Medicine

## 2023-02-26 NOTE — Telephone Encounter (Signed)
P.A. DEXLANSOPRAZOLE RENEWAL

## 2023-03-09 NOTE — Telephone Encounter (Signed)
P.A. approved til 02/26/24, sent mychart message

## 2023-03-12 IMAGING — CT CT CARDIAC CORONARY ARTERY CALCIUM SCORE
3 series · 14 of 20 positions shown, 15 images · non-contrast
Comparison: None.
COMPARISON: None.

Addendum:
EXAM:
OVER-READ INTERPRETATION  CT CHEST

The following report is an over-read performed by radiologist Dr.
Moneybagg Jumper [REDACTED] on 12/13/2021. This
over-read does not include interpretation of cardiac or coronary
anatomy or pathology. The coronary calcium score interpretation by
the cardiologist is attached.
CLINICAL DATA: Cardiovascular Disease Risk stratification
Coronary Calcium Score
TECHNIQUE: A gated, non-contrast computed tomography scan of the heart was
performed using 3mm slice thickness. Axial images were analyzed on a
dedicated workstation. Calcium scoring of the coronary arteries was
performed using the Agatston method.

[Series 3: ax ca scr 70% (id) · axial · 0.33mm/px · z∈[-238,-138]mm · 6 of 72 slices shown]
[im 11/72  vessel]
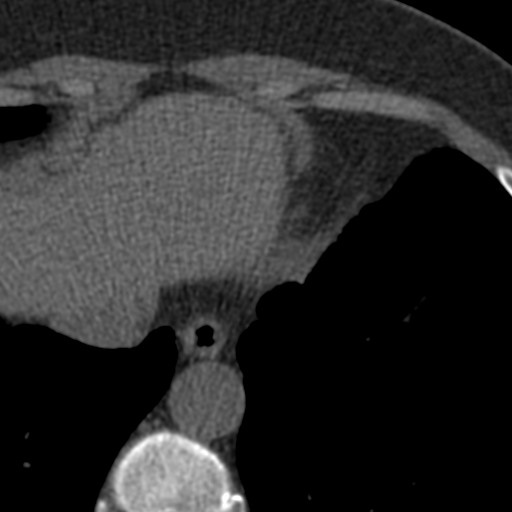
[im 21/72  vessel]
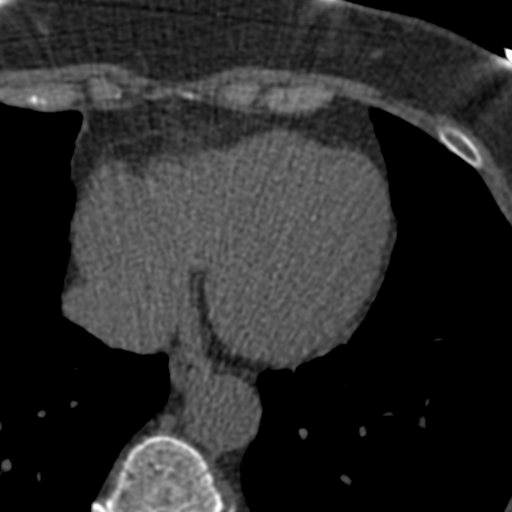
[im 31/72  vessel]
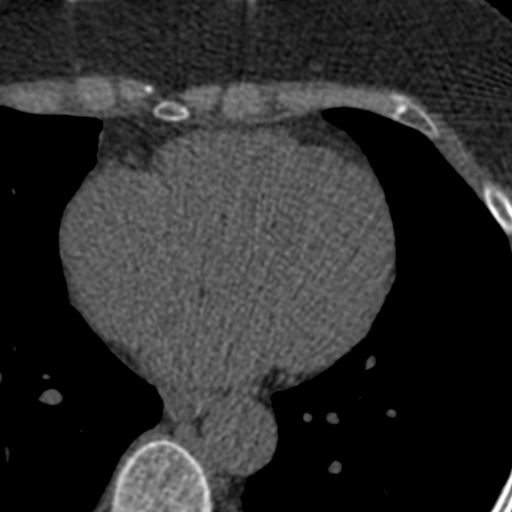
[im 41/72  vessel]
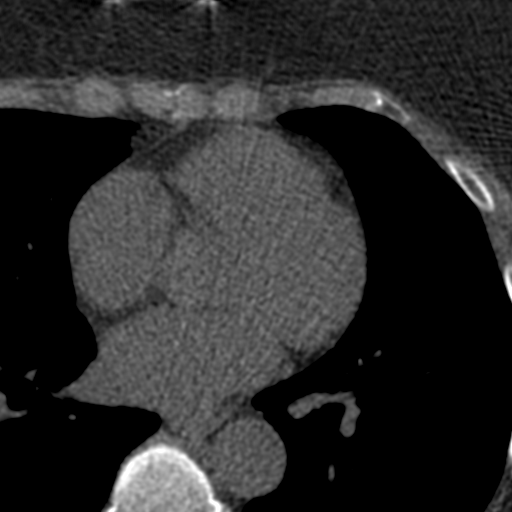
[im 51/72  vessel]
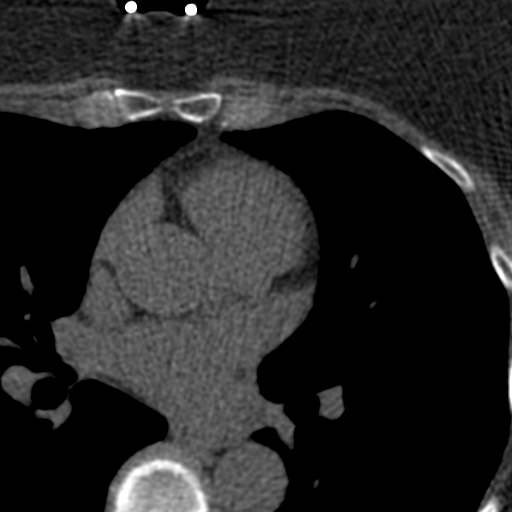
[im 61/72  vessel]
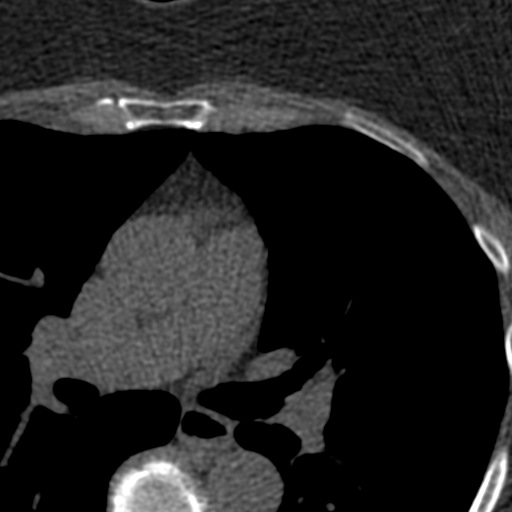

[Series 4: ax st · axial · 0.70mm/px · z∈[-230,-146]mm · 4 of 48 slices shown, 5 images]
[im 10/48  vessel]
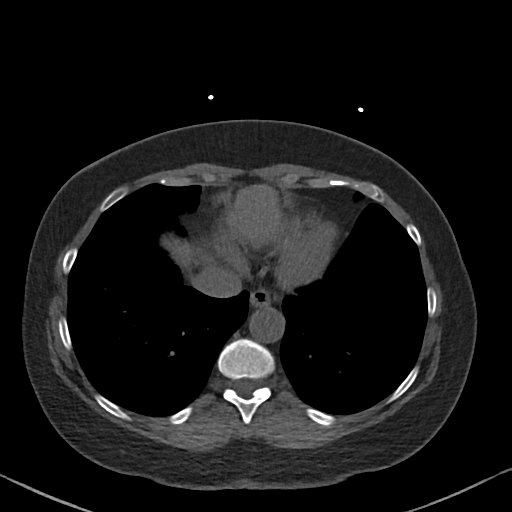
[im 10/48  lung]
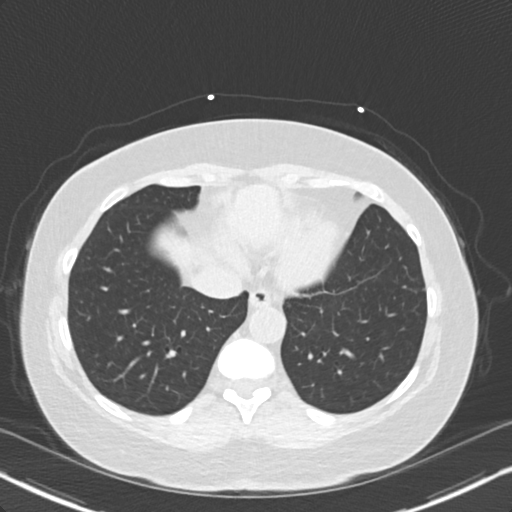
[im 19/48  vessel]
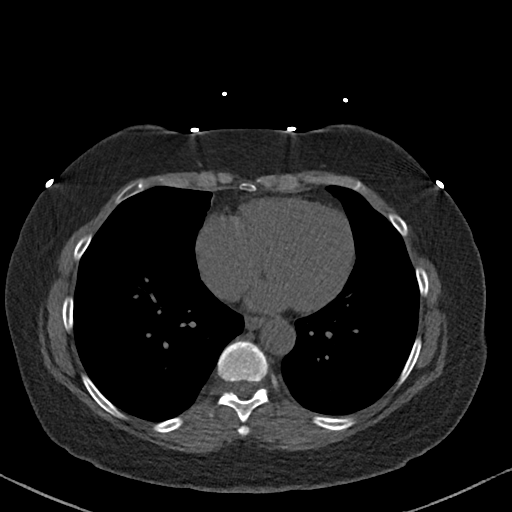
[im 29/48  vessel]
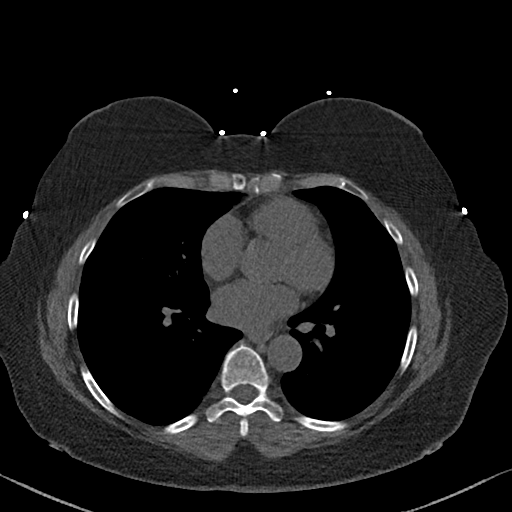
[im 38/48  vessel]
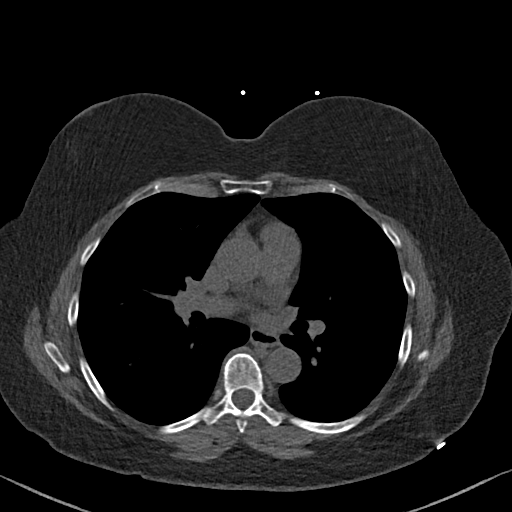

[Series 5: ax lung · axial · 0.56mm/px · z∈[-230,-146]mm · 4 of 48 slices shown]
[im 10/48  lung]
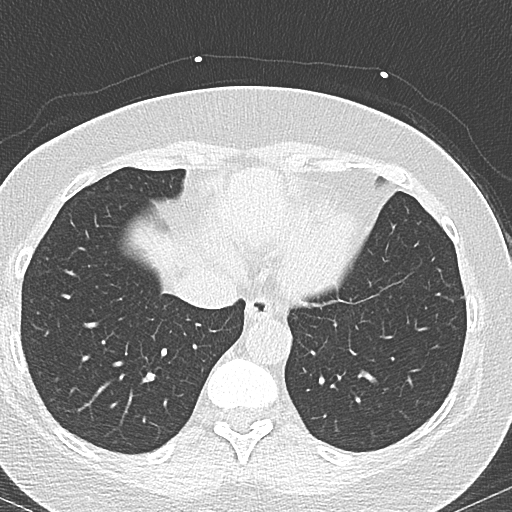
[im 19/48  lung]
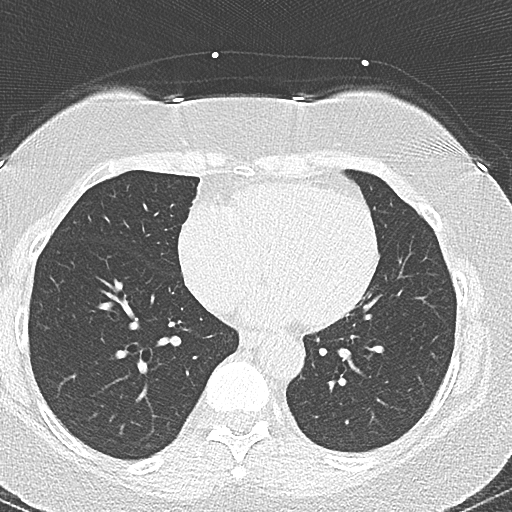
[im 29/48  lung]
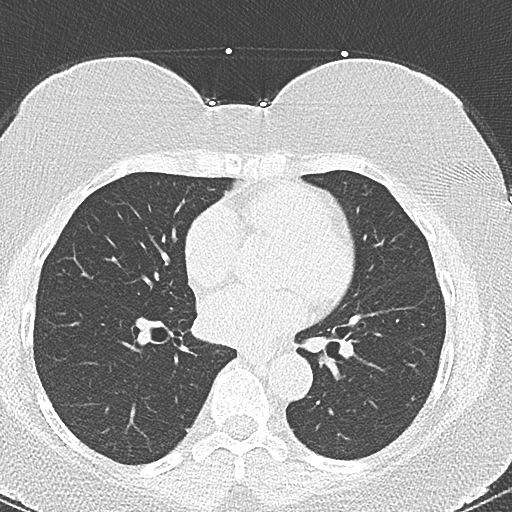
[im 38/48  lung]
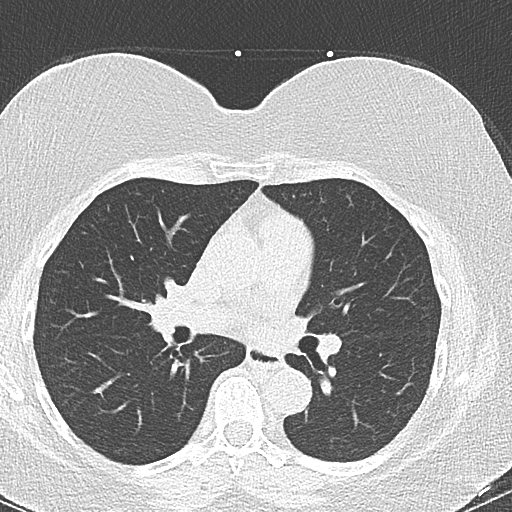

[14 of 20 positions shown; findings below may reference images not displayed]

FINDINGS: Vascular: No significant noncardiac vascular findings.

Mediastinum/Nodes: Visualized mediastinum and hilar regions
demonstrate no lymphadenopathy or masses.

Lungs/Pleura: Visualized lungs show no evidence of pulmonary edema,
consolidation, pneumothorax, nodule or pleural fluid.

Upper Abdomen: No acute abnormality.

Musculoskeletal: No chest wall mass or suspicious bone lesions
identified.
IMPRESSION: No significant incidental findings.
FINDINGS: Coronary arteries: Normal origins.

Coronary Calcium Score:

Total: 0

Percentile: 0

Pericardium: Normal.

Ascending Aorta: Normal caliber.

Non-cardiac: See separate report from [REDACTED].
IMPRESSION: Coronary calcium score of 0. This was 0 percentile for age-, race-,
and sex-matched controls.



If CAC=0, it is reasonable to withhold statin therapy and reassess
in 5 to 10 years, as long as higher risk conditions are absent
(diabetes mellitus, family history of premature CHD in first degree
relatives (males <55 years; females <65 years), cigarette smoking,
or LDL >=190 mg/dL).

If CAC is 1 to 99, it is reasonable to initiate statin therapy for
patients >=55 years of age.

If CAC is >=100 or >=75th percentile, it is reasonable to initiate
statin therapy at any age.

Cardiology referral should be considered for patients with CAC
scores >=400 or >=75th percentile.

*9400 AHA/ACC/AACVPR/AAPA/ABC/KARLEY/MAGALHAES/ALHARBI/[employer]/VENECIA/MOOLMAN/NWEEZY
Guideline on the Management of Blood Cholesterol: A Report of the
American College of Cardiology/American Heart Association Task Force
on Clinical Practice Guidelines. J Am Coll Cardiol.
5676;73(24):3363-3040.

*** End of Addendum ***
EXAM:
OVER-READ INTERPRETATION  CT CHEST

The following report is an over-read performed by radiologist Dr.
Moneybagg Jumper [REDACTED] on 12/13/2021. This
over-read does not include interpretation of cardiac or coronary
anatomy or pathology. The coronary calcium score interpretation by
the cardiologist is attached.
FINDINGS: Vascular: No significant noncardiac vascular findings.

Mediastinum/Nodes: Visualized mediastinum and hilar regions
demonstrate no lymphadenopathy or masses.

Lungs/Pleura: Visualized lungs show no evidence of pulmonary edema,
consolidation, pneumothorax, nodule or pleural fluid.

Upper Abdomen: No acute abnormality.

Musculoskeletal: No chest wall mass or suspicious bone lesions
identified.
IMPRESSION: No significant incidental findings.

## 2023-03-30 DIAGNOSIS — L089 Local infection of the skin and subcutaneous tissue, unspecified: Secondary | ICD-10-CM | POA: Diagnosis not present

## 2023-03-30 DIAGNOSIS — L658 Other specified nonscarring hair loss: Secondary | ICD-10-CM | POA: Diagnosis not present

## 2023-03-30 DIAGNOSIS — L668 Other cicatricial alopecia: Secondary | ICD-10-CM | POA: Diagnosis not present

## 2023-03-30 DIAGNOSIS — L219 Seborrheic dermatitis, unspecified: Secondary | ICD-10-CM | POA: Diagnosis not present

## 2023-04-02 ENCOUNTER — Encounter: Payer: Self-pay | Admitting: Obstetrics & Gynecology

## 2023-04-02 ENCOUNTER — Ambulatory Visit: Payer: BC Managed Care – PPO | Admitting: Obstetrics & Gynecology

## 2023-04-02 ENCOUNTER — Other Ambulatory Visit (HOSPITAL_COMMUNITY)
Admission: RE | Admit: 2023-04-02 | Discharge: 2023-04-02 | Disposition: A | Discharge status: Home / Self Care | Payer: BC Managed Care – PPO | Source: Ambulatory Visit | Attending: Obstetrics & Gynecology | Admitting: Obstetrics & Gynecology

## 2023-04-02 DIAGNOSIS — R87612 Low grade squamous intraepithelial lesion on cytologic smear of cervix (LGSIL): Secondary | ICD-10-CM

## 2023-04-02 DIAGNOSIS — N870 Mild cervical dysplasia: Secondary | ICD-10-CM | POA: Diagnosis not present

## 2023-04-02 NOTE — Progress Notes (Signed)
    Laura Kirby 1956-11-21 [id number]        67 y.o.  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  Per patient, Pap with Previous MD was abnormal in 2023.  New boyfriend at that time.   OB History  Gravida Para Term Preterm AB Living  0 0 0 0 0 0  SAB IAB Ectopic Multiple Live Births  0 0 0 0      Past medical history,surgical history, problem list, medications, allergies, family history and social history were all reviewed and documented in the EPIC chart.   Directed ROS with pertinent positives and negatives documented in the history of present illness/assessment and plan.  Exam:  Vitals:   04/02/23 1533  BP: 118/80  Pulse: 91  Resp: 16   General appearance:  Normal  Colposcopy Procedure Note Laura Kirby 04/02/2023  Indications: LGSIL  Procedure Details  The risks and benefits of the procedure and Written informed consent obtained.  Speculum placed in vagina and excellent visualization of cervix achieved, cervix swabbed x 3 with acetic acid solution.  Findings:  Cervix colposcopy: Physical Exam Genitourinary:        Vaginal colposcopy: Normal  Vulvar colposcopy: Normal  Perirectal colposcopy: Normal  The cervix was sprayed with Hurricane before performing the cervical biopsies.  Specimens: HPV HR/HPV 16-18-45.  Cervical Bx at 11 O'Clock.  Complications:  None, good hemostasis with Silver Nitrate . Plan:  Management per results   Assessment/Plan:  67 y.o. G0  1. LGSIL on Pap smear of cervix Pap 01/26/23 LGSIL.  Per patient, Pap with Previous MD was abnormal in 2023.  New boyfriend at that time.  Counseling on abnormal Pap.  HPV HR discussed.  Colposcopy procedure reviewed.  Colpo findings explained.  Management per Patho results.  Post procedure precautions. - Colposcopy - Cervicovaginal ancillary only( [place]) - Surgical pathology( [place]/ [facility])  Other orders - clobetasol ointment  (TEMOVATE) 0.05 %; Apply to affected area on scalp 4 times weekly (Patient not taking: Reported on 04/02/2023) - ketoconazole (NIZORAL) 2 % shampoo; Apply topically. (Patient not taking: Reported on 04/02/2023) - minoxidil (LONITEN) 2.5 MG tablet; Take by mouth. (Patient not taking: Reported on 04/02/2023)   Princess Bruins MD, 3:47 PM 04/02/2023

## 2023-04-02 NOTE — Progress Notes (Signed)
   I, Stevenson Clinch, CMA acting as a scribe for Clementeen Graham, MD.  Laura Kirby is a 67 y.o. female who presents to [facility] today for L knee pain. Pt was last seen by Dr. Denyse Amass on 12/04/22 for her R knee and had a Baker's cyst aspirated and injected.  Today, pt reports left knee pain. Sx started over the past couple of days after getting back from [place]. Locates pain to medial and posterior aspects of the knee. Worse with deep flexion and ascending stairs. Mils swelling.   Knee swelling: yes Mechanical symptoms: yes Aggravates: deep flexion, ascending stairs Treatments tried: ice    Pertinent review of systems: No fevers or chills  Relevant historical information: Hypercholesterolemia.  Tension headache.   Exam:  BP 108/64   Pulse 89   Ht 5\' 5"  (1.651 m)   Wt 165 lb 6.4 oz (75 kg)   SpO2 96%   BMI 27.52 kg/m  General: Well Developed, well nourished, and in no acute distress.   MSK: Left knee: Mild effusion otherwise normal appearing.  Normal motion.  TTP medial joint line.  Intact strength.     Lab and Radiology Results  Procedure: Real-time Ultrasound Guided Injection of left knee joint superior lateral patellar space  Device: Philips Affiniti 50G Images permanently stored and available for review in PACS Verbal informed consent obtained.  Discussed risks and benefits of procedure. Warned about infection, bleeding, hyperglycemia damage to structures among others. Patient expresses understanding and agreement Time-out conducted.   Noted no overlying erythema, induration, or other signs of local infection.   Skin prepped in a sterile fashion.   Local anesthesia: Topical Ethyl chloride.   With sterile technique and under real time ultrasound guidance:  40mg  kenalog and 77ml marcaine injected into left knee joint. Fluid seen entering the joint capsule.   Completed without difficulty   Pain immediately resolved suggesting  accurate placement of the medication.   Advised to call if fevers/chills, erythema, induration, drainage, or persistent bleeding.   Images permanently stored and available for review in the ultrasound unit.  Impression: Technically successful ultrasound guided injection.        Assessment and Plan: 67 y.o. female with Left knee pain due to exacerbation of DJD with recent increased activity level.  Plan for steroid injection today.  Recheck PRN.   PDMP not reviewed this encounter. Orders Placed This Encounter  Procedures   [place] LIMITED JOINT SPACE STRUCTURES LOW LEFT(NO LINKED CHARGES)    Order Specific Question:   Reason for Exam (SYMPTOM  OR DIAGNOSIS REQUIRED)    Answer:   left knee pain    Order Specific Question:   Preferred imaging location?    Answer:   [facility]   No orders of the defined types were placed in this encounter.    Discussed warning signs or symptoms. Please see discharge instructions. Patient expresses understanding.   The above documentation has been reviewed and is accurate and complete Clementeen Graham, M.D.

## 2023-04-03 ENCOUNTER — Ambulatory Visit: Payer: BC Managed Care – PPO | Admitting: Family Medicine

## 2023-04-03 ENCOUNTER — Other Ambulatory Visit: Payer: Self-pay

## 2023-04-03 VITALS — BP 108/64 | HR 89 | Ht 65.0 in | Wt 165.4 lb

## 2023-04-03 DIAGNOSIS — M25562 Pain in left knee: Secondary | ICD-10-CM | POA: Diagnosis not present

## 2023-04-03 NOTE — Patient Instructions (Addendum)
Thank you for coming in today.   Please use Voltaren gel (Generic Diclofenac Gel) up to 4x daily for pain as needed.  This is available over-the-counter as both the name brand Voltaren gel and the generic diclofenac gel.   Let me know if you do not get better.

## 2023-04-06 DIAGNOSIS — Z713 Dietary counseling and surveillance: Secondary | ICD-10-CM | POA: Diagnosis not present

## 2023-04-06 DIAGNOSIS — Z6826 Body mass index (BMI) 26.0-26.9, adult: Secondary | ICD-10-CM | POA: Diagnosis not present

## 2023-04-06 DIAGNOSIS — E663 Overweight: Secondary | ICD-10-CM | POA: Diagnosis not present

## 2023-04-06 LAB — SURGICAL PATHOLOGY

## 2023-04-07 LAB — CERVICOVAGINAL ANCILLARY ONLY
Comment: NEGATIVE
Comment: NEGATIVE
Comment: NEGATIVE
HPV 16: NEGATIVE
HPV 18 / 45: NEGATIVE
High risk HPV: POSITIVE — AB

## 2023-04-28 ENCOUNTER — Ambulatory Visit: Payer: BC Managed Care – PPO | Admitting: Internal Medicine

## 2023-05-11 ENCOUNTER — Encounter: Payer: Self-pay | Admitting: Gastroenterology

## 2023-05-26 NOTE — Progress Notes (Unsigned)
NEW PATIENT Date of Service/Encounter:  05/28/23 Referring provider: none-self referred Primary care provider: Joselyn Arrow, MD  Subjective:  Laura Kirby is a 67 y.o. female with a PMHx of GERD, anxiety, depression, hypercholesterolemia, tension headaches, colon adenomas, presenting today for evaluation of chronic rhinitis. History obtained from: chart review and patient.   Chronic rhinitis: started around 5 years ago Symptoms include: headaches in spring, stuffy nose, sneezing, coughing, occasional watery/itchy eyes Occurs  March to mid-August Potential triggers: pollen Treatments tried: claritin daily-controlled on this regimen Previous allergy testing: no History of reflux/heartburn:  yes-dexilant, sympoms are controlled Previous sinus, ear, tonsil, adenoid surgeries: no  History of allergy to Augmentin: Made her nauseous. No other symptoms. Many years ago.  Other allergy screening: Asthma: no Food allergy: no Hymenoptera allergy: no Eczema:no History of recurrent infections suggestive of immunodeficency: no Vaccinations are up to date.   Past Medical History: Past Medical History:  Diagnosis Date   Abnormal Pap smear of cervix    Anemia    history    Anxiety state, unspecified    on daily xanax for a couple of years in past   Arthritis    L AC joint/shoulder   Cataract    Depression    Fibroid uterus    GYN--Dr. 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     Biotin 5000 MCG CAPS Take 1 capsule by mouth daily.     Calcium Carbonate-Vit D-Min (CALCIUM 1200 PO) Take 1 each by mouth daily.     clobetasol ointment (TEMOVATE)  0.05 %      cyproheptadine (PERIACTIN) 4 MG tablet Take 1 tablet (4 mg total) by mouth at bedtime as needed. for sleep 90 tablet 3   dexlansoprazole (DEXILANT) 60 MG capsule Take 1 capsule (60 mg total) by mouth daily. 30 capsule 11   escitalopram (LEXAPRO) 10 MG tablet Take 1 tablet (10 mg total) by mouth daily. 90 tablet 3   folic acid (FOLVITE) 400 MCG tablet Take 400 mcg by mouth daily.     glucosamine-chondroitin 500-400 MG tablet Take 1 tablet by mouth daily.     loratadine (CLARITIN) 10 MG tablet Take 10 mg by mouth as needed.     Magnesium 500 MG TABS Take 1 tablet by mouth daily.     minoxidil (LONITEN) 2.5 MG tablet Take by mouth.     Probiotic Product (RESTORA) CAPS Take 1 capsule by mouth daily.     Turmeric 500 MG TABS Take 500 mg by mouth.     vitamin D, CHOLECALCIFEROL, 400 UNITS tablet Take 400 Units by mouth daily.     vitamin E 100 UNIT capsule Take 100 Units by mouth daily.     zinc gluconate 50 MG tablet Take 50 mg by mouth daily.     No current facility-administered medications for this visit.   Known Allergies:  Allergies  Allergen Reactions   Amoxicillin-Pot Clavulanate Diarrhea and Nausea Only   Past Surgical History: Past Surgical History:  Procedure Laterality Date   COLONOSCOPY     DILITATION & CURRETTAGE/HYSTROSCOPY WITH VERSAPOINT RESECTION N/A 09/02/2013   Procedure:  DILATATION & CURETTAGE/HYSTEROSCOPY WITH VERSAPOINT RESECTION;  Surgeon: Genia Del, MD;  Location: [facility];  Service: Gynecology;  Laterality: N/A;   ENDOMETRIAL ABLATION     MYOMECTOMY     Dr. 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   WISDOM TOOTH EXTRACTION  07/2015   Family History: Family History  Problem Relation Age of Onset   Hyperlipidemia Mother    Hypertension Mother    Diabetes Mother    Dementia Mother    Irritable bowel  syndrome Mother    Stroke Mother 41   Alcohol abuse Father    Kidney disease Father    Ulcers Father    Allergies Daughter    Eczema Daughter    Cancer Maternal Aunt        breast cancer (60's)   Cancer Maternal Uncle 33       colon   Colon cancer Maternal Uncle 70   Diabetes Maternal Grandfather    Stroke Maternal Grandfather    Heart disease Maternal Grandmother    Cancer Paternal Aunt        ?unsure of type (stomach vs ovarian)   Colon polyps Neg Hx    Esophageal cancer Neg Hx    Rectal cancer Neg Hx    Stomach cancer Neg Hx    Social History: Laura Kirby lives in a home built in 2007, no water damage, carpet floors, gas eating, central AC, dog indoors, no roaches, not using dust mite protection, she works as a professor, 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   ROS:  All other systems negative except as noted per HPI.  Objective:  Blood pressure 100/70, pulse 60, temperature 97.9 F (36.6 C), temperature source Temporal, resp. rate 16, height 5\' 6"  (1.676 m), weight 165 lb 9.6 oz (75.1 kg), SpO2 98 %. Body mass index is 26.73 kg/m. Physical Exam:  General Appearance:  Alert, cooperative, no distress, appears stated age  Head:  Normocephalic, without obvious abnormality, atraumatic  Eyes:  Conjunctiva clear, EOM's intact  Nose: Nares normal, hypertrophic turbinates and normal mucosa  Throat: Lips, tongue normal; teeth and gums normal, normal posterior oropharynx  Neck: Supple, symmetrical  Lungs:   clear to auscultation bilaterally, Respirations unlabored, no coughing  Heart:  regular rate and rhythm and no murmur, Appears well perfused  Extremities: No edema  Skin: Skin color, texture, turgor normal and no rashes or lesions on visualized portions of skin  Neurologic: No gross deficits     Diagnostics: Skin Testing: Environmental allergy panel.  Adequate controls. Results discussed with patient/family.  Airborne Adult Perc - 05/28/23 1351     Time  Antigen Placed 1352    Allergen Manufacturer Waynette Buttery    Location Back    Number of Test 55    1. Control-Buffer 50% Glycerol Negative    2. Control-Histamine 3+    3. Bahia Negative    4. French Southern Territories Negative    5. Johnson Negative    6. Kentucky Blue Negative    7. Meadow Fescue Negative    8. Perennial Rye Negative    9. Timothy Negative    10. Ragweed Mix Negative    11. Cocklebur Negative    12. Plantain,  English Negative    13. Baccharis Negative    14. Dog Fennel Negative    15. Guernsey Thistle Negative  16. Lamb's Quarters Negative    17. Sheep Sorrell Negative    18. Rough Pigweed Negative    19. Marsh Elder, Rough Negative    20. Mugwort, Common Negative    21. Box, Elder Negative    22. Cedar, red Negative    23. Sweet Gum Negative    24. Pecan Pollen Negative    25. Pine Mix Negative    26. Walnut, Black Pollen Negative    27. Red Mulberry Negative    28. Ash Mix Negative    29. Birch Mix Negative    30. Beech American Negative    31. Cottonwood, Guinea-Bissau Negative    32. Hickory, White Negative    33. Maple Mix Negative    34. Oak, Guinea-Bissau Mix Negative    35. Sycamore Eastern Negative    36. Alternaria Alternata Negative    37. Cladosporium Herbarum Negative    38. Aspergillus Mix Negative    39. Penicillium Mix Negative    40. Bipolaris Sorokiniana (Helminthosporium) Negative    41. Drechslera Spicifera (Curvularia) Negative    42. Mucor Plumbeus Negative    43. Fusarium Moniliforme Negative    44. Aureobasidium Pullulans (pullulara) Negative    45. Rhizopus Oryzae Negative    46. Botrytis Cinera Negative    47. Epicoccum Nigrum Negative    48. Phoma Betae Negative    49. Dust Mite Mix Negative    50. Cat Hair 10,000 BAU/ml Negative    51.  Dog Epithelia Negative    52. Mixed Feathers Negative    53. Horse Epithelia Negative    54. Cockroach, German Negative    55. Tobacco Leaf Negative             Intradermal - 05/28/23 1422     Time Antigen  Placed 1425    Allergen Manufacturer Waynette Buttery    Location Arm    Number of Test 16    Control 3+    Brune[place] Dar[place] Negative    French Southern Territories Negative    Johnson Negative    7 Grass Negative    Ragweed Mix Negative    Weed Mix Negative    Tree Mix Negative    Mold 1 Negative    Mold 2 2+    Mold 3 Negative    Mold 4 Negative    Mite Mix Negative    Cat Negative    Dog Negative    Cockroach Negative    Other Omitted             Allergy testing results were read and interpreted by myself, documented by clinical staff.  Assessment and Plan  Chronic Rhinitis: determined to be Nonallergic: - allergy testing today: negative on skin testing, borderline to indoor molds on intradermals (not clinically relevant) - Symptom control: - continue claritin 10 mg daily  Let us know if not controlled with the above and we discuss other treatment options.   H/O augmentin allergy: - please schedule follow-up appt at your convenience for graded oral challenge to augmentin - please refrain from taking any antihistamines at least 3 days prior to this appointment  - around 80% of individuals outgrow this allergy in ~ 10 years and carrying it as a diagnosis can prevent you from getting proper therapy if needed  Follow up : as needed It was a pleasure meeting you in clinic today! Thank you for allowing me to participate in your care.  This note in its entirety was forwarded to the Provider  who requested this consultation.  Thank you for your kind referral. I appreciate the opportunity to take part in Shabrea's care. Please do not hesitate to contact me with questions.  Sincerely,  Tonny Bollman, MD [facility]

## 2023-05-28 ENCOUNTER — Ambulatory Visit: Payer: BC Managed Care – PPO | Admitting: Internal Medicine

## 2023-05-28 ENCOUNTER — Other Ambulatory Visit: Payer: Self-pay

## 2023-05-28 ENCOUNTER — Encounter: Payer: Self-pay | Admitting: Internal Medicine

## 2023-05-28 VITALS — BP 100/70 | HR 60 | Temp 97.9°F | Resp 16 | Ht 66.0 in | Wt 165.6 lb

## 2023-05-28 DIAGNOSIS — J310 Chronic rhinitis: Secondary | ICD-10-CM | POA: Diagnosis not present

## 2023-05-28 DIAGNOSIS — Z880 Allergy status to penicillin: Secondary | ICD-10-CM

## 2023-05-28 DIAGNOSIS — J300 Vasomotor rhinitis: Secondary | ICD-10-CM

## 2023-05-28 NOTE — Patient Instructions (Addendum)
Chronic Rhinitis: determined to be Nonallergic: - allergy testing today: negative on skin testing, borderline to indoor molds on intradermals (not clinically relevant) - Symptom control: - continue claritin 10 mg daily  Let us know if not controlled with the above and we discuss other treatment options.   H/O augmentin allergy: - please schedule follow-up appt at your convenience for graded oral challenge to augmentin - please refrain from taking any antihistamines at least 3 days prior to this appointment  - around 80% of individuals outgrow this allergy in ~ 10 years and carrying it as a diagnosis can prevent you from getting proper therapy if needed  Follow up : as needed It was a pleasure meeting you in clinic today! Thank you for allowing me to participate in your care.  Tonny Bollman, MD [facility]

## 2023-06-19 DIAGNOSIS — M792 Neuralgia and neuritis, unspecified: Secondary | ICD-10-CM | POA: Diagnosis not present

## 2023-06-19 DIAGNOSIS — B351 Tinea unguium: Secondary | ICD-10-CM | POA: Diagnosis not present

## 2023-07-06 ENCOUNTER — Encounter: Payer: Self-pay | Admitting: Gastroenterology

## 2023-07-06 ENCOUNTER — Ambulatory Visit (AMBULATORY_SURGERY_CENTER): Payer: BC Managed Care – PPO

## 2023-07-06 VITALS — Ht 65.0 in | Wt 160.0 lb

## 2023-07-06 DIAGNOSIS — Z1211 Encounter for screening for malignant neoplasm of colon: Secondary | ICD-10-CM

## 2023-07-06 MED ORDER — NA SULFATE-K SULFATE-MG SULF 17.5-3.13-1.6 GM/177ML PO SOLN: 1.0000 | Freq: Once | ORAL | 0 refills | Status: AC

## 2023-07-06 NOTE — Progress Notes (Signed)
Pre visit completed via phone call; Patient verified name, DOB, and address;  No egg or soy allergy known to patient;  No issues known to pt with past sedation with any surgeries or procedures; Patient denies ever being told they had issues or difficulty with intubation;  No FH of Malignant Hyperthermia; Pt is not on diet pills; Pt is not on home 02;  Pt is not on blood thinners;  Pt denies issues with constipation- takes Colace nightly, also advised to increase oral fluids, activity, fruits/veggies that are allowed;   No A fib or A flutter; Have any cardiac testing pending--NO Pt instructed to use [email or web address] or GoodRx for a price reduction on prep;   Insurance verified during PV appt=BCBS PPO  Patient's chart reviewed by Cathlyn Parsons CNRA prior to previsit and patient appropriate for the [facility].  Previsit completed and red dot placed by patient's name on their procedure day (on provider's schedule).    Instructions sent to patient via MyChart as well as printed out and mailed to the patient per her request; GoodRx coupon also printed and mailed to the patient;

## 2023-07-14 ENCOUNTER — Telehealth: Payer: Self-pay | Admitting: Gastroenterology

## 2023-07-14 MED ORDER — NA SULFATE-K SULFATE-MG SULF 17.5-3.13-1.6 GM/177ML PO SOLN
1.0000 | Freq: Once | ORAL | 0 refills | Status: AC
Start: 2023-07-14 — End: 2023-07-14

## 2023-07-14 NOTE — Telephone Encounter (Signed)
Call to pt, let her know I am sending the suprep to the [facility], pt verb understanding

## 2023-07-14 NOTE — Addendum Note (Signed)
Addended by: Mason Jim on: 07/14/2023 10:20 AM   Modules accepted: Orders

## 2023-07-14 NOTE — Telephone Encounter (Signed)
Patient needing prep medication sent into the pharmacy. Please advise.

## 2023-07-27 DIAGNOSIS — B351 Tinea unguium: Secondary | ICD-10-CM | POA: Diagnosis not present

## 2023-07-29 ENCOUNTER — Ambulatory Visit (AMBULATORY_SURGERY_CENTER): Payer: BC Managed Care – PPO | Admitting: Gastroenterology

## 2023-07-29 ENCOUNTER — Encounter: Payer: Self-pay | Admitting: Gastroenterology

## 2023-07-29 VITALS — BP 107/65 | HR 59 | Temp 97.1°F | Resp 11 | Ht 66.0 in | Wt 160.0 lb

## 2023-07-29 DIAGNOSIS — Z86010 Personal history of colonic polyps: Secondary | ICD-10-CM | POA: Diagnosis not present

## 2023-07-29 DIAGNOSIS — Z1211 Encounter for screening for malignant neoplasm of colon: Secondary | ICD-10-CM | POA: Diagnosis not present

## 2023-07-29 DIAGNOSIS — Z09 Encounter for follow-up examination after completed treatment for conditions other than malignant neoplasm: Secondary | ICD-10-CM | POA: Diagnosis not present

## 2023-07-29 MED ORDER — SODIUM CHLORIDE 0.9 % IV SOLN
500.0000 mL | Freq: Once | INTRAVENOUS | Status: DC
Start: 2023-07-29 — End: 2023-07-29

## 2023-07-29 NOTE — Progress Notes (Signed)
Pt's states no medical or surgical changes since previsit or office visit. 

## 2023-07-29 NOTE — Patient Instructions (Signed)
Discharge instructions given. Handouts on Diverticulosis. Resume previous medications. YOU HAD AN ENDOSCOPIC PROCEDURE TODAY AT THE [facility]:   Refer to the procedure report that was given to you for any specific questions about what was found during the examination.  If the procedure report does not answer your questions, please call your gastroenterologist to clarify.  If you requested that your care partner not be given the details of your procedure findings, then the procedure report has been included in a sealed envelope for you to review at your convenience later.  YOU SHOULD EXPECT: Some feelings of bloating in the abdomen. Passage of more gas than usual.  Walking can help get rid of the air that was put into your GI tract during the procedure and reduce the bloating. If you had a lower endoscopy (such as a colonoscopy or flexible sigmoidoscopy) you may notice spotting of blood in your stool or on the toilet paper. If you underwent a bowel prep for your procedure, you may not have a normal bowel movement for a few days.  Please Note:  You might notice some irritation and congestion in your nose or some drainage.  This is from the oxygen used during your procedure.  There is no need for concern and it should clear up in a day or so.  SYMPTOMS TO REPORT IMMEDIATELY:  Following lower endoscopy (colonoscopy or flexible sigmoidoscopy):  Excessive amounts of blood in the stool  Significant tenderness or worsening of abdominal pains  Swelling of the abdomen that is new, acute  Fever of 100F or higher   For urgent or emergent issues, a gastroenterologist can be reached at any hour by calling [phone]. Do not use MyChart messaging for urgent concerns.    DIET:  We do recommend a small meal at first, but then you may proceed to your regular diet.  Drink plenty of fluids but you should avoid alcoholic beverages for 24 hours.  ACTIVITY:  You should plan to take it easy for  the rest of today and you should NOT DRIVE or use heavy machinery until tomorrow (because of the sedation medicines used during the test).    FOLLOW UP: Our staff will call the number listed on your records the next business day following your procedure.  We will call around 7:15- 8:00 am to check on you and address any questions or concerns that you may have regarding the information given to you following your procedure. If we do not reach you, we will leave a message.     If any biopsies were taken you will be contacted by phone or by letter within the next 1-3 weeks.  Please call us at [phone] if you have not heard about the biopsies in 3 weeks.    SIGNATURES/CONFIDENTIALITY: You and/or your care partner have signed paperwork which will be entered into your electronic medical record.  These signatures attest to the fact that that the information above on your After Visit Summary has been reviewed and is understood.  Full responsibility of the confidentiality of this discharge information lies with you and/or your care-partner.

## 2023-07-29 NOTE — [place] Notes (Signed)
History & Physical  Primary Care Physician:  Joselyn Arrow, MD Primary Gastroenterologist: Claudette Head, MD  Impression / Plan:  Personal personal history of adenomatous and sessile serrated colon polyps for surveillance colonoscopy  CHIEF COMPLAINT: Personal history of colon polyps   HPI: Laura Kirby is a 67 y.o. female with a personal history of adenomatous and sessile serrated colon polyps for surveillance colonoscopy.    Past Medical History:  Diagnosis Date   Abnormal Pap smear of cervix    Anemia    hx of   Anxiety state, unspecified    on daily xanax for a couple of years in past   Arthritis    L AC joint/shoulder   Cataract    not a surgical candidate at this time (07/06/2023)   Depression    Fibroid uterus    GYN--Dr. Mindi Slicker   GERD (gastroesophageal reflux disease)    EGD at [facility]- on meds   Hearing loss in left ear 2008   s/p surgery 12/2012, has persistent hearing loss   Hyperlipidemia    diet controlled   Ovarian cyst    Seasonal allergies    Sickle cell trait (HCC)     Past Surgical History:  Procedure Laterality Date   COLONOSCOPY  2019   MD-MAC-prep good-TA   DILITATION & CURRETTAGE/HYSTROSCOPY WITH VERSAPOINT RESECTION N/A 09/02/2013   Procedure: DILATATION & CURETTAGE/HYSTEROSCOPY WITH VERSAPOINT RESECTION;  Surgeon: Genia Del, MD;  Location: [facility];  Service: Gynecology;  Laterality: N/A;   ENDOMETRIAL ABLATION     MYOMECTOMY     Dr. Seymour Bars - abdominal myomectomy   POLYPECTOMY  2019   TA   PTOSIS REPAIR Bilateral 10/17/2015   STAPEDES SURGERY Left 01/26/2013   at [facility]; otosclerosis; persistent L hearing loss after surgery   WISDOM TOOTH EXTRACTION  07/2015    Prior to Admission medications   Medication Sig Start Date End Date Taking? Authorizing Provider  Apoaequorin (PREVAGEN PO) Take 1 capsule by mouth daily.   Yes [provider]  Biotin 5000 MCG CAPS Take 1 capsule by mouth daily.   Yes [provider]  Calcium Carbonate-Vit D-Min (CALCIUM 1200 PO) Take 1 each by mouth daily.   Yes [provider]  cyproheptadine (PERIACTIN) 4 MG tablet Take 1 tablet (4 mg total) by mouth at bedtime as needed. for sleep 11/19/22  Yes Joselyn Arrow, MD  escitalopram (LEXAPRO) 10 MG tablet Take 1 tablet (10 mg total) by mouth daily. 11/19/22  Yes Joselyn Arrow, MD  folic acid (FOLVITE) 400 MCG tablet Take 400 mcg by mouth daily.   Yes [provider]  glucosamine-chondroitin 500-400 MG tablet Take 1 tablet by mouth daily.   Yes [provider]  Magnesium 500 MG TABS Take 1 tablet by mouth daily.   Yes [provider]  minoxidil (LONITEN) 2.5 MG tablet Take by mouth. 03/30/23 03/29/24 Yes [provider]  Turmeric 500 MG TABS Take 500 mg by mouth.   Yes [provider]  vitamin D, CHOLECALCIFEROL, 400 UNITS tablet Take 400 Units by mouth daily.   Yes [provider]  vitamin E 100 UNIT capsule Take 100 Units by mouth daily.   Yes [provider]  zinc gluconate 50 MG tablet Take 50 mg by mouth daily.   Yes [provider]  ciclopirox (PENLAC) 8 % solution Apply 1 Application topically daily.    [provider]  clobetasol ointment (TEMOVATE) 0.05 %  03/30/23   [provider]  dexlansoprazole (  DEXILANT) 60 MG capsule Take 1 capsule (60 mg total) by mouth daily. Patient taking differently: Take 1 capsule by mouth daily. Takes three times weekly 11/19/22   Joselyn Arrow, MD  loratadine (CLARITIN) 10 MG tablet Take 10 mg by mouth daily as needed for allergies.    [provider]  Probiotic Product (RESTORA) CAPS Take 1 capsule by mouth daily.    [provider]    Current Outpatient Medications  Medication Sig Dispense Refill   Apoaequorin (PREVAGEN PO) Take 1 capsule by mouth daily.     Biotin 5000 MCG CAPS Take 1 capsule by mouth daily.     Calcium Carbonate-Vit D-Min (CALCIUM 1200 PO) Take 1 each by mouth  daily.     cyproheptadine (PERIACTIN) 4 MG tablet Take 1 tablet (4 mg total) by mouth at bedtime as needed. for sleep 90 tablet 3   escitalopram (LEXAPRO) 10 MG tablet Take 1 tablet (10 mg total) by mouth daily. 90 tablet 3   folic acid (FOLVITE) 400 MCG tablet Take 400 mcg by mouth daily.     glucosamine-chondroitin 500-400 MG tablet Take 1 tablet by mouth daily.     Magnesium 500 MG TABS Take 1 tablet by mouth daily.     minoxidil (LONITEN) 2.5 MG tablet Take by mouth.     Turmeric 500 MG TABS Take 500 mg by mouth.     vitamin D, CHOLECALCIFEROL, 400 UNITS tablet Take 400 Units by mouth daily.     vitamin E 100 UNIT capsule Take 100 Units by mouth daily.     zinc gluconate 50 MG tablet Take 50 mg by mouth daily.     ciclopirox (PENLAC) 8 % solution Apply 1 Application topically daily.     clobetasol ointment (TEMOVATE) 0.05 %      dexlansoprazole (DEXILANT) 60 MG capsule Take 1 capsule (60 mg total) by mouth daily. (Patient taking differently: Take 1 capsule by mouth daily. Takes three times weekly) 30 capsule 11   loratadine (CLARITIN) 10 MG tablet Take 10 mg by mouth daily as needed for allergies.     Probiotic Product (RESTORA) CAPS Take 1 capsule by mouth daily.     Current Facility-Administered Medications  Medication Dose Route Frequency Provider Last Rate Last Admin   0.9 %  sodium chloride infusion  500 mL Intravenous Once Meryl Dare, MD        Allergies as of 07/29/2023 - Review Complete 07/29/2023  Allergen Reaction Noted   Amoxicillin-pot clavulanate Diarrhea and Nausea Only 11/17/2011    Family History  Problem Relation Age of Onset   Hyperlipidemia Mother    Hypertension Mother    Diabetes Mother    Dementia Mother    Irritable bowel syndrome Mother    Stroke Mother 18   Alcohol abuse Father    Kidney disease Father    Ulcers Father    Cancer Maternal Aunt        breast cancer (60's)   Colon cancer Maternal Uncle 31   Colon polyps Maternal Uncle 37    Cancer Paternal Aunt        ?unsure of type (stomach vs ovarian)   Heart disease Maternal Grandmother    Diabetes Maternal Grandfather    Stroke Maternal Grandfather    Allergies Daughter    Eczema Daughter    Esophageal cancer Neg Hx    Rectal cancer Neg Hx    Stomach cancer Neg Hx     Social History   Socioeconomic History  Marital status: Legally Separated    Spouse name: Not on file   Number of children: 1   Years of education: Not on file   Highest education level: Not on file  Occupational History   Occupation: works from home.  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 In 06/2011.     Separated in 2017, in process of getting divorce.   Lives with her Schnoodle Fredric Mare) puppy 11/2017.   Daughter lives in [place].    Works for [employer] for [employer], doing Astronomer).  Still teaching online course.   Fostering a 66yo girl.      Updated 10/2022   Social Determinants of Health   Financial Resource Strain: Not on file  Food Insecurity: Not on file  Transportation Needs: Not on file  Physical Activity: Not on file  Stress: Not on file  Social Connections: Unknown (05/09/2022)   Received from [facility], [facility]   Social Network    Social Network: Not on file  Intimate Partner Violence: Unknown (04/01/2022)   Received from [facility], [facility]   HITS    Physically Hurt: Not on file    Insult or Talk Down To: Not on file    Threaten Physical Harm:  Not on file    Scream or Curse: Not on file    Review of Systems:  All systems reviewed were negative except where noted in HPI.   Physical Exam:  General:  Alert, well-developed, in NAD Head:  Normocephalic and atraumatic. Eyes:  Sclera clear, no icterus.   Conjunctiva pink. Ears:  Normal auditory acuity. Mouth:  No deformity or lesions.  Neck:  Supple; no masses. Lungs:  Clear throughout to auscultation.   No wheezes, crackles, or rhonchi.  Heart:  Regular rate and rhythm; no murmurs. Abdomen:  Soft, nondistended, nontender. No masses, hepatomegaly. No palpable masses.  Normal bowel sounds.    Rectal:  Deferred   Msk:  Symmetrical without gross deformities. Extremities:  Without edema. Neurologic:  Alert and  oriented x 4; grossly normal neurologically. Skin:  Intact without significant lesions or rashes. Psych:  Alert and cooperative. Normal mood and affect.   Venita Lick. Russella Dar  07/29/2023, 9:19 AM See Loretha Stapler, [facility], to contact our on call provider

## 2023-07-29 NOTE — [place] Notes (Signed)
Uneventful anesthetic. Report to pacu rn. Vss. Care resumed by rn. 

## 2023-07-29 NOTE — Op Note (Signed)
[facility] Patient Name: Laura Kirby Procedure Date: 07/29/2023 9:22 AM MRN: [id number] Endoscopist: Meryl Dare , MD, [phone] Age: 67 Referring MD:  Date of Birth: [birth year] Gender: Female Account #: [id number] Procedure:                Colonoscopy Indications:              Surveillance: Personal history of adenomatous and                            sessile serrated colon polyps on last colonoscopy 5                            years ago Medicines:                Monitored Anesthesia Care Procedure:                Pre-Anesthesia Assessment:                           - Prior to the procedure, a History and Physical                            was performed, and patient medications and                            allergies were reviewed. The patient's tolerance of                            previous anesthesia was also reviewed. The risks                            and benefits of the procedure and the sedation                            options and risks were discussed with the patient.                            All questions were answered, and informed consent                            was obtained. Prior Anticoagulants: The patient has                            taken no anticoagulant or antiplatelet agents. ASA                            Grade Assessment: II - A patient with mild systemic                            disease. After reviewing the risks and benefits,                            the patient was deemed in satisfactory condition to  undergo the procedure.                           After obtaining informed consent, the colonoscope                            was passed under direct vision. Throughout the                            procedure, the patient's blood pressure, pulse, and                            oxygen saturations were monitored continuously. The                            PCF-HQ190L Colonoscope [id number] was  introduced                            through the anus and advanced to the the cecum,                            identified by appendiceal orifice and ileocecal                            valve. The ileocecal valve, appendiceal orifice,                            and rectum were photographed. The quality of the                            bowel preparation was good. The colonoscopy was                            performed without difficulty. The patient tolerated                            the procedure well. Scope In: 9:25:52 AM Scope Out: 9:40:31 AM Scope Withdrawal Time: 0 hours 9 minutes 9 seconds  Total Procedure Duration: 0 hours 14 minutes 39 seconds  Findings:                 The perianal and digital rectal examinations were                            normal.                           A few medium-mouthed and small-mouthed diverticula                            were found in the left colon.                           The exam was otherwise without abnormality on  direct and retroflexion views. Complications:            No immediate complications. Estimated blood loss:                            None. Estimated Blood Loss:     Estimated blood loss: none. Impression:               - Mild diverticulosis in the left colon.                           - The examination was otherwise normal on direct                            and retroflexion views.                           - No specimens collected. Recommendation:           - Repeat colonoscopy in 5 years for surveillance.                           - Patient has a contact number available for                            emergencies. The signs and symptoms of potential                            delayed complications were discussed with the                            patient. Return to normal activities tomorrow.                            Written discharge instructions were provided to the                             patient.                           - High fiber diet.                           - Continue present medications. Meryl Dare, MD 07/29/2023 9:46:22 AM This report has been signed electronically.

## 2023-07-31 ENCOUNTER — Telehealth: Payer: Self-pay

## 2023-07-31 NOTE — Telephone Encounter (Signed)
  Follow up Call-     07/29/2023    8:50 AM  Call back number  Post procedure Call Back phone  # [phone]  Permission to leave phone message Yes     Patient questions:  Do you have a fever, pain , or abdominal swelling? No. Pain Score  0 *  Have you tolerated food without any problems? Yes.    Have you been able to return to your normal activities? Yes.    Do you have any questions about your discharge instructions: Diet   No. Medications  No. Follow up visit  No.  Do you have questions or concerns about your Care? No.  Actions: * If pain score is 4 or above: No action needed, pain <4.

## 2023-10-05 ENCOUNTER — Other Ambulatory Visit (HOSPITAL_COMMUNITY)
Admission: RE | Admit: 2023-10-05 | Discharge: 2023-10-05 | Disposition: A | Discharge status: Home / Self Care | Payer: BC Managed Care – PPO | Source: Ambulatory Visit | Attending: Obstetrics and Gynecology | Admitting: Obstetrics and Gynecology

## 2023-10-05 ENCOUNTER — Ambulatory Visit: Payer: BC Managed Care – PPO | Admitting: Obstetrics and Gynecology

## 2023-10-05 ENCOUNTER — Encounter: Payer: Self-pay | Admitting: Obstetrics and Gynecology

## 2023-10-05 VITALS — BP 110/70 | Ht 65.25 in | Wt 160.0 lb

## 2023-10-05 DIAGNOSIS — D219 Benign neoplasm of connective and other soft tissue, unspecified: Secondary | ICD-10-CM

## 2023-10-05 DIAGNOSIS — Z01419 Encounter for gynecological examination (general) (routine) without abnormal findings: Secondary | ICD-10-CM

## 2023-10-05 DIAGNOSIS — N83201 Unspecified ovarian cyst, right side: Secondary | ICD-10-CM

## 2023-10-05 DIAGNOSIS — E2839 Other primary ovarian failure: Secondary | ICD-10-CM

## 2023-10-05 DIAGNOSIS — Z1231 Encounter for screening mammogram for malignant neoplasm of breast: Secondary | ICD-10-CM

## 2023-10-05 DIAGNOSIS — N951 Menopausal and female climacteric states: Secondary | ICD-10-CM

## 2023-10-05 NOTE — [place] Notes (Signed)
67 y.o. y.o. female here for annual exam. She denies any PM bleeding.  Has known fibroids  No LMP recorded. Patient is postmenopausal.    Pelvic discharge: denies Pelvic pain: denies  Last mammogram: 2022 Last colonoscopy: 7/24 DXA: not done Body mass index is 26.42 kg/m.'  Blood pressure 110/70, height 5' 5.25" (1.657 m), weight 160 lb (72.6 kg).     Component Value Date/Time   DIAGPAP - Low grade squamous intraepithelial lesion (LSIL) (A) 01/26/2023 1142   HPVHIGH Positive (A) 04/02/2023 1612   ADEQPAP  01/26/2023 1142    Satisfactory for evaluation; transformation zone component PRESENT.   4/24 colpo:  Y SURGICAL PATHOLOGY CASE: [id number] PATIENT: Laura Kirby Surgical Pathology Report     Clinical History: LGSIL (cm)     FINAL MICROSCOPIC DIAGNOSIS:  A. CERVIX, 11 O'CLOCK, BIOPSY: Low-grade squamous intraepithelial lesion, CIN-1.   GYN HISTORY:    Component Value Date/Time   DIAGPAP - Low grade squamous intraepithelial lesion (LSIL) (A) 01/26/2023 1142   HPVHIGH Positive (A) 04/02/2023 1612   ADEQPAP  01/26/2023 1142    Satisfactory for evaluation; transformation zone component PRESENT.   New partner since last pap smear  OB History  Gravida Para Term Preterm AB Living  0 0 0 0 0 0  SAB IAB Ectopic Multiple Live Births  0 0 0 0      Past Medical History:  Diagnosis Date   Abnormal Pap smear of cervix    Anemia    hx of   Anxiety state, unspecified    on daily xanax for a couple of years in past   Arthritis    L AC joint/shoulder   Cataract    not a surgical candidate at this time (07/06/2023)   Depression    Fibroid uterus    GYN--Dr. Mindi Slicker   GERD (gastroesophageal reflux disease)    EGD at [facility]- on meds   Hearing loss in left ear 2008   s/p surgery 12/2012, has persistent hearing loss   Hyperlipidemia    diet controlled   Ovarian cyst    Seasonal allergies    Sickle cell trait (HCC)     Past Surgical  History:  Procedure Laterality Date   COLONOSCOPY  2019   MD-MAC-prep good-TA   DILITATION & CURRETTAGE/HYSTROSCOPY WITH VERSAPOINT RESECTION N/A 09/02/2013   Procedure: DILATATION & CURETTAGE/HYSTEROSCOPY WITH VERSAPOINT RESECTION;  Surgeon: Genia Del, MD;  Location: [facility];  Service: Gynecology;  Laterality: N/A;   ENDOMETRIAL ABLATION     MYOMECTOMY     Dr. 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     Biotin 5000 MCG CAPS Take 1 capsule by mouth daily.     Calcium Carbonate-Vit D-Min (CALCIUM 1200 PO) Take 1 each by mouth daily.     clobetasol ointment (TEMOVATE) 0.05 %      cyproheptadine (PERIACTIN) 4 MG tablet Take 1 tablet (4 mg total) by mouth at bedtime as needed. for sleep 90 tablet 3   dexlansoprazole (DEXILANT) 60 MG capsule Take 1 capsule (60 mg total) by mouth daily. (Patient taking differently: Take 1 capsule by mouth daily. Takes three  times weekly) 30 capsule 11   escitalopram (LEXAPRO) 10 MG tablet Take 1 tablet (10 mg total) by mouth daily. 90 tablet 3   folic acid (FOLVITE) 400 MCG tablet Take 400 mcg by mouth daily.     glucosamine-chondroitin 500-400 MG tablet Take 1 tablet by mouth daily.     loratadine (CLARITIN) 10 MG tablet Take 10 mg by mouth daily as needed for allergies.     Magnesium 500 MG TABS Take 1 tablet by mouth daily.     minoxidil (LONITEN) 2.5 MG tablet Take by mouth.     Probiotic Product (RESTORA) CAPS Take 1 capsule by mouth daily.     Turmeric 500 MG TABS Take 500 mg by mouth.     vitamin D, CHOLECALCIFEROL, 400 UNITS tablet Take 400 Units by mouth daily.     vitamin E 100 UNIT capsule Take 100 Units by  mouth daily.     zinc gluconate 50 MG tablet Take 50 mg by mouth daily.     No current facility-administered medications on file prior to visit.    Social History   Socioeconomic History   Marital status: Divorced    Spouse name: Not on file   Number of children: 1   Years of education: Not on file   Highest education level: Not on file  Occupational History   Occupation: works from home.  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 In 06/2011.     Separated in 2017, in process of getting divorce.   Lives with her Schnoodle Fredric Mare) puppy 11/2017.   Daughter lives in [place].    Works for [employer] for [employer], doing Astronomer).  Still teaching online course.   Fostering a 66yo girl.      Updated 10/2022   Social Determinants of Health   Financial Resource Strain: Not on file  Food Insecurity: Not on file  Transportation Needs: Not on file  Physical Activity: Not on file  Stress: Not on file  Social Connections: Unknown (05/09/2022)   Received from [facility], [facility]   Social Network    Social Network: Not on file  Intimate Partner Violence: Unknown (04/01/2022)   Received from [facility], [facility]   HITS    Physically Hurt: Not on file    Insult or Talk Down To: Not on file    Threaten Physical Harm: Not on file    Scream or Curse: Not on file    Family History  Problem Relation Age of Onset   Hyperlipidemia Mother    Hypertension Mother    Diabetes Mother     Dementia Mother    Irritable bowel syndrome Mother    Stroke Mother 63   Alcohol abuse Father    Kidney disease Father    Ulcers Father    Cancer Maternal Aunt        breast cancer (60's)   Colon cancer Maternal Uncle 68  Colon polyps Maternal Uncle 73   Cancer Paternal Aunt        ?unsure of type (stomach vs ovarian)   Heart disease Maternal Grandmother    Diabetes Maternal Grandfather    Stroke Maternal Grandfather    Allergies Daughter    Eczema Daughter    Esophageal cancer Neg Hx    Rectal cancer Neg Hx    Stomach cancer Neg Hx      Allergies  Allergen Reactions   Amoxicillin-Pot Clavulanate Diarrhea and Nausea Only      Patient's last menstrual period was No LMP recorded. Patient is postmenopausal..          Sexually active: yes  Exercising: yes   Review of Systems Alls systems reviewed and are negative.     Physical Exam Constitutional:      Appearance: Normal appearance.  Genitourinary:     Vulva and urethral meatus normal.     No lesions in the vagina.     Right Labia: No rash, lesions or skin changes.    Left Labia: No lesions, skin changes or rash.    No vaginal discharge or tenderness.     No vaginal prolapse present.    Mild vaginal atrophy present.     Right Adnexa: not tender, not palpable and no mass present.    Left Adnexa: not tender, not palpable and no mass present.    No cervical motion tenderness or discharge.     Uterus is not enlarged, tender or irregular.     Uterus is anteverted.  Breasts:    Right: Normal.     Left: Normal.  HENT:     Head: Normocephalic.  Neck:     Thyroid: No thyroid mass, thyromegaly or thyroid tenderness.  Cardiovascular:     Rate and Rhythm: Normal rate and regular rhythm.     Heart sounds: Normal heart sounds, S1 normal and S2 normal.  Pulmonary:     Effort: Pulmonary effort is normal.     Breath sounds: Normal breath sounds and air entry.  Abdominal:     General: There is no distension.      Palpations: Abdomen is soft. There is no mass.     Tenderness: There is no abdominal tenderness. There is no guarding or rebound.  Musculoskeletal:        General: Normal range of motion.     Cervical back: Full passive range of motion without pain, normal range of motion and neck supple. No tenderness.     Right lower leg: No edema.     Left lower leg: No edema.  Neurological:     Mental Status: She is alert.  Skin:    General: Skin is warm.  Psychiatric:        Mood and Affect: Mood normal.        Behavior: Behavior normal.        Thought Content: Thought content normal.  Vitals and nursing note reviewed. Exam conducted with a chaperone present.      A:         Well Woman GYN exam. H/o abnormal pap smear                             P:        Pap smear collected today Encouraged annual mammogram screening Colon cancer screening up-to-date DXA ordered today Labs and immunizations to do with PMD Encouraged healthy lifestyle practices Encouraged Vit  D and Calcium   No follow-ups on file.  Earley Favor

## 2023-10-06 NOTE — [place] Notes (Unsigned)
No chief complaint on file.  Saw GYN yesterday, no rectal exam documented.  Had colonoscopy 07/29/23 with Dr. Russella Dar: Impression: - Mild diverticulosis in the left colon. - The examination was otherwise normal on direct and retroflexion views. - No specimens collected. Recommendation: - Repeat colonoscopy in 5 years for surveillance.  We discussed rectal bleeding in10/2020. She had an old compounded cream which was effective.  Had some hard stools, with BRB on the toilet paper.  Pain with passing the hard stool.  She tried Preparation H without benefit. She was given 2% diltiazem gel to use prn. There is a report of internal hemorrhoids noted in her chart from 2017.  Last discussed at her CPE 10/2022-- H/o anal fissures causing rectal bleeding. Drinking more water has helped prevent flares.   Has used diltiazem gel in the past, never filled the last prescription. She uses OTC meds prn (preparation H), which works well.  Flares every 2 months, when constipated.    PMH, PSH, SH reviewed   ROS:    PHYSICAL EXAM:  There were no vitals taken for this visit.      ASSESSMENT/PLAN:

## 2023-10-07 ENCOUNTER — Ambulatory Visit (HOSPITAL_COMMUNITY)
Admission: RE | Admit: 2023-10-07 | Discharge: 2023-10-07 | Disposition: A | Discharge status: Home / Self Care | Payer: BC Managed Care – PPO | Source: Ambulatory Visit | Attending: Obstetrics and Gynecology | Admitting: Obstetrics and Gynecology

## 2023-10-07 ENCOUNTER — Ambulatory Visit: Payer: BC Managed Care – PPO | Admitting: Family Medicine

## 2023-10-07 ENCOUNTER — Encounter: Payer: Self-pay | Admitting: Family Medicine

## 2023-10-07 VITALS — BP 128/76 | HR 72 | Ht 66.0 in | Wt 164.0 lb

## 2023-10-07 DIAGNOSIS — Z23 Encounter for immunization: Secondary | ICD-10-CM | POA: Diagnosis not present

## 2023-10-07 DIAGNOSIS — N83201 Unspecified ovarian cyst, right side: Secondary | ICD-10-CM | POA: Insufficient documentation

## 2023-10-07 DIAGNOSIS — D259 Leiomyoma of uterus, unspecified: Secondary | ICD-10-CM | POA: Diagnosis not present

## 2023-10-07 DIAGNOSIS — N858 Other specified noninflammatory disorders of uterus: Secondary | ICD-10-CM | POA: Diagnosis not present

## 2023-10-07 DIAGNOSIS — K644 Residual hemorrhoidal skin tags: Secondary | ICD-10-CM | POA: Diagnosis not present

## 2023-10-07 MED ORDER — HYDROCORTISONE (PERIANAL) 2.5 % EX CREA: TOPICAL_CREAM | CUTANEOUS | 1 refills | Status: AC

## 2023-10-09 LAB — CYTOLOGY - PAP
Comment: NEGATIVE
Diagnosis: NEGATIVE
High risk HPV: NEGATIVE

## 2023-10-12 ENCOUNTER — Encounter: Payer: Self-pay | Admitting: Obstetrics and Gynecology

## 2023-10-26 DIAGNOSIS — R92313 Mammographic fatty tissue density, bilateral breasts: Secondary | ICD-10-CM | POA: Diagnosis not present

## 2023-10-26 DIAGNOSIS — Z1231 Encounter for screening mammogram for malignant neoplasm of breast: Secondary | ICD-10-CM | POA: Diagnosis not present

## 2023-10-26 LAB — HM MAMMOGRAPHY

## 2023-10-29 ENCOUNTER — Ambulatory Visit: Payer: BC Managed Care – PPO | Admitting: Obstetrics and Gynecology

## 2023-10-29 ENCOUNTER — Encounter: Payer: Self-pay | Admitting: Obstetrics and Gynecology

## 2023-10-29 VITALS — BP 130/82 | HR 70

## 2023-10-29 DIAGNOSIS — Z712 Person consulting for explanation of examination or test findings: Secondary | ICD-10-CM | POA: Diagnosis not present

## 2023-10-29 DIAGNOSIS — D259 Leiomyoma of uterus, unspecified: Secondary | ICD-10-CM

## 2023-10-29 NOTE — Progress Notes (Signed)
67 y.o. y.o. female here for [place] results She denies any PM bleeding.  Has known fibroids. Patient to schedule dxa scan  No LMP recorded. Patient is postmenopausal.   US PELVIC COMPLETE WITH TRANSVAGINAL (Accession [id number]) (Order [id number]) Imaging Date: 10/07/2023 Department: [facility] Released By: Zada Finders Authorizing: Earley Favor, MD   Exam Status  Status  Final [99]   PACS Intelerad Image Link   Show images for US PELVIC COMPLETE WITH TRANSVAGINAL Study Result  Narrative & Impression  CLINICAL DATA:  Uterine fibroids   EXAM: TRANSABDOMINAL AND TRANSVAGINAL ULTRASOUND OF PELVIS   TECHNIQUE: Both transabdominal and transvaginal ultrasound examinations of the pelvis were performed. Transabdominal technique was performed for global imaging of the pelvis including uterus, ovaries, adnexal regions, and pelvic cul-de-sac. It was necessary to proceed with endovaginal exam following the transabdominal exam to visualize the endometrium.   COMPARISON:  07/17/2022, 05/22/2022   FINDINGS: Uterus   Measurements: 9.0 x 5.6 x 6.6 cm = volume: 175 mL. Heterogeneous myometrium with multiple uterine fibroids some of which contain shadowing calcifications. Largest fibroid at the posterior uterine body measures up to 3.7 cm. Anterior uterine body fibroids measure up to 3.5 cm and 3.1 cm in diameter.   Endometrium   Obscured by adjacent fibroids.   Right ovary   Not visualized.   Left ovary   Not visualized.   Other findings   No abnormal free fluid.   IMPRESSION: 1. Fibroid uterus, similar in appearance to prior ultrasound. 2. Nonvisualization of the endometrium and ovaries.     Electronically Signed   By: Duanne Guess D.O.   On: 10/29/2023 10:17     Pelvic discharge: denies Pelvic pain: denies  Last mammogram: 2022 Last colonoscopy: 7/24 DXA: not done There is no height or weight on file to  calculate BMI.'  Blood pressure 130/82, pulse 70, SpO2 99%.     Component Value Date/Time   DIAGPAP  10/05/2023 0901    - Negative for intraepithelial lesion or malignancy (NILM)   DIAGPAP - Low grade squamous intraepithelial lesion (LSIL) (A) 01/26/2023 1142   HPVHIGH Negative 10/05/2023 0901   HPVHIGH Positive (A) 04/02/2023 1612   ADEQPAP  10/05/2023 0901    Satisfactory for evaluation; transformation zone component PRESENT.   ADEQPAP  01/26/2023 1142    Satisfactory for evaluation; transformation zone component PRESENT.   4/24 colpo:  Y SURGICAL PATHOLOGY CASE: [id number] PATIENT: Laura Kirby Surgical Pathology Report     Clinical History: LGSIL (cm)     FINAL MICROSCOPIC DIAGNOSIS:  A. CERVIX, 11 O'CLOCK, BIOPSY: Low-grade squamous intraepithelial lesion, CIN-1.   GYN HISTORY:    Component Value Date/Time   DIAGPAP  10/05/2023 0901    - Negative for intraepithelial lesion or malignancy (NILM)   DIAGPAP - Low grade squamous intraepithelial lesion (LSIL) (A) 01/26/2023 1142   HPVHIGH Negative 10/05/2023 0901   HPVHIGH Positive (A) 04/02/2023 1612   ADEQPAP  10/05/2023 0901    Satisfactory for evaluation; transformation zone component PRESENT.   ADEQPAP  01/26/2023 1142    Satisfactory for evaluation; transformation zone component PRESENT.   New partner since last pap smear  OB History  Gravida Para Term Preterm AB Living  0 0 0 0 0 0  SAB IAB Ectopic Multiple Live Births  0 0 0 0      Past Medical History:  Diagnosis Date   Abnormal Pap smear of cervix    Anemia    hx of  Anxiety state, unspecified    on daily xanax for a couple of years in past   Arthritis    L AC joint/shoulder   Cataract    not a surgical candidate at this time (07/06/2023)   Depression    Fibroid uterus    GYN--Dr. 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     Biotin 5000 MCG CAPS Take 1 capsule by mouth daily.     Calcium Carbonate-Vit D-Min (CALCIUM 1200 PO) Take 1 each by mouth daily.     clobetasol ointment (TEMOVATE) 0.05 %      cyproheptadine (PERIACTIN) 4 MG tablet Take 1 tablet (4 mg total) by mouth at bedtime as needed. for sleep 90 tablet 3   dexlansoprazole (DEXILANT) 60 MG capsule Take 1 capsule (60 mg total) by mouth daily. (Patient taking differently: Take 1 capsule by mouth daily. Takes three times weekly) 30 capsule 11   escitalopram (LEXAPRO) 10 MG tablet Take 1 tablet (10 mg total) by mouth daily. 90 tablet 3   folic acid (FOLVITE) 400 MCG tablet Take 400 mcg by mouth daily.     glucosamine-chondroitin 500-400 MG tablet Take 1 tablet by mouth daily.     hydrocortisone (ANUSOL-HC) 2.5 % rectal cream Use the cream topically  three times daily as needed for hemorrhoid flare 30 g 1   loratadine (CLARITIN) 10 MG tablet Take 10 mg by mouth daily as needed for allergies.     Magnesium 500 MG TABS Take 1 tablet by mouth daily.     minoxidil (LONITEN) 2.5 MG  tablet Take by mouth.     Probiotic Product (RESTORA) CAPS Take 1 capsule by mouth daily.     Turmeric 500 MG TABS Take 500 mg by mouth.     vitamin D, CHOLECALCIFEROL, 400 UNITS tablet Take 400 Units by mouth daily.     zinc gluconate 50 MG tablet Take 50 mg by mouth daily.     No current facility-administered medications on file prior to visit.    Social History   Socioeconomic History   Marital status: Divorced    Spouse name: Not on file   Number of children: 1   Years of education: Not on file   Highest education level: Not on file  Occupational History   Occupation: works from home.  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 In 06/2011.     Separated in 2017, in process of getting divorce.   Lives with her Schnoodle Fredric Mare) puppy 11/2017.   Daughter lives in [place].    Works for [employer] for [employer], doing Astronomer).  Still teaching online course.   Fostering a 66yo girl.      Updated 10/2022   Social Determinants of Health   Financial Resource Strain: Not on file  Food Insecurity: Not on file  Transportation Needs: Not on file  Physical Activity: Not on file  Stress: Not on file  Social Connections: Unknown (05/09/2022)   Received [facility]   Social Network    Social Network: Not on file  Intimate Partner Violence: Unknown (04/01/2022)   Received from [facility], [facility]   HITS    Physically Hurt: Not on file    Insult or Talk Down To:  Not on file    Threaten Physical Harm: Not on file    Scream or Curse: Not on file    Family History  Problem Relation Age of Onset   Hyperlipidemia Mother    Hypertension Mother    Diabetes Mother    Dementia Mother    Irritable bowel syndrome Mother    Stroke Mother 99   Alcohol abuse Father    Kidney disease Father    Ulcers Father    Cancer Maternal Aunt        breast cancer (60's)   Colon cancer Maternal Uncle 69   Colon polyps Maternal Uncle 47   Cancer Paternal Aunt        ?unsure of type (stomach vs ovarian)   Heart disease Maternal Grandmother    Diabetes Maternal Grandfather    Stroke Maternal Grandfather    Allergies Daughter    Eczema Daughter    Esophageal cancer Neg Hx    Rectal cancer Neg Hx    Stomach cancer Neg Hx      Allergies  Allergen Reactions   Amoxicillin-Pot Clavulanate Diarrhea and Nausea Only      Patient's last menstrual period was No LMP recorded. Patient is postmenopausal..          Sexually active: yes  Exercising: yes   Review of Systems Alls systems reviewed and are negative.         A:        Here to discuss results.H/o abnormal pap smear                             P:       [place] printed and reviewed with patient.  Stable appearing fibroids. She is not bothered by them.  To continue conservative care.  Counseled to return with any PM bleeding in the future and she agreed.  Pap smear normal HPV Neg   No follow-ups on file. 15 minutes spent on reviewing records, imaging,  and one on one patient time and counseling patient and  documentation Dr. Arman Filter Tresa Res

## 2023-11-03 ENCOUNTER — Encounter: Payer: Self-pay | Admitting: Family Medicine

## 2023-11-04 ENCOUNTER — Encounter: Payer: Self-pay | Admitting: *Deleted

## 2023-11-06 ENCOUNTER — Other Ambulatory Visit (INDEPENDENT_AMBULATORY_CARE_PROVIDER_SITE_OTHER): Payer: BC Managed Care – PPO

## 2023-11-06 DIAGNOSIS — Z23 Encounter for immunization: Secondary | ICD-10-CM

## 2023-11-09 ENCOUNTER — Encounter: Payer: Self-pay | Admitting: Family Medicine

## 2023-11-20 ENCOUNTER — Encounter: Payer: Self-pay | Admitting: *Deleted

## 2023-12-02 ENCOUNTER — Encounter: Payer: Self-pay | Admitting: Family Medicine

## 2023-12-02 ENCOUNTER — Ambulatory Visit: Payer: BC Managed Care – PPO | Admitting: Family Medicine

## 2023-12-02 VITALS — BP 138/88 | HR 88 | Temp 99.9°F | Ht 66.0 in | Wt 163.4 lb

## 2023-12-02 DIAGNOSIS — R0981 Nasal congestion: Secondary | ICD-10-CM | POA: Diagnosis not present

## 2023-12-02 DIAGNOSIS — R051 Acute cough: Secondary | ICD-10-CM

## 2023-12-02 DIAGNOSIS — J069 Acute upper respiratory infection, unspecified: Secondary | ICD-10-CM | POA: Diagnosis not present

## 2023-12-02 DIAGNOSIS — R509 Fever, unspecified: Secondary | ICD-10-CM | POA: Diagnosis not present

## 2023-12-02 LAB — POCT INFLUENZA A/B
Influenza A, POC: NEGATIVE
Influenza B, POC: NEGATIVE

## 2023-12-02 LAB — POC COVID19 BINAXNOW: SARS Coronavirus 2 Ag: NEGATIVE

## 2023-12-02 NOTE — Progress Notes (Signed)
Chief Complaint  Patient presents with   Cough    Cough, nasal congestion and ears feel full and she can feel her heart pulsating in them. Was starting to feel sick Thanksgiving day and then left for cruise out of [place] last weekend.    11/27 she started with cough, body aches, congestion. No known fever or chills. Cough has gotten a little worse over the past week. She was on a cruise, used a lot of OTC meds while on the cruise. Stopped taking the meds when she got home, thinks that is why she is feeling a little worse.  Returned 2 nights ago--had some worsening cough, intermittent laryngitis. No sinus pain. She has some pain in the chest and back, hurts with coughing.  Nasal drainage is a mixture of clear and yellow. Phlegm is very thick, sometimes is yellow, green, bloody. Mainly get this in the morning only, unable to get up phlegm later in the day.  She has some pounding in her ears, pulsating, makes hearing worse. She had some ear pain with flight out, not on return flights.  While away she took theraflu nighttime and daytime, and airborne gummies. Only has taking airborne gummies since home. She took a dose of mucinex cough yesterday, before bedtime.  As soon as she lays down, she coughs more.   PMH, PSH, SH reviewed  Outpatient Encounter Medications as of 12/02/2023  Medication Sig Note   Apoaequorin (PREVAGEN PO) Take 1 capsule by mouth daily.    Biotin 5000 MCG CAPS Take 1 capsule by mouth daily.    Calcium Carbonate-Vit D-Min (CALCIUM 1200 PO) Take 1 each by mouth daily.    cyproheptadine (PERIACTIN) 4 MG tablet Take 1 tablet (4 mg total) by mouth at bedtime as needed. for sleep    escitalopram (LEXAPRO) 10 MG tablet Take 1 tablet (10 mg total) by mouth daily.    folic acid (FOLVITE) 400 MCG tablet Take 400 mcg by mouth daily.    glucosamine-chondroitin 500-400 MG tablet Take 1 tablet by mouth daily.    Magnesium 500 MG TABS Take 1 tablet by mouth daily.    minoxidil  (LONITEN) 2.5 MG tablet Take by mouth.    Multiple Vitamins-Minerals (AIRBORNE GUMMIES PO) Take 2 each by mouth as needed.    Probiotic Product (RESTORA) CAPS Take 1 capsule by mouth daily.    Turmeric 500 MG TABS Take 500 mg by mouth.    vitamin D, CHOLECALCIFEROL, 400 UNITS tablet Take 400 Units by mouth daily.    zinc gluconate 50 MG tablet Take 50 mg by mouth daily.    clobetasol ointment (TEMOVATE) 0.05 %  (Patient not taking: Reported on 12/02/2023) 12/02/2023: As needed   dexlansoprazole (DEXILANT) 60 MG capsule Take 1 capsule (60 mg total) by mouth daily. (Patient not taking: Reported on 12/02/2023) 12/02/2023: Three times weekly    hydrocortisone (ANUSOL-HC) 2.5 % rectal cream Use the cream topically  three times daily as needed for hemorrhoid flare (Patient not taking: Reported on 12/02/2023)    loratadine (CLARITIN) 10 MG tablet Take 10 mg by mouth daily as needed for allergies. (Patient not taking: Reported on 12/02/2023) 10/07/2023: Seasonally in Spring    No facility-administered encounter medications on file as of 12/02/2023.    ROS: URI symptoms per HPI. No chest pain. No n/v/d, heartburn. No bleeding, bruising, rash   PHYSICAL EXAM:  BP 138/88   Pulse 88   Temp 99.9 F (37.7 C) (Tympanic)   Ht 5\' 6"  (1.676 m)  Wt 163 lb 6.4 oz (74.1 kg)   BMI 26.37 kg/m    Frequent throat-clearing, spells of cough. Relieved by drinking water. Wet-sounding cough. Sounds congested HEENT: conjunctiva and sclera are clear, EOMI. TM's and EAC's normal bilaterally. Mod-severe edema of nasal mucosa, clear mucus. OP clear with some clear mucus noted (pulling the uvula to the side--no erythema or inflammation). Sinuses nontender Neck: no lymphadenopathy or mass Heart: regular rate and rhythm Lungs: clear bilaterally Back: no CVA tenderness Extremities: no edema Skin: normal turgor, no rash   COVID negative Influenza A&B negative  ASSESSMENT/PLAN:  Viral upper respiratory infection  - supportive measures reviewed. To contact us if sx persist/worsen, for ABX  Acute cough - Plan: POC COVID-19, Influenza A/B  Nasal congestion - Plan: POC COVID-19, Influenza A/B  Low grade fever - Plan: POC COVID-19, Influenza A/B   Declines hycodan--discussed, due to constipation risk.   Be sure to drink plenty of water. Switch to Mucinex DM 12 hour tablets, taking it twice daily per directions on box. Your cough is mainly related to postnasal drainage. You have a lot of head congestion, which is contributing to your ear symptoms. You can use a decongestant such as sudafed (I recommend the pseudoephedrine behind the counter at the pharmacy).  I would get the short-acting one, or a 12 hour one that you take just in the morning. Some people are sensitive to the decongestant, contributing to insomnia at night. Don't use the theraflu products along with these medications.  Elevate the head of the bed. Try doing sinus rinses prior to bedtime (and possibly in the morning as well). (Sinus rinse kit--NeillMed, or Neti-pot; use distilled or boiled water to make the saline solution, not tap water).  If you notice that the mucus or phlegm is getting more discolored, rather than lighter or less, if you develop persistent fever, or worsening cough (usually getting worse after 7-10 days of symptoms), that's when we would add an antibiotic.

## 2023-12-02 NOTE — Patient Instructions (Signed)
  Be sure to drink plenty of water. Switch to Mucinex DM 12 hour tablets, taking it twice daily per directions on box. Your cough is mainly related to postnasal drainage. You have a lot of head congestion, which is contributing to your ear symptoms. You can use a decongestant such as sudafed (I recommend the pseudoephedrine behind the counter at the pharmacy).  I would get the short-acting one, or a 12 hour one that you take just in the morning. Some people are sensitive to the decongestant, contributing to insomnia at night. Don't use the theraflu products along with these medications.  Elevate the head of the bed. Try doing sinus rinses prior to bedtime (and possibly in the morning as well). (Sinus rinse kit--NeillMed, or Neti-pot; use distilled or boiled water to make the saline solution, not tap water).  If you notice that the mucus or phlegm is getting more discolored, rather than lighter or less, if you develop persistent fever, or worsening cough (usually getting worse after 7-10 days of symptoms), that's when we would add an antibiotic.

## 2023-12-03 ENCOUNTER — Ambulatory Visit: Payer: BC Managed Care – PPO | Admitting: Family Medicine

## 2023-12-06 ENCOUNTER — Encounter: Payer: Self-pay | Admitting: Family Medicine

## 2023-12-07 MED ORDER — AZITHROMYCIN 250 MG PO TABS: ORAL_TABLET | ORAL | 0 refills | Status: DC

## 2023-12-13 NOTE — Progress Notes (Unsigned)
No chief complaint on file.  Laura Kirby is a 67 y.o. female who presents for a complete physical. She is under the care of GYN, last seen 09/2023.  Pelvic US showed stable fibroids.  She was recently seen with upper respiratory illness, requiring a z-pak after not improving.   Hyperlipidemia follow-up:  She tries to follow a low-fat, low cholesterol diet. Her cholesterol has been controlled by diet in the past. LDL was up to 172 three years ago, has improved over the last couple of years when cutting back on cheese.  She is due for recheck. She had calcium score of 0 in 11/2021. No incidental findings on overread.  Diet--she is still vegetarian, still limiting her cheese some.   Last year she reported eating more fast food (fries, [place] food, pancakes), due to having foster child. Eggs Cheese  ***UPDATE DIET  Component Ref Range & Units (hover) 1 yr ago 2 yr ago 3 yr ago 4 yr ago 5 yr ago 6 yr ago 7 yr ago  Cholesterol, Total 217 High  228 High  252 High  231 High  220 High     Triglycerides 96 101 104 111 107 88 R 101 R  HDL 61 53 62 60 56 59 R 50 R  VLDL Cholesterol Cal 17 18 18 20 21     LDL Chol Calc (NIH) 139 High  157 High  172 High  151 High      Chol/HDL Ratio 3.6 4.3 CM 4.1 CM 3.9 CM 3.9 CM 3.6 R 4.2 R    Depression/anxiety: This has been controlled on lexapro, denies side effects. She has not needed to use xanax in years.   It has been stressful with her foster child, but overall moods have been okay.    GERD: She used to take Dexilant daily, but due to cost, tapered back to three times/week, and this remains effective.  Denies dysphagia.    Headaches: Continues to only get headaches infrequently, related to stress, and sometimes seasons. Previously relieved by Vanquish as needed, but no longer available. She uses Tylenol prn with good results, only needing a few times/year.  She continues to do well in this regard. No increase in headaches with stress of  fostering.   Insomnia: Takes periactin nightly to help with sleep. She has some seasonal allergies, and uses claritin as needed, not recently (mainly in the Spring).  H/o anal fissures and hemorrhoids.  Seen with flare of external hemorrhoids in 09/2023, when she had been out of her stool softeners and Restora, had been straining. Anusol-HC was prescribed. Has used diltiazem gel in the past for fissures, not in a long time.  Immunization History  Administered Date(s) Administered   PFIZER Comirnaty(Gray Top)Covid-19 Tri-Sucrose Vaccine 07/16/2021   PFIZER(Purple Top)SARS-COV-2 Vaccination 03/24/2020, 04/14/2020, 11/01/2020   PNEUMOCOCCAL CONJUGATE-20 11/06/2023   Pfizer Covid-19 Vaccine Bivalent Booster 82yrs & up 11/11/2021   Pfizer(Comirnaty)Fall Seasonal Vaccine 12 years and older 11/19/2022, 10/07/2023   Respiratory Syncytial Virus Vaccine,Recomb Aduvanted(Arexvy) 12/27/2022   Td 11/17/2005   Tdap 10/18/2011   Zoster Recombinant(Shingrix) 09/29/2017, 12/02/2017   Refuses flu shots Last Pap smear:  09/2023 normal, negative HR HPV (prior +HPV 11/2020) Last mammogram: 09/2023 Last colonoscopy:  07/29/23 with Dr. Russella Dar, mild diverticulosis in the left colon. 5 year f/u recommended (prior tubular adenoma 06/2018) Last DEXA: never; ordered placed by GYN 09/2023, not yet scheduled. Dentist: twice a year Ophtho: yearly Exercise:   No regular exercise since September (when got foster child), just  recently started back with 15 minutes daily (squats, situps). Hasn't been on the exercise bike. No weight-bearing exercise.  Vitamin D level was normal at 48 in 2012. She denies changes in vitamins  Other doctors: GI: Dr. Russella Dar GYN: Dr. Seymour Bars Ophtho: Dr. Lorin Picket Dentist: Forde Dandy Dental Derm: Dr. Darreld Mclean Plastic surgery (possibly going to get Juvederm)--[facility]eler ENT: Dr. Doran Heater (hearing loss)   PMH, PSH, SH and FH were reviewed and updated.     ROS: The patient denies fever,  vision changes, weight changes, sore throat, breast concerns, chest pain, palpitations, dizziness, syncope, dyspnea on exertion, cough, swelling, nausea, vomiting, diarrhea, constipation, abdominal pain, melena, hematochezia, indigestion/heartburn.  Denies numbness, tingling, weakness, tremor, suspicious skin lesions.    L hearing loss--no hearing aid yet. Moods are good, insomnia controlled with medication.  Intermittent constipation and fissures URI symptoms    PHYSICAL EXAM:  There were no vitals taken for this visit.  Wt Readings from Last 3 Encounters:  12/02/23 163 lb 6.4 oz (74.1 kg)  10/07/23 164 lb (74.4 kg)  10/05/23 160 lb (72.6 kg)    General Appearance:    Alert, cooperative, no distress, appears stated age     Head:    Normocephalic, without obvious abnormality, atraumatic     Eyes:    PERRL, conjunctiva/corneas clear, EOM's intact, fundi benign     Ears:    Normal TM's and external ear canals  Nose:    No drainage or sinus tenderness  Throat:    Normal mucosa  Neck:    Supple, no lymphadenopathy; thyroid: no enlargement/ tenderness/nodules; no carotid bruit or JVD     Back:    Spine nontender, no curvature or CVA tenderness    Lungs:    Clear to auscultation bilaterally without wheezes, rales or ronchi; respirations unlabored     Chest Wall:    No tenderness or deformity     Heart:    Regular rate and rhythm, S1 and S2 normal, no murmur, rub or gallop     Breast Exam:    Deferred to GYN     Abdomen:    Soft, nondistended, nontender, normoactive bowel sounds, no masses, no hepatosplenomegaly.  Genitalia:    Deferred to GYN     Rectal:    Deferred to GYN  Extremities:    No clubbing, cyanosis or edema. 2+ PT and DP pulses.  Pulses:    2+ and symmetric all extremities     Skin:    Skin color, texture, turgor normal, no rashes or lesions.   Lymph nodes:    Cervical, supraclavicular nodes normal     Neurologic:    Normal strength, sensation and gait; reflexes 2+ and  symmetric throughout                          Psych:  Normal mood, affect, hygiene and grooming      11/19/2022    9:44 AM 11/11/2021    9:56 AM 06/05/2021    2:05 PM 06/05/2021    1:48 PM 11/01/2020   10:34 AM  Depression screen PHQ 2/9  Decreased Interest 0 0 0 0 0  Down, Depressed, Hopeless 0 0 0 0 1  PHQ - 2 Score 0 0 0 0 1  Altered sleeping 0 0 0  0  Tired, decreased energy 0 0 0  0  Change in appetite 0 0 0  0  Feeling bad or failure about yourself  0 0 0  1  Trouble concentrating 0 1 0  0  Moving slowly or fidgety/restless 0 0 0  0  Suicidal thoughts 0 0 0  0  PHQ-9 Score 0 1 0  2  Difficult doing work/chores Not difficult at all Not difficult at all        ASSESSMENT/PLAN:  Phq-9 Tdap--ensure still BCBS commercial, and not Medicare (otherwise to get from pharmacy)  TSH if sx (last checked 2018)  Living Will forms? (Given last year)  Discussed monthly self breast exams and yearly mammograms; at least 30 minutes of aerobic activity at least 5 days/week, weight-bearing exercise at least 2x/week; proper sunscreen use reviewed; healthy diet, including goals of calcium and vitamin D intake and alcohol recommendations (less than or equal to 1 drink/day) reviewed; regular seatbelt use; changing batteries in smoke detectors. Immunization recommendations discussed--she declines flu shots, encouraged to get yearly (now high dose, due to age).  TdaP  Colonoscopy recommendations reviewed--UTD, due again in 06/2028.   Living Will and healthcare power of attorney was discussed, forms given last year.  Encouraged her to get [place] copies of completed forms.  F/u 1 year, sooner prn

## 2023-12-13 NOTE — Patient Instructions (Incomplete)

## 2023-12-14 ENCOUNTER — Ambulatory Visit (INDEPENDENT_AMBULATORY_CARE_PROVIDER_SITE_OTHER): Payer: BC Managed Care – PPO | Admitting: Family Medicine

## 2023-12-14 ENCOUNTER — Encounter: Payer: Self-pay | Admitting: Family Medicine

## 2023-12-14 VITALS — BP 120/80 | HR 60 | Ht 66.0 in | Wt 162.0 lb

## 2023-12-14 DIAGNOSIS — Z23 Encounter for immunization: Secondary | ICD-10-CM

## 2023-12-14 DIAGNOSIS — G4700 Insomnia, unspecified: Secondary | ICD-10-CM

## 2023-12-14 DIAGNOSIS — K219 Gastro-esophageal reflux disease without esophagitis: Secondary | ICD-10-CM | POA: Diagnosis not present

## 2023-12-14 DIAGNOSIS — F325 Major depressive disorder, single episode, in full remission: Secondary | ICD-10-CM

## 2023-12-14 DIAGNOSIS — F411 Generalized anxiety disorder: Secondary | ICD-10-CM

## 2023-12-14 DIAGNOSIS — E7800 Pure hypercholesterolemia, unspecified: Secondary | ICD-10-CM | POA: Diagnosis not present

## 2023-12-14 DIAGNOSIS — Z0000 Encounter for general adult medical examination without abnormal findings: Secondary | ICD-10-CM

## 2023-12-14 DIAGNOSIS — R1031 Right lower quadrant pain: Secondary | ICD-10-CM

## 2023-12-14 MED ORDER — ESCITALOPRAM OXALATE 10 MG PO TABS: 10.0000 mg | ORAL_TABLET | Freq: Every day | ORAL | 3 refills | Status: DC

## 2023-12-14 MED ORDER — CYPROHEPTADINE HCL 4 MG PO TABS: 4.0000 mg | ORAL_TABLET | Freq: Every evening | ORAL | 3 refills | Status: DC | PRN

## 2023-12-15 LAB — CMP14+EGFR
ALT: 18 [IU]/L (ref 0–32)
AST: 17 [IU]/L (ref 0–40)
Albumin: 4.3 g/dL (ref 3.9–4.9)
Alkaline Phosphatase: 63 [IU]/L (ref 44–121)
BUN/Creatinine Ratio: 16 (ref 12–28)
BUN: 17 mg/dL (ref 8–27)
Bilirubin Total: 0.5 mg/dL (ref 0.0–1.2)
CO2: 25 mmol/L (ref 20–29)
Calcium: 9.6 mg/dL (ref 8.7–10.3)
Chloride: 108 mmol/L — ABNORMAL HIGH (ref 96–106)
Creatinine, Ser: 1.08 mg/dL — ABNORMAL HIGH (ref 0.57–1.00)
Globulin, Total: 2.7 g/dL (ref 1.5–4.5)
Glucose: 86 mg/dL (ref 70–99)
Potassium: 4.8 mmol/L (ref 3.5–5.2)
Sodium: 147 mmol/L — ABNORMAL HIGH (ref 134–144)
Total Protein: 7 g/dL (ref 6.0–8.5)
eGFR: 57 mL/min/{1.73_m2} — ABNORMAL LOW (ref >59)

## 2023-12-15 LAB — LIPID PANEL
Chol/HDL Ratio: 3.8 {ratio} (ref 0.0–4.4)
Cholesterol, Total: 195 mg/dL (ref 100–199)
HDL: 51 mg/dL (ref >39)
LDL Chol Calc (NIH): 129 mg/dL — ABNORMAL HIGH (ref 0–99)
Triglycerides: 82 mg/dL (ref 0–149)
VLDL Cholesterol Cal: 15 mg/dL (ref 5–40)

## 2023-12-17 ENCOUNTER — Encounter: Payer: Self-pay | Admitting: Obstetrics and Gynecology

## 2023-12-17 DIAGNOSIS — Z01419 Encounter for gynecological examination (general) (routine) without abnormal findings: Secondary | ICD-10-CM

## 2023-12-17 DIAGNOSIS — E2839 Other primary ovarian failure: Secondary | ICD-10-CM

## 2024-01-07 ENCOUNTER — Ambulatory Visit (HOSPITAL_BASED_OUTPATIENT_CLINIC_OR_DEPARTMENT_OTHER)
Admission: RE | Admit: 2024-01-07 | Discharge: 2024-01-07 | Disposition: A | Discharge status: Home / Self Care | Payer: BC Managed Care – PPO | Source: Ambulatory Visit | Attending: Obstetrics and Gynecology | Admitting: Obstetrics and Gynecology

## 2024-01-07 ENCOUNTER — Encounter: Payer: Self-pay | Admitting: Obstetrics and Gynecology

## 2024-01-07 DIAGNOSIS — Z780 Asymptomatic menopausal state: Secondary | ICD-10-CM | POA: Diagnosis not present

## 2024-01-07 DIAGNOSIS — E2839 Other primary ovarian failure: Secondary | ICD-10-CM | POA: Insufficient documentation

## 2024-01-07 DIAGNOSIS — Z01419 Encounter for gynecological examination (general) (routine) without abnormal findings: Secondary | ICD-10-CM | POA: Diagnosis not present

## 2024-01-07 DIAGNOSIS — M85852 Other specified disorders of bone density and structure, left thigh: Secondary | ICD-10-CM | POA: Diagnosis not present

## 2024-01-07 DIAGNOSIS — M85851 Other specified disorders of bone density and structure, right thigh: Secondary | ICD-10-CM | POA: Diagnosis not present

## 2024-01-12 ENCOUNTER — Other Ambulatory Visit: Payer: Self-pay | Admitting: Family Medicine

## 2024-01-12 DIAGNOSIS — K219 Gastro-esophageal reflux disease without esophagitis: Secondary | ICD-10-CM

## 2024-01-12 NOTE — Telephone Encounter (Signed)
 Is this okay to refill?

## 2024-02-03 NOTE — Progress Notes (Signed)
 Chief Complaint  Patient presents with   Pain    Pain under right breast/rib cage x 3 weeks. Aleve  and heat help. Reaching and stretching are painful.    Soreness below R breast, at ribs, x 3 weeks. Hurts with reaching and stretching. She remembers feeling like something popped when reaching to clean bathtub 3 weeks ago.  She started taking aleve  just 2 days ago--only took 1 pill yesterday and 1 the day before (none today). This helped. She is also using heat, which might have helped some.  No pain related to eating.  No pain with breathing. No overlying rash    PMH, PSH, SH reviewed  Outpatient Encounter Medications as of 02/04/2024  Medication Sig Note   Apoaequorin (PREVAGEN PO) Take 1 capsule by mouth daily.    Biotin 5000 MCG CAPS Take 1 capsule by mouth daily.    Calcium Carbonate-Vit D-Min (CALCIUM 1200 PO) Take 1 each by mouth daily.    cyproheptadine  (PERIACTIN ) 4 MG tablet Take 1 tablet (4 mg total) by mouth at bedtime as needed. for sleep    dexlansoprazole  (DEXILANT ) 60 MG capsule TAKE 1  BY MOUTH ONCE DAILY    escitalopram  (LEXAPRO ) 10 MG tablet Take 1 tablet (10 mg total) by mouth daily.    folic acid (FOLVITE) 400 MCG tablet Take 400 mcg by mouth daily.    glucosamine-chondroitin 500-400 MG tablet Take 1 tablet by mouth daily.    Magnesium  500 MG TABS Take 1 tablet by mouth daily.    minoxidil (LONITEN) 2.5 MG tablet Take by mouth.    Multiple Vitamins-Minerals (AIRBORNE GUMMIES PO) Take 2 each by mouth as needed.    naproxen  sodium (ALEVE ) 220 MG tablet Take 220 mg by mouth. 02/04/2024: Last dose yesterday 5:45am   Probiotic Product (RESTORA) CAPS Take 1 capsule by mouth daily.    Turmeric 500 MG TABS Take 500 mg by mouth.    vitamin D , CHOLECALCIFEROL, 400 UNITS tablet Take 400 Units by mouth daily.    zinc gluconate 50 MG tablet Take 50 mg by mouth daily.    clobetasol ointment (TEMOVATE) 0.05 %  (Patient not taking: Reported on 12/02/2023) 02/04/2024: As needed    hydrocortisone  (ANUSOL -HC) 2.5 % rectal cream Use the cream topically  three times daily as needed for hemorrhoid flare (Patient not taking: Reported on 12/02/2023)    loratadine (CLARITIN) 10 MG tablet Take 10 mg by mouth daily as needed for allergies. (Patient not taking: Reported on 02/04/2024) 02/04/2024: Seasonally in spring     No facility-administered encounter medications on file as of 02/04/2024.   Allergies  Allergen Reactions   Amoxicillin-Pot Clavulanate Diarrhea and Nausea Only    ROS: no fever/chills, URI symptoms, chest pain, shortness of breath. No nausea ,vomiting, abdominal pain, no bowel changes. No rash. See HPI   PHYSICAL EXAM:  BP 130/78   Pulse 68   Ht 5' 6 (1.676 m)   Wt 168 lb (76.2 kg)   BMI 27.12 kg/m   Wt Readings from Last 3 Encounters:  02/04/24 168 lb (76.2 kg)  12/14/23 162 lb (73.5 kg)  12/02/23 163 lb 6.4 oz (74.1 kg)   Well-appearing, pleasant female, in no distress.  Neck: no lymphadenopathy or mass Heart: regular rate and rhythm Lungs: clear bilaterally Abdomen: soft, nontender, no organomegaly or mass No hepatosplenomegaly or Murphy sign. Chest wall:  tender right inferior floating ribs, at the medial margins (over cartilage). Nontender elsewhere. No pain with sitting up (abdominal contraction) Tender to direct  palptation over the R inferior floating ribs   ASSESSMENT/PLAN:  Painful rib  Take 1-2 Aleve  twice daily with food (start with 2, cut back if any side effects). Take it until the pain has completely resolved (max of 10 days). If your pain isn't improving, or worsening, we can consider checking an x-ray. I suspect the pop you heard was related to the rib and the cartilage. Seeing a chriopractor probably would be the next step, if still very tender to touch over the lower ribs, as it was today.  Continue moist heat as needed as well.   Lab Results  Component Value Date   CREATININE 1.08 (H) 12/14/2023  eGFR 57

## 2024-02-04 ENCOUNTER — Ambulatory Visit: Payer: BC Managed Care – PPO | Admitting: Family Medicine

## 2024-02-04 ENCOUNTER — Encounter: Payer: Self-pay | Admitting: Family Medicine

## 2024-02-04 VITALS — BP 130/78 | HR 68 | Ht 66.0 in | Wt 168.0 lb

## 2024-02-04 DIAGNOSIS — R0781 Pleurodynia: Secondary | ICD-10-CM

## 2024-02-04 NOTE — Patient Instructions (Signed)
 Take 1-2 Aleve  twice daily with food (start with 2, cut back if any side effects). Take it until the pain has completely resolved (max of 10 days). If your pain isn't improving, or worsening, we can consider checking an x-ray. I suspect the pop you heard was related to the rib and the cartilage. Seeing a chriopractor probably would be the next step, if still very tender to touch over the lower ribs, as it was today.  Continue moist heat as needed as well.

## 2024-03-21 ENCOUNTER — Other Ambulatory Visit (HOSPITAL_COMMUNITY): Payer: Self-pay

## 2024-03-21 ENCOUNTER — Telehealth: Payer: Self-pay

## 2024-03-21 ENCOUNTER — Telehealth: Payer: Self-pay | Admitting: Family Medicine

## 2024-03-21 NOTE — Telephone Encounter (Signed)
 Hello,            I have been in communication with the pts plan to Attempt a P/A. I am currrently awaiting a faxed form since this Prior Authorization can't be done via CMM. I will update you with a determination once we receive one.

## 2024-03-21 NOTE — Telephone Encounter (Signed)
 Copied from CRM [id number]. Topic: Clinical - Prescription Issue >> Mar 21, 2024  2:25 PM Turkey B wrote: Reason for CRM: [place] from [employer], states, the med, dexlansoprazole (DEXILANT) 60 MG capsule isnt covered by [employer] anymore, so is looking for alternatives. Please cb  Needs a PA renewal.

## 2024-03-21 NOTE — Telephone Encounter (Signed)
 Hello,            I have been in communication with the pts plan to Attempt a P/A. I am currrently awaiting a faxed form since this Prior Authorization can't be done via CMM. I am getting an error that a P/A isnt required. However it is.

## 2024-03-22 ENCOUNTER — Other Ambulatory Visit (HOSPITAL_COMMUNITY): Payer: Self-pay

## 2024-03-22 ENCOUNTER — Telehealth: Payer: Self-pay

## 2024-03-22 ENCOUNTER — Telehealth: Payer: Self-pay | Admitting: *Deleted

## 2024-03-22 NOTE — Telephone Encounter (Signed)
 Looks like prior Berkley Harvey was requested today, pending. I believe she cut back to using this 3x/week, not taking daily. Not sure what she has previously tried/failed in the past, so check with patient first.  But I think pantoprazole 40 mg daily or omeprazole 40 mg daily #30 would be fine to rx (while waiting for prior auth). Or she can pick up prilosec OTC and take 2 pills together.

## 2024-03-22 NOTE — Telephone Encounter (Signed)
 Copied from CRM [id number]. Topic: Clinical - Prescription Issue >> Mar 21, 2024  2:25 PM Turkey B wrote: Reason for CRM: [place] from [employer], states, the med, dexlansoprazole (DEXILANT) 60 MG capsule isnt covered by [employer] anymore, so is looking for alternatives. Please cb >> Mar 22, 2024  2:26 PM Shelah Lewandowsky wrote: Patient is going out of town tomorrow and is out of medication, would like [place] to call with an update [phone]   Is there something else you would like to call in?

## 2024-03-22 NOTE — Telephone Encounter (Signed)
 Pharmacy Patient Advocate Encounter   Received notification from Physician's Office that prior authorization for Dexlansoprazole is required/requested.   Insurance verification completed.   The patient is insured through [facility] .   Per test claim: PA required; PA submitted to above mentioned insurance via Fax Key/confirmation #/[id number]   Status is pending

## 2024-03-22 NOTE — Telephone Encounter (Signed)
 Spoke with patient and she tried omeprazole in the past and it did not work. She got the pharmacist to give her a few capsules until she returns. Will wait on PA.

## 2024-03-22 NOTE — Telephone Encounter (Signed)
 Copied from CRM [id number]. Topic: Clinical - Prescription Issue >> Mar 22, 2024  2:26 PM Shelah Lewandowsky wrote: Patient is going out of town tomorrow and is out of medication, would like [place] to call with an update [phone]

## 2024-03-24 ENCOUNTER — Other Ambulatory Visit (HOSPITAL_COMMUNITY): Payer: Self-pay

## 2024-03-24 NOTE — Telephone Encounter (Signed)
 Pharmacy Patient Advocate Encounter  Received notification from [facility] that Prior Authorization for  Dexlansoprazole has been APPROVED from 3.25.25 to 3.25.26. Ran test claim, Copay is $100.00 (even eith approval this is a non-formulary Rx. This test claim was processed through [facility]- copay amounts may vary at other pharmacies due to pharmacy/plan contracts, or as the patient moves through the different stages of their insurance plan.

## 2024-03-25 NOTE — Telephone Encounter (Signed)
 Left message with this information for patient, she said she was going out of town yesterday. I asked her to please call and let me know or send MyChart.

## 2024-03-29 ENCOUNTER — Other Ambulatory Visit: Payer: Self-pay | Admitting: Family Medicine

## 2024-03-29 DIAGNOSIS — K219 Gastro-esophageal reflux disease without esophagitis: Secondary | ICD-10-CM

## 2024-03-29 MED ORDER — DEXLANSOPRAZOLE 60 MG PO CPDR: 60.0000 mg | DELAYED_RELEASE_CAPSULE | Freq: Every day | ORAL | 11 refills | Status: DC

## 2024-03-29 NOTE — Telephone Encounter (Signed)
 Copied from CRM [id number]. Topic: Clinical - Medication Refill >> Mar 29, 2024  3:46 PM Priscille Loveless wrote: Most Recent Primary Care Visit:  Provider: Lynelle Doctor, EVE  Department: [facility]  Visit Type: ACUTE  Date: 02/04/2024  Medication: dexlansoprazole (DEXILANT) 60 MG capsule  Has the patient contacted their pharmacy? No  Is this the correct pharmacy for this prescription? Yes If no, delete pharmacy and type the correct one.  This is the patient's preferred pharmacy:   [id number] - [place], [place] - [address] [place] [address] Phone: [phone] Fax: [phone]   Has the prescription been filled recently? Yes  Is the patient out of the medication? Yes  Has the patient been seen for an appointment in the last year OR does the patient have an upcoming appointment? Yes  Can we respond through MyChart? Yes  Agent: Please be advised that Rx refills may take up to 3 business days. We ask that you follow-up with your pharmacy.

## 2024-05-31 ENCOUNTER — Telehealth: Payer: Self-pay | Admitting: Family Medicine

## 2024-05-31 NOTE — Progress Notes (Unsigned)
 No chief complaint on file.   ache in lower left abdomen 2 or 3 weeks, dull    Last colonoscopy was 07/29/23 with Dr. Sandrea Cruel, mild diverticulosis in the left colon   Pelvic US  09/2023: IMPRESSION: 1. Fibroid uterus, similar in appearance to prior ultrasound. 2. Nonvisualization of the endometrium and ovaries.   PMH, PSH, SH reviewed   ROS:    PHYSICAL EXAM:  There were no vitals taken for this visit.  Wt Readings from Last 3 Encounters:  02/04/24 168 lb (76.2 kg)  12/14/23 162 lb (73.5 kg)  12/02/23 163 lb 6.4 oz (74.1 kg)       ASSESSMENT/PLAN:

## 2024-05-31 NOTE — Telephone Encounter (Signed)
 Copied from CRM [id number]. Topic: Appointments - Appointment Scheduling >> May 31, 2024  3:58 PM Kevelyn M wrote: Patient is experiencing an ache in lower left abdomen 2 or 3 weeks dull. Attempted to schedule an appointment but the times available were for next week and patient will be out of town. Patient would like to speak to [place].   Called pt, scheduled appt for tomorrow with Dr Monnie Anthony at 1:20

## 2024-06-01 ENCOUNTER — Ambulatory Visit: Admitting: Family Medicine

## 2024-06-01 ENCOUNTER — Encounter: Payer: Self-pay | Admitting: Family Medicine

## 2024-06-01 VITALS — BP 130/80 | HR 72 | Temp 98.4°F | Ht 66.0 in | Wt 162.8 lb

## 2024-06-01 DIAGNOSIS — R8290 Unspecified abnormal findings in urine: Secondary | ICD-10-CM | POA: Diagnosis not present

## 2024-06-01 DIAGNOSIS — R1030 Lower abdominal pain, unspecified: Secondary | ICD-10-CM

## 2024-06-01 LAB — POCT URINALYSIS DIP (PROADVANTAGE DEVICE)
Bilirubin, UA: NEGATIVE
Blood, UA: NEGATIVE
Glucose, UA: NEGATIVE mg/dL
Nitrite, UA: NEGATIVE
Protein Ur, POC: NEGATIVE mg/dL
Specific Gravity, Urine: 1.01
Urobilinogen, Ur: 0.2
pH, UA: 6 (ref 5.0–8.0)

## 2024-06-01 NOTE — Patient Instructions (Addendum)
 We are sending your urine for culture due to showing some white cells in the urine, in order to rule out infection. If culture shows infection, we will treat you with an antibiotic. You aren't having urinary symptoms, so I prefer to wait on the culture results. The area of your discomfort doesn't seem to be related to your colon, ovary, or other organs (these are nontender on exam). I can't rule out a musculoskeletal etiology. This seems to have started after your 2 weeks of exercising. Try some warm compresses, stretches, and some tylenol or ibuprofen  if needed.  If you have worsening pain, fever, blood in the stool ,or other changes, please return for re-evaluation.  We may need to do some labs, possibly imaging studies.  Continue to stay well hydrated, and eat a high fiber diet.

## 2024-06-02 LAB — URINE CULTURE: Organism ID, Bacteria: NO GROWTH

## 2024-06-03 ENCOUNTER — Ambulatory Visit: Payer: Self-pay | Admitting: Family Medicine

## 2024-07-14 DIAGNOSIS — H6123 Impacted cerumen, bilateral: Secondary | ICD-10-CM | POA: Diagnosis not present

## 2024-07-14 DIAGNOSIS — H9012 Conductive hearing loss, unilateral, left ear, with unrestricted hearing on the contralateral side: Secondary | ICD-10-CM | POA: Diagnosis not present

## 2024-07-14 DIAGNOSIS — H90A32 Mixed conductive and sensorineural hearing loss, unilateral, left ear with restricted hearing on the contralateral side: Secondary | ICD-10-CM | POA: Diagnosis not present

## 2024-08-04 DIAGNOSIS — L219 Seborrheic dermatitis, unspecified: Secondary | ICD-10-CM | POA: Diagnosis not present

## 2024-08-04 DIAGNOSIS — L658 Other specified nonscarring hair loss: Secondary | ICD-10-CM | POA: Diagnosis not present

## 2024-09-01 ENCOUNTER — Encounter: Payer: Self-pay | Admitting: Family Medicine

## 2024-09-29 ENCOUNTER — Encounter: Payer: Self-pay | Admitting: *Deleted

## 2024-10-03 DIAGNOSIS — L700 Acne vulgaris: Secondary | ICD-10-CM | POA: Diagnosis not present

## 2024-10-26 DIAGNOSIS — R92323 Mammographic fibroglandular density, bilateral breasts: Secondary | ICD-10-CM | POA: Diagnosis not present

## 2024-10-26 DIAGNOSIS — Z1231 Encounter for screening mammogram for malignant neoplasm of breast: Secondary | ICD-10-CM | POA: Diagnosis not present

## 2024-10-26 LAB — HM MAMMOGRAPHY

## 2024-10-28 ENCOUNTER — Encounter: Payer: Self-pay | Admitting: Family Medicine

## 2024-11-17 DIAGNOSIS — H9012 Conductive hearing loss, unilateral, left ear, with unrestricted hearing on the contralateral side: Secondary | ICD-10-CM | POA: Diagnosis not present

## 2024-11-28 ENCOUNTER — Encounter: Payer: Self-pay | Admitting: Family Medicine

## 2024-11-28 ENCOUNTER — Telehealth: Payer: Self-pay | Admitting: *Deleted

## 2024-11-28 ENCOUNTER — Telehealth: Admitting: Family Medicine

## 2024-11-28 VITALS — Temp 97.2°F | Ht 66.0 in | Wt 164.0 lb

## 2024-11-28 DIAGNOSIS — J069 Acute upper respiratory infection, unspecified: Secondary | ICD-10-CM

## 2024-11-28 DIAGNOSIS — R051 Acute cough: Secondary | ICD-10-CM | POA: Diagnosis not present

## 2024-11-28 LAB — POC COVID19 BINAXNOW: SARS Coronavirus 2 Ag: NEGATIVE

## 2024-11-28 LAB — POCT INFLUENZA A/B
Influenza A, POC: NEGATIVE
Influenza B, POC: NEGATIVE

## 2024-11-28 NOTE — Progress Notes (Signed)
 Start time: 3:36 End time: 3:55  Virtual Visit via Video Note  I connected with Laura Kirby on 11/28/24 by a video enabled telemedicine application and verified that I am speaking with the correct person using two identifiers.  Location: Patient: in car Provider: office   I discussed the limitations of evaluation and management by telemedicine and the availability of in person appointments. The patient expressed understanding and agreed to proceed.  History of Present Illness:  Chief Complaint  Patient presents with   Cough    VIRTUAL cough, chills, nausea and HA 4-5 days. No recent travel. No home testing done, patient is coming here for flu/covid.    11/25-26 she started feeling sick--cough, runny nose, head congestion. Her foster daughter had come home with a cold.  She was coughing a lot, almost gagging on some drainage.  3 days ago she started taking pseudoephedrine, which helped a lot with the cough.  She hasn't taken any sudafed today. She still has a headache, decreased appetite, and phlegm today. The phlegm is getting clearer, less yellow. It had been thick, but that seems better today. She has persistent fatigue. She took tylenol this morning for the dull headache. Headache is at the left frontal sinus. Ears periodically ache, lasting for 30 minutes. Both ears have bothered her, not usually at the same time. Denies ear pain currently.  Hasn't had any fever, feels cold but no chills. Body aches--mainly related to coughing (back, ribs).  She has been taking tylenol, theraflu, and sudafed (none today). She stopped the theraflu because it wasn't helping (took it wed/Thurs last week)--using daytime, which has acetaminophen and dextromethorphan, and nighttime, which has benadryl and tylenol.   PMH, PSH, SH reviewed  Outpatient Encounter Medications as of 11/28/2024  Medication Sig Note   Apoaequorin (PREVAGEN PO) Take 1 capsule by mouth daily.    Biotin 5000 MCG  CAPS Take 1 capsule by mouth daily.    Calcium Carbonate-Vit D-Min (CALCIUM 1200 PO) Take 1 each by mouth daily.    clobetasol ointment (TEMOVATE) 0.05 %  11/28/2024: As needed   cyproheptadine  (PERIACTIN ) 4 MG tablet Take 1 tablet (4 mg total) by mouth at bedtime as needed. for sleep    dexlansoprazole  (DEXILANT ) 60 MG capsule Take 1 capsule (60 mg total) by mouth daily.    escitalopram  (LEXAPRO ) 10 MG tablet Take 1 tablet (10 mg total) by mouth daily.    folic acid (FOLVITE) 400 MCG tablet Take 400 mcg by mouth daily.    glucosamine-chondroitin 500-400 MG tablet Take 1 tablet by mouth daily.    hydrocortisone  (ANUSOL -HC) 2.5 % rectal cream Use the cream topically  three times daily as needed for hemorrhoid flare 11/28/2024: As needed   loratadine (CLARITIN) 10 MG tablet Take 10 mg by mouth daily as needed for allergies. 11/28/2024: Started yesterday   Magnesium  500 MG TABS Take 1 tablet by mouth daily.    minoxidil (LONITEN) 2.5 MG tablet Take by mouth.    Multiple Vitamins-Minerals (AIRBORNE GUMMIES PO) Take 2 each by mouth as needed.    naproxen  sodium (ALEVE ) 220 MG tablet Take 220 mg by mouth. 11/28/2024: As needed   Probiotic Product (RESTORA) CAPS Take 1 capsule by mouth daily.    Turmeric 500 MG TABS Take 500 mg by mouth.    vitamin D , CHOLECALCIFEROL, 400 UNITS tablet Take 400 Units by mouth daily.    zinc gluconate 50 MG tablet Take 50 mg by mouth daily.    No facility-administered encounter medications  on file as of 11/28/2024.   Allergies  Allergen Reactions   Amoxicillin-Pot Clavulanate Diarrhea and Nausea Only     ROS:  no fever.  HA and URI symptoms per HPI. +fatigue +mild nausea, no vomiting.  Slightly loose stool one day, resolved. Denies rashes. Denies shortness of breath. Some back and rib pain related to coughing. Cough has improved since using decongestant.    Observations/Objective:  Temp (!) 97.2 F (36.2 C) (Oral)   Ht 5' 6 (1.676 m)   Wt 164 lb (74.4 kg)    SpO2 98%   BMI 26.47 kg/m   Pleasant, well-appearing female, in no distress. No coughing or throat clearing during visit, speaking comfortably. She is alert and oriented. Normal speech. Cranial nerves are grossly intact. Exam is limited due to virtual nature of the visit.  Influenza A&B and COVID tests were negative   Assessment and Plan:  Viral upper respiratory infection - supportive measures reviewed in detail. Seems to be starting to improve. Reviewed s/sx of bacterial secondary infection  Acute cough - Plan: POC COVID-19 BinaxNow, Influenza A/B  Stay well hydrated. Continue to use decongestants as needed. You might benefit from taking guaifenesin (found in Mucinex or Robitussin)--this is an expectorant that helps keep the mucus and phlegm thin.  Contact us  if/when you develop any fever, or worsening symptoms--sinus pain, discolored mucus or phlegm. Return to the office for evaluation if you have high fever, shortness of breath, pain with breathing, or other new concerns.   Follow Up Instructions:    I discussed the assessment and treatment plan with the patient. The patient was provided an opportunity to ask questions and all were answered. The patient agreed with the plan and demonstrated an understanding of the instructions.   The patient was advised to call back or seek an in-person evaluation if the symptoms worsen or if the condition fails to improve as anticipated.  I spent 22 minutes dedicated to the care of this patient, including pre-visit review of records, face to face time, post-visit ordering of testing and documentation.    Laura DELENA Fetters, MD

## 2024-11-28 NOTE — Patient Instructions (Signed)
 Stay well hydrated. Continue to use decongestants as needed. You might benefit from taking guaifenesin (found in Mucinex or Robitussin)--this is an expectorant that helps keep the mucus and phlegm thin.  Contact us  if/when you develop any fever, or worsening symptoms--sinus pain, discolored mucus or phlegm. Return to the office for evaluation if you have high fever, shortness of breath, pain with breathing, or other new concerns.

## 2024-11-28 NOTE — Telephone Encounter (Signed)
 Done

## 2024-12-07 ENCOUNTER — Ambulatory Visit: Admitting: Obstetrics and Gynecology

## 2024-12-30 NOTE — Progress Notes (Incomplete)
 "  69 y.o. G0P0000 postmenopausal female here for annual exam. Divorced. PCP: Randol Dawes, MD   She reports ***. Urine sample provided: ***  Postmenopausal bleeding: *** Pelvic discharge or pain: *** Breast mass, nipple discharge or skin changes : *** Sexually active: ***   Last PAP:     Component Value Date/Time   DIAGPAP  10/05/2023 0901    - Negative for intraepithelial lesion or malignancy (NILM)   DIAGPAP - Low grade squamous intraepithelial lesion (LSIL) (A) 01/26/2023 1142   HPVHIGH Negative 10/05/2023 0901   HPVHIGH Positive (A) 04/02/2023 1612   ADEQPAP  10/05/2023 0901    Satisfactory for evaluation; transformation zone component PRESENT.   ADEQPAP  01/26/2023 1142    Satisfactory for evaluation; transformation zone component PRESENT.   Last mammogram: 10/26/24 Birads 2 Last DXA: 01/07/24 *** Last colonoscopy: 07/29/23 q5y  Exercising: *** Smoker:***  Flowsheet Row Office Visit from 12/14/2023 in [facility]  PHQ-2 Total Score 0    Flowsheet Row Office Visit from 12/14/2023 in [facility]  PHQ-9 Total Score 0     GYN HISTORY: ***  OB History  Gravida Para Term Preterm AB Living  0 0 0 0 0 0  SAB IAB Ectopic Multiple Live Births  0 0 0 0    Past Medical History:  Diagnosis Date   Abnormal Pap smear of cervix    Anemia    hx of   Anxiety state, unspecified    on daily xanax  for a couple of years in past   Arthritis    L AC joint/shoulder   Cataract    not a surgical candidate at this time (07/06/2023)   Depression    Fibroid uterus    GYN--Dr. 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 repeat in 2 years   Ovarian cyst    Seasonal allergies    Sickle cell trait    Past Surgical History:  Procedure Laterality Date   COLONOSCOPY   2019   MD-MAC-prep good-TA   DILITATION & CURRETTAGE/HYSTROSCOPY WITH VERSAPOINT RESECTION N/A 09/02/2013   Procedure: DILATATION & CURETTAGE/HYSTEROSCOPY WITH VERSAPOINT RESECTION;  Surgeon: Percilla Burly, MD;  Location: [facility];  Service: Gynecology;  Laterality: N/A;   ENDOMETRIAL ABLATION     MYOMECTOMY     Dr. 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 In 06/2011.     Separated in 2017, in process of getting divorce.   Lives with her Schnoodle Merrianne) puppy 11/2017 (has issues with bladder stones).   Daughter is married, teaching, lives in [place].    Works for [employer] for [employer], doing astronomer).  Still teaching online course.   Fostering a 68yo girl Jobie)      Updated 11/2023   Social Drivers of Health   Tobacco Use: Low Risk (11/28/2024)   Patient History    Smoking Tobacco Use: Never    Smokeless Tobacco Use: Never    Passive Exposure: Not on file   Financial Resource Strain: Not on file  Food Insecurity: Not on file  Transportation Needs: Not on file  Physical Activity: Not on file  Stress: Not on file  Social Connections: Not on file  Intimate Partner Violence: Not on file  Depression (PHQ2-9): Low Risk (12/14/2023)   Depression (PHQ2-9)    PHQ-2 Score: 0  Alcohol Screen: Not on file  Housing: Not on file  Utilities: Not on file  Health Literacy: Not on file   Family History  Problem Relation Age of Onset   Hyperlipidemia Mother    Hypertension Mother    Diabetes Mother    Dementia Mother    Irritable bowel syndrome Mother    Stroke Mother 101   Alcohol abuse Father    Kidney disease Father    Ulcers Father    Cancer Maternal Aunt        breast cancer (60's)   Colon cancer Maternal Uncle 26   Colon polyps Maternal Uncle 65   Cancer Paternal Aunt        ?unsure of type (stomach vs ovarian)   Heart disease Maternal Grandmother    Diabetes Maternal Grandfather    Stroke Maternal Grandfather    Allergies Daughter    Eczema Daughter    Esophageal cancer Neg Hx    Rectal cancer Neg Hx    Stomach cancer Neg Hx    Allergies[2]    PE There were no vitals filed for this visit. There is no height or weight on file to calculate BMI.  Physical Exam    Assessment and Plan:        There are no diagnoses linked to this encounter. Clotilda FORBES Pa, CMA      [1]  Current Outpatient Medications on File Prior to Visit  Medication Sig Dispense Refill   Apoaequorin (PREVAGEN PO) Take 1 capsule by mouth daily.     Biotin 5000 MCG CAPS Take 1 capsule by mouth daily.     Calcium Carbonate-Vit D-Min (CALCIUM 1200 PO) Take 1 each by mouth daily.     clobetasol ointment (TEMOVATE) 0.05 %      cyproheptadine  (PERIACTIN ) 4 MG tablet Take 1 tablet (4 mg total) by mouth at bedtime as needed. for sleep 90 tablet 3   dexlansoprazole  (DEXILANT ) 60 MG capsule Take 1 capsule (60 mg total) by mouth daily. 30 capsule 11    escitalopram  (LEXAPRO ) 10 MG tablet Take 1 tablet (10 mg total) by mouth daily. 90 tablet 3   folic acid (FOLVITE) 400 MCG tablet Take 400 mcg by mouth daily.     glucosamine-chondroitin 500-400 MG tablet Take 1 tablet by mouth daily.  hydrocortisone  (ANUSOL -HC) 2.5 % rectal cream Use the cream topically  three times daily as needed for hemorrhoid flare 30 g 1   loratadine (CLARITIN) 10 MG tablet Take 10 mg by mouth daily as needed for allergies.     Magnesium  500 MG TABS Take 1 tablet by mouth daily.     minoxidil (LONITEN) 2.5 MG tablet Take by mouth.     Multiple Vitamins-Minerals (AIRBORNE GUMMIES PO) Take 2 each by mouth as needed.     naproxen  sodium (ALEVE ) 220 MG tablet Take 220 mg by mouth.     Probiotic Product (RESTORA) CAPS Take 1 capsule by mouth daily.     Turmeric 500 MG TABS Take 500 mg by mouth.     vitamin D , CHOLECALCIFEROL, 400 UNITS tablet Take 400 Units by mouth daily.     zinc gluconate 50 MG tablet Take 50 mg by mouth daily.     No current facility-administered medications on file prior to visit.  [2]  Allergies Allergen Reactions   Amoxicillin-Pot Clavulanate Diarrhea and Nausea Only   "

## 2025-01-02 ENCOUNTER — Ambulatory Visit: Admitting: Obstetrics and Gynecology

## 2025-01-02 ENCOUNTER — Other Ambulatory Visit: Payer: Self-pay | Admitting: Family Medicine

## 2025-01-02 ENCOUNTER — Telehealth: Payer: Self-pay | Admitting: *Deleted

## 2025-01-02 DIAGNOSIS — Z0000 Encounter for general adult medical examination without abnormal findings: Secondary | ICD-10-CM

## 2025-01-02 DIAGNOSIS — F411 Generalized anxiety disorder: Secondary | ICD-10-CM

## 2025-01-02 DIAGNOSIS — N1831 Chronic kidney disease, stage 3a: Secondary | ICD-10-CM

## 2025-01-02 DIAGNOSIS — F325 Major depressive disorder, single episode, in full remission: Secondary | ICD-10-CM

## 2025-01-02 DIAGNOSIS — L853 Xerosis cutis: Secondary | ICD-10-CM

## 2025-01-02 DIAGNOSIS — G4700 Insomnia, unspecified: Secondary | ICD-10-CM

## 2025-01-02 DIAGNOSIS — E7800 Pure hypercholesterolemia, unspecified: Secondary | ICD-10-CM

## 2025-01-02 DIAGNOSIS — Z5181 Encounter for therapeutic drug level monitoring: Secondary | ICD-10-CM

## 2025-01-02 DIAGNOSIS — R635 Abnormal weight gain: Secondary | ICD-10-CM

## 2025-01-02 NOTE — Telephone Encounter (Signed)
 Orders entered. Last TSH was in 2018, but I would need a symptom/diagnosis to order that was well.  Feel free to add if she has symptoms (weight gain, fatigue, etc)

## 2025-01-02 NOTE — Telephone Encounter (Signed)
 Patient has CPE at 2pm 01/05/25 would like to come for labs prior. Need orders please.

## 2025-01-03 NOTE — Telephone Encounter (Signed)
 Patient scheduled for 01/04/25 and I added TSH.

## 2025-01-04 ENCOUNTER — Other Ambulatory Visit: Payer: Self-pay | Admitting: *Deleted

## 2025-01-04 ENCOUNTER — Other Ambulatory Visit

## 2025-01-04 DIAGNOSIS — E7800 Pure hypercholesterolemia, unspecified: Secondary | ICD-10-CM

## 2025-01-04 DIAGNOSIS — N1831 Chronic kidney disease, stage 3a: Secondary | ICD-10-CM

## 2025-01-04 DIAGNOSIS — Z5181 Encounter for therapeutic drug level monitoring: Secondary | ICD-10-CM

## 2025-01-04 DIAGNOSIS — L853 Xerosis cutis: Secondary | ICD-10-CM

## 2025-01-04 DIAGNOSIS — R635 Abnormal weight gain: Secondary | ICD-10-CM

## 2025-01-04 DIAGNOSIS — Z0000 Encounter for general adult medical examination without abnormal findings: Secondary | ICD-10-CM

## 2025-01-04 LAB — CBC WITH DIFFERENTIAL/PLATELET
Basophils Absolute: 0 x10E3/uL (ref 0.0–0.2)
Basos: 1 %
EOS (ABSOLUTE): 0.2 x10E3/uL (ref 0.0–0.4)
Eos: 4 %
Hematocrit: 42.6 % (ref 34.0–46.6)
Hemoglobin: 13.7 g/dL (ref 11.1–15.9)
Immature Grans (Abs): 0 x10E3/uL (ref 0.0–0.1)
Immature Granulocytes: 0 %
Lymphocytes Absolute: 1.1 x10E3/uL (ref 0.7–3.1)
Lymphs: 21 %
MCH: 29.5 pg (ref 26.6–33.0)
MCHC: 32.2 g/dL (ref 31.5–35.7)
MCV: 92 fL (ref 79–97)
Monocytes Absolute: 0.4 x10E3/uL (ref 0.1–0.9)
Monocytes: 8 %
Neutrophils Absolute: 3.7 x10E3/uL (ref 1.4–7.0)
Neutrophils: 66 %
Platelets: 222 x10E3/uL (ref 150–450)
RBC: 4.64 x10E6/uL (ref 3.77–5.28)
RDW: 13.6 % (ref 11.7–15.4)
WBC: 5.5 x10E3/uL (ref 3.4–10.8)

## 2025-01-04 LAB — CMP14+EGFR
ALT: 11 IU/L (ref 0–32)
AST: 13 IU/L (ref 0–40)
Albumin: 4 g/dL (ref 3.9–4.9)
Alkaline Phosphatase: 66 IU/L (ref 49–135)
BUN/Creatinine Ratio: 13 (ref 12–28)
BUN: 12 mg/dL (ref 8–27)
Bilirubin Total: 0.4 mg/dL (ref 0.0–1.2)
CO2: 22 mmol/L (ref 20–29)
Calcium: 9.2 mg/dL (ref 8.7–10.3)
Chloride: 109 mmol/L — ABNORMAL HIGH (ref 96–106)
Creatinine, Ser: 0.96 mg/dL (ref 0.57–1.00)
Globulin, Total: 2.6 g/dL (ref 1.5–4.5)
Glucose: 89 mg/dL (ref 70–99)
Potassium: 4.5 mmol/L (ref 3.5–5.2)
Sodium: 143 mmol/L (ref 134–144)
Total Protein: 6.6 g/dL (ref 6.0–8.5)
eGFR: 65 mL/min/1.73

## 2025-01-04 LAB — LIPID PANEL
Chol/HDL Ratio: 4.3 ratio (ref 0.0–4.4)
Cholesterol, Total: 203 mg/dL — ABNORMAL HIGH (ref 100–199)
HDL: 47 mg/dL
LDL Chol Calc (NIH): 133 mg/dL — ABNORMAL HIGH (ref 0–99)
Triglycerides: 131 mg/dL (ref 0–149)
VLDL Cholesterol Cal: 23 mg/dL (ref 5–40)

## 2025-01-04 LAB — TSH: TSH: 2.82 u[IU]/mL (ref 0.450–4.500)

## 2025-01-04 NOTE — Patient Instructions (Addendum)
" °  HEALTH MAINTENANCE RECOMMENDATIONS:  It is recommended that you get at least 30 minutes of aerobic exercise at least 5 days/week (for weight loss, you may need as much as 60-90 minutes). This can be any activity that gets your heart rate up. This can be divided in 10-15 minute intervals if needed, but try and build up your endurance at least once a week.  Weight bearing exercise is also recommended twice weekly.  Eat a healthy diet with lots of vegetables, fruits and fiber.  Colorful foods have a lot of vitamins (ie green vegetables, tomatoes, red peppers, etc).  Limit sweet tea, regular sodas and alcoholic beverages, all of which has a lot of calories and sugar.  Up to 1 alcoholic drink daily may be beneficial for women (unless trying to lose weight, watch sugars).  Drink a lot of water.  Calcium recommendations are 1200-1500 mg daily (1500 mg for postmenopausal women or women without ovaries), and vitamin D  1000 IU daily.  This should be obtained from diet and/or supplements (vitamins), and calcium should not be taken all at once, but in divided doses.  Monthly self breast exams and yearly mammograms for women over the age of 54 is recommended.  Sunscreen of at least SPF 30 should be used on all sun-exposed parts of the skin when outside between the hours of 10 am and 4 pm (not just when at beach or pool, but even with exercise, golf, tennis, and yard work!)  Use a sunscreen that says broad spectrum so it covers both UVA and UVB rays, and make sure to reapply every 1-2 hours.  Remember to change the batteries in your smoke detectors when changing your clock times in the spring and fall. Carbon monoxide detectors are recommended for your home.  Use your seat belt every time you are in a car, and please drive safely and not be distracted with cell phones and texting while driving.  Please bring us  copies of your Living Will and Healthcare Power of Attorney once completed and notarized so that  it can be scanned into your medical chart. I encourage you to get these completed. New forms were given today.  If you are getting a new will, you can likely get this done with your attorney (and get us  a copy of the medical portion).   "

## 2025-01-04 NOTE — Progress Notes (Signed)
 " Chief Complaint  Patient presents with   Annual Exam    Nonfasting annual exam. Ok for covid booster. No new concerns.    Laura Kirby is a 69 y.o. female who presents for a complete physical. She is under the care of GYN, is scheduled for visit next month. See below for labs done prior to visit.  Her daughter had a daughter in late [place] are doing very well.  Her foster daughter Xenia was excited, but now that Beryl Ee is here, she is a little jealous of the attention she gets.  She has been seeing audiologist, and trying different hearing aids for the left ear.  She has trouble keeping them in, as her canals are very small. She doesn't use it regularly.  Hyperlipidemia follow-up:  She tries to follow a low-fat, low cholesterol diet. Her cholesterol has been controlled by diet in the past. LDL was up to 172 four years ago, and improved by cutting back on cheese. She had calcium score of 0 in 11/2021.   She reports eating a lot more cheese, and egg yolks, over the holidays, and being more sedentary. Still vegetarian.   She isn't having pizza often (vegan when she has it), no fast foods.   Component Ref Range & Units (hover) 1 yr ago 2 yr ago 3 yr ago 4 yr ago 5 yr ago 6 yr ago 7 yr ago  Cholesterol, Total 195 217 High  228 High  252 High  231 High  220 High    Triglycerides 82 96 101 104 111 107 88 R  HDL 51 61 53 62 60 56 59 R  VLDL Cholesterol Cal 15 17 18 18 20 21    LDL Chol Calc (NIH) 129 High  139 High  157 High  172 High  151 High     Chol/HDL Ratio 3.8 3.6 CM 4.3 CM 4.1 CM 3.9 CM 3.9 CM 3.6 R    Depression/anxiety: This has been controlled on lexapro , denies side effects. She has some stress related to her foster child (tween), and related to property with ex-husband, but overall moods have been okay.    GERD: She used to take Dexilant  daily, but due to cost, tapered back to three times/week, and this remains effective.  Denies dysphagia.  No breakthrough  heartburn, even with eating pizza.   Headaches: Continues to only get headaches infrequently, related to stress, and sometimes seasons. Previously relieved by Vanquish as needed, but no longer available. She uses Tylenol prn with good results, only needing a few times/year.  She continues to do well in this regard.    Insomnia: Takes periactin  nightly to help with sleep. She has some seasonal allergies, and uses claritin as needed (mainly in the Spring, though also using it now).  H/o anal fissures and hemorrhoids.  She hasn't had issues in a while. Anusol  is effective, uses prn for external hemorrhoids.  Overall does well when taking stool softeners and Restora. Has used diltiazem  gel in the past for fissures, not in a long time.   Immunization History  Administered Date(s) Administered   PFIZER Comirnaty(Gray Top)Covid-19 Tri-Sucrose Vaccine 07/16/2021   PFIZER(Purple Top)SARS-COV-2 Vaccination 03/24/2020, 04/14/2020, 11/01/2020   PNEUMOCOCCAL CONJUGATE-20 11/06/2023   Pfizer Covid-19 Vaccine Bivalent Booster 82yrs & up 11/11/2021   Pfizer(Comirnaty)Fall Seasonal Vaccine 12 years and older 11/19/2022, 10/07/2023   Respiratory Syncytial Virus Vaccine,Recomb Aduvanted(Arexvy) 12/27/2022   Td 11/17/2005   Tdap 10/18/2011, 12/14/2023   Zoster Recombinant(Shingrix ) 09/29/2017, 12/02/2017   Refuses  flu shots Last Pap smear:  09/2023 normal, negative HR HPV (prior +HPV 11/2020) Last mammogram: 09/2024 Last colonoscopy:  07/29/23 with Dr. Aneita, mild diverticulosis in the left colon. 5 year f/u recommended (prior tubular adenoma 06/2018) Last DEXA: 12/2023 T-2.2 spine, -2.2 L fem neck, normal FRAX Dentist: twice a year Ophtho: yearly Exercise:  Hasn't gotten any exercise since August. Prior exercise routine included squats, sit-ups, exercise bike.  She hasn't been using any weights (has 3 and 10# weights at home). She is planning to join a gym.  Vitamin D  level was normal at 48 in 2012. She  denies changes in vitamins  Other doctors: GI: Dr. Aneita (retired) GYN: Dr. Glennon Ophtho: Dr. Glendia Dentist: [facility] Derm: Dr. Mollie Plastic surgery (possibly going to get Juvederm)--[facility] ENT: Dr. Terri (hearing loss)   PMH, PSH, SH and FH were reviewed and updated.  Outpatient Encounter Medications as of 01/05/2025  Medication Sig Note   Apoaequorin (PREVAGEN PO) Take 1 capsule by mouth daily.    Biotin 5000 MCG CAPS Take 1 capsule by mouth daily.    Calcium Carbonate-Vit D-Min (CALCIUM 1200 PO) Take 1 each by mouth daily.    clobetasol ointment (TEMOVATE) 0.05 %  01/05/2025: As needed   cyproheptadine  (PERIACTIN ) 4 MG tablet TAKE 1 TABLET BY MOUTH AT BEDTIME AS NEEDED FOR SLEEP    dexlansoprazole  (DEXILANT ) 60 MG capsule Take 1 capsule (60 mg total) by mouth daily. 01/05/2025: Takes it 3x/week   escitalopram  (LEXAPRO ) 10 MG tablet Take 1 tablet by mouth once daily    folic acid (FOLVITE) 400 MCG tablet Take 400 mcg by mouth daily.    glucosamine-chondroitin 500-400 MG tablet Take 1 tablet by mouth daily.    hydrocortisone  (ANUSOL -HC) 2.5 % rectal cream Use the cream topically  three times daily as needed for hemorrhoid flare 01/05/2025: As needed   loratadine (CLARITIN) 10 MG tablet Take 10 mg by mouth daily as needed for allergies. 01/05/2025: daily   Magnesium  500 MG TABS Take 1 tablet by mouth daily.    minoxidil (LONITEN) 2.5 MG tablet Take by mouth.    Multiple Vitamins-Minerals (AIRBORNE GUMMIES PO) Take 2 each by mouth as needed.    naproxen  sodium (ALEVE ) 220 MG tablet Take 220 mg by mouth. 01/05/2025: As needed   Probiotic Product (RESTORA) CAPS Take 1 capsule by mouth daily.    Turmeric 500 MG TABS Take 500 mg by mouth.    vitamin D , CHOLECALCIFEROL, 400 UNITS tablet Take 400 Units by mouth daily.    zinc gluconate 50 MG tablet Take 50 mg by mouth daily.    No facility-administered encounter medications on file as of 01/05/2025.   Allergies  Allergen  Reactions   Amoxicillin-Pot Clavulanate Diarrhea and Nausea Only    ROS: The patient denies fever, vision changes, weight changes, sore throat, breast concerns, chest pain, palpitations, dizziness, syncope, dyspnea on exertion, cough, swelling, nausea, vomiting, diarrhea, constipation, abdominal pain, melena, hematochezia, indigestion/heartburn.  Denies numbness, tingling, weakness, tremor, suspicious skin lesions.    L hearing loss--trying different hearing aids, not doing well with them, they don't stay in. Moods are good, insomnia controlled with medication.  Intermittent constipation and fissures, well controlled now. Hair loss is improving with use of minoxidil from derm. Acne is treated by derm Occ mild back pain.    PHYSICAL EXAM:  BP 124/84   Pulse 72   Ht 5' 6 (1.676 m)   Wt 170 lb 12.8 oz (77.5 kg)   BMI  27.57 kg/m   Wt Readings from Last 3 Encounters:  01/05/25 170 lb 12.8 oz (77.5 kg)  11/28/24 164 lb (74.4 kg)  06/01/24 162 lb 12.8 oz (73.8 kg)    General Appearance:    Alert, cooperative, no distress, appears stated age     Head:    Normocephalic, without obvious abnormality, atraumatic     Eyes:    PERRL, conjunctiva/corneas clear, EOM's intact, fundi benign.   Ears:    Normal TM's and external ear canals--Narrower canal on the left, only small amount of cerumen  Nose:    No nasal drainage or sinus tenderness  Throat:    Normal mucosa  Neck:    Supple, no lymphadenopathy; thyroid: no enlargement/ tenderness/nodules; no carotid bruit or JVD     Back:    Spine nontender, no curvature or CVA tenderness    Lungs:    Clear to auscultation bilaterally without wheezes, rales or ronchi; respirations unlabored     Chest Wall:    No tenderness or deformity     Heart:    Regular rate and rhythm, S1 and S2 normal, no murmur, rub or gallop     Breast Exam:    Deferred to GYN     Abdomen:    Soft, nondistended, nontender, normoactive bowel sounds, no masses, no  hepatosplenomegaly.  Genitalia:    Deferred to GYN     Rectal:    Deferred to GYN  Extremities:    No clubbing, cyanosis or edema. 2+ PT and DP pulses.   Pulses:    2+ and symmetric all extremities     Skin:    Skin color, texture, turgor normal, no rashes or lesions.   Lymph nodes:    Cervical, inguinal and supraclavicular nodes normal     Neurologic:    Normal strength, sensation and gait; reflexes 2+ and symmetric throughout                          Psych:  Normal mood, affect, hygiene and grooming      01/05/2025    1:51 PM 12/14/2023    8:35 AM 11/19/2022    9:44 AM 11/11/2021    9:56 AM 06/05/2021    2:05 PM  Depression screen PHQ 2/9  Decreased Interest 0 0 0 0 0  Down, Depressed, Hopeless 0 0 0 0 0  PHQ - 2 Score 0 0 0 0 0  Altered sleeping 0 0 0 0 0  Tired, decreased energy 0 0 0 0 0  Change in appetite 0 0 0 0 0  Feeling bad or failure about yourself  0 0 0 0 0  Trouble concentrating 0 0 0 1 0  Moving slowly or fidgety/restless 0 0 0 0 0  Suicidal thoughts 0 0 0 0 0  PHQ-9 Score 0 0  0  1  0   Difficult doing work/chores Not difficult at all Not difficult at all Not difficult at all Not difficult at all      Data saved with a previous flowsheet row definition      01/05/2025    1:51 PM 06/05/2021    2:06 PM  GAD 7 : Generalized Anxiety Score  Nervous, Anxious, on Edge 0 0  Control/stop worrying 0 0  Worry too much - different things 0 1  Trouble relaxing 0 0  Restless 0 0  Easily annoyed or irritable 0 0  Afraid - awful might happen 0  0  Total GAD 7 Score 0 1  Anxiety Difficulty Not difficult at all    Lab Results  Component Value Date   CHOL 203 (H) 01/04/2025   HDL 47 01/04/2025   LDLCALC 133 (H) 01/04/2025   TRIG 131 01/04/2025   CHOLHDL 4.3 01/04/2025   Lab Results  Component Value Date   WBC 5.5 01/04/2025   HGB 13.7 01/04/2025   HCT 42.6 01/04/2025   MCV 92 01/04/2025   PLT 222 01/04/2025     Chemistry      Component Value Date/Time   NA 143  01/04/2025 0847   K 4.5 01/04/2025 0847   CL 109 (H) 01/04/2025 0847   CO2 22 01/04/2025 0847   BUN 12 01/04/2025 0847   CREATININE 0.96 01/04/2025 0847   CREATININE 1.30 (H) 06/22/2017 0833      Component Value Date/Time   CALCIUM 9.2 01/04/2025 0847   ALKPHOS 66 01/04/2025 0847   AST 13 01/04/2025 0847   ALT 11 01/04/2025 0847   BILITOT 0.4 01/04/2025 0847     Fasting glucose 89  Lab Results  Component Value Date   TSH 2.820 01/04/2025     ASSESSMENT/PLAN:  Annual physical exam  Pure hypercholesterolemia - reviewed lipids, HDL lower.  Reviewed low cholesterol diet and regular exercise  Anxiety state - controlled.  Depression, major, in remission - controlled.  Still has some stressors, continue current med  Gastroesophageal reflux disease without esophagitis - continue Dexilant  3x/week  Insomnia, unspecified type - controlled on current regimen  Need for COVID-19 vaccine - Plan: Pfizer Comirnaty Covid-19 Vaccine 67yrs & older  Discussed monthly self breast exams and yearly mammograms; at least 30 minutes of aerobic activity at least 5 days/week, weight-bearing exercise at least 2x/week; proper sunscreen use reviewed; healthy diet, including goals of calcium and vitamin D  intake and alcohol recommendations (less than or equal to 1 drink/day) reviewed; regular seatbelt use; changing batteries in smoke detectors. Immunization recommendations discussed--she declines flu shots, encouraged to get yearly. COVID booster given today. Colonoscopy recommendations reviewed--UTD, due again in 06/2028.  DEXA recommended--ordered by GYN, reminded to schedule.  Living Will and healthcare power of attorney was discussed, forms given again today, but if she is meeting with her attorney to do her Will, she likely can also get this done through them. Encouraged her to get us  copies of completed forms.  F/u 1 year, sooner prn  "

## 2025-01-05 ENCOUNTER — Encounter: Payer: Self-pay | Admitting: Family Medicine

## 2025-01-05 ENCOUNTER — Ambulatory Visit (INDEPENDENT_AMBULATORY_CARE_PROVIDER_SITE_OTHER): Payer: Self-pay | Admitting: Family Medicine

## 2025-01-05 VITALS — BP 124/84 | HR 72 | Ht 66.0 in | Wt 170.8 lb

## 2025-01-05 DIAGNOSIS — K219 Gastro-esophageal reflux disease without esophagitis: Secondary | ICD-10-CM | POA: Diagnosis not present

## 2025-01-05 DIAGNOSIS — Z0000 Encounter for general adult medical examination without abnormal findings: Secondary | ICD-10-CM

## 2025-01-05 DIAGNOSIS — F411 Generalized anxiety disorder: Secondary | ICD-10-CM

## 2025-01-05 DIAGNOSIS — Z23 Encounter for immunization: Secondary | ICD-10-CM | POA: Diagnosis not present

## 2025-01-05 DIAGNOSIS — F325 Major depressive disorder, single episode, in full remission: Secondary | ICD-10-CM | POA: Diagnosis not present

## 2025-01-05 DIAGNOSIS — E7800 Pure hypercholesterolemia, unspecified: Secondary | ICD-10-CM

## 2025-01-05 DIAGNOSIS — N1831 Chronic kidney disease, stage 3a: Secondary | ICD-10-CM

## 2025-01-05 DIAGNOSIS — G4700 Insomnia, unspecified: Secondary | ICD-10-CM

## 2025-01-16 ENCOUNTER — Other Ambulatory Visit: Payer: Self-pay | Admitting: Family Medicine

## 2025-01-16 DIAGNOSIS — K219 Gastro-esophageal reflux disease without esophagitis: Secondary | ICD-10-CM

## 2025-02-09 ENCOUNTER — Ambulatory Visit: Admitting: Obstetrics and Gynecology

## 2025-03-06 ENCOUNTER — Ambulatory Visit: Admitting: Obstetrics and Gynecology

## 2025-12-14 ENCOUNTER — Encounter: Payer: BC Managed Care – PPO | Admitting: Family Medicine

## 2026-01-24 ENCOUNTER — Encounter: Admitting: Family Medicine
